# Patient Record
Sex: Female | Born: 1940 | Race: Black or African American | Hispanic: No | Marital: Single | Smoking: Former smoker
Health system: Southern US, Community
[De-identification: ages and names within clinical notes are randomized; demographics above are authoritative.]

## PROBLEM LIST (undated history)

## (undated) DIAGNOSIS — I639 Cerebral infarction, unspecified: Secondary | ICD-10-CM

## (undated) DIAGNOSIS — M1990 Unspecified osteoarthritis, unspecified site: Secondary | ICD-10-CM

## (undated) DIAGNOSIS — I10 Essential (primary) hypertension: Secondary | ICD-10-CM

## (undated) DIAGNOSIS — K219 Gastro-esophageal reflux disease without esophagitis: Secondary | ICD-10-CM

## (undated) DIAGNOSIS — G459 Transient cerebral ischemic attack, unspecified: Secondary | ICD-10-CM

## (undated) DIAGNOSIS — E785 Hyperlipidemia, unspecified: Secondary | ICD-10-CM

## (undated) DIAGNOSIS — E119 Type 2 diabetes mellitus without complications: Secondary | ICD-10-CM

## (undated) DIAGNOSIS — C801 Malignant (primary) neoplasm, unspecified: Secondary | ICD-10-CM

## (undated) DIAGNOSIS — I350 Nonrheumatic aortic (valve) stenosis: Secondary | ICD-10-CM

## (undated) DIAGNOSIS — R011 Cardiac murmur, unspecified: Secondary | ICD-10-CM

## (undated) HISTORY — DX: Nonrheumatic aortic (valve) stenosis: I35.0

## (undated) HISTORY — DX: Essential (primary) hypertension: I10

## (undated) HISTORY — DX: Transient cerebral ischemic attack, unspecified: G45.9

## (undated) HISTORY — PX: ABDOMINAL HYSTERECTOMY: SHX81

## (undated) HISTORY — DX: Type 2 diabetes mellitus without complications: E11.9

## (undated) HISTORY — PX: OTHER SURGICAL HISTORY: SHX169

## (undated) HISTORY — DX: Hyperlipidemia, unspecified: E78.5

## (undated) HISTORY — PX: COLON SURGERY: SHX602

## (undated) HISTORY — PX: TOTAL KNEE ARTHROPLASTY: SHX125

---

## 2008-10-17 ENCOUNTER — Emergency Department (HOSPITAL_COMMUNITY): Admission: EM | Admit: 2008-10-17 | Discharge: 2008-10-17 | Payer: Self-pay | Admitting: Emergency Medicine

## 2019-12-22 ENCOUNTER — Other Ambulatory Visit: Payer: Self-pay | Admitting: Internal Medicine

## 2019-12-22 DIAGNOSIS — N6459 Other signs and symptoms in breast: Secondary | ICD-10-CM

## 2019-12-29 ENCOUNTER — Other Ambulatory Visit: Payer: Self-pay | Admitting: Internal Medicine

## 2019-12-29 DIAGNOSIS — N6459 Other signs and symptoms in breast: Secondary | ICD-10-CM

## 2020-02-08 ENCOUNTER — Encounter: Payer: Self-pay | Admitting: Cardiology

## 2020-02-08 ENCOUNTER — Ambulatory Visit: Payer: Medicare Other | Admitting: Cardiology

## 2020-02-08 ENCOUNTER — Other Ambulatory Visit: Payer: Self-pay

## 2020-02-08 VITALS — BP 137/62 | HR 72 | Resp 16 | Ht 62.0 in | Wt 159.0 lb

## 2020-02-08 DIAGNOSIS — R0600 Dyspnea, unspecified: Secondary | ICD-10-CM

## 2020-02-08 DIAGNOSIS — I493 Ventricular premature depolarization: Secondary | ICD-10-CM

## 2020-02-08 DIAGNOSIS — Z87891 Personal history of nicotine dependence: Secondary | ICD-10-CM

## 2020-02-08 DIAGNOSIS — I350 Nonrheumatic aortic (valve) stenosis: Secondary | ICD-10-CM

## 2020-02-08 DIAGNOSIS — R0609 Other forms of dyspnea: Secondary | ICD-10-CM

## 2020-02-08 DIAGNOSIS — I10 Essential (primary) hypertension: Secondary | ICD-10-CM

## 2020-02-08 DIAGNOSIS — E782 Mixed hyperlipidemia: Secondary | ICD-10-CM

## 2020-02-08 DIAGNOSIS — Z8673 Personal history of transient ischemic attack (TIA), and cerebral infarction without residual deficits: Secondary | ICD-10-CM

## 2020-02-08 DIAGNOSIS — R072 Precordial pain: Secondary | ICD-10-CM

## 2020-02-08 DIAGNOSIS — E119 Type 2 diabetes mellitus without complications: Secondary | ICD-10-CM

## 2020-02-08 MED ORDER — LOSARTAN POTASSIUM 25 MG PO TABS: 25.0000 mg | ORAL_TABLET | Freq: Every morning | ORAL | 0 refills | Status: DC

## 2020-02-08 MED ORDER — METOPROLOL SUCCINATE ER 25 MG PO TB24: 25.0000 mg | ORAL_TABLET | Freq: Every day | ORAL | 0 refills | Status: DC

## 2020-02-08 NOTE — Progress Notes (Signed)
Date:  02/08/2020   ID:  Jacqueline Rich, DOB [birth year], MRN [id number]  PCP:  [name], MD  Cardiologist:  [name], DO, [facility] (established care 02/08/2020) Former Cardiology Providers: Dr. Sol Blazing [facility], [place])  REASON FOR CONSULT: Chest discomfort, aortic stenosis, dyspnea on exertion   REQUESTING PHYSICIAN:  [name], MD [address] [place],  [place] [address]  Chief Complaint  Patient presents with  . Chest Discomfort  . New Patient (Initial Visit)    Referred by r. Varadarajan  . Shortness of Breath    HPI  Jacqueline Rich is a 79 y.o. female who presents to the office with a chief complaint of " chest discomfort and shortness of breath." Patient's past medical history and cardiovascular risk factors include: Hypertension, type 2 diabetes without insulin use, hyperlipidemia, former smoker, TIA, family history of CHF, sedentary lifestyle.  She is referred to the office at the request of [name],* for evaluation of aortic stenosis, chest discomfort, dyspnea on exertion.   Patient presents today to establish cardiology care in [place] as she moved from [place] in March 2021. She moved to [facility] in order to be closer to her sisters.  She was reportedly being followed by cardiologist Dr. Sol Blazing.   Patient complains today of occasional substernal chest discomfort 1-2 times per week over the last month.  She rates this 1/10 pain and reports it comes on with regular activity last for a few seconds and is usually self-limited.  She was also recently seen by her primary care provider with concern of dyspnea on exertion.  Patient has no formal exercise routine but does state she experiences shortness of breath when she exerts herself beyond usual activity.  Denies syncope or near syncope.  Patient brings with her today and external echo report, without images, stating she has severe aortic stenosis.  According  to patient her previous cardiologist in [place] recommended evaluation for aortic valve surgery, however patient never followed up with a surgeon.   History of TIA according to patient in 2020 when she temporarily lost vision.   FUNCTIONAL STATUS: No structured exercise or daily routine.   ALLERGIES: No Known Allergies  MEDICATION LIST PRIOR TO VISIT: Current Meds  Medication Sig  . atorvastatin (LIPITOR) 10 MG tablet Take 1 tablet by mouth at bedtime.  Marland Kitchen DILT-XR 240 MG 24 hr capsule Take 240 mg by mouth daily.  Marland Kitchen glimepiride (AMARYL) 1 MG tablet Take 1 tablet by mouth daily.  . Lancets (ONETOUCH DELICA PLUS LANCET33G) MISC Apply 1 each topically daily.  . lansoprazole (PREVACID) 30 MG capsule Take 30 mg by mouth daily.  Letta Pate ULTRA test strip 1 each daily.  . potassium chloride SA (KLOR-CON) 20 MEQ tablet Take 20 mEq by mouth daily.  . [DISCONTINUED] hydrochlorothiazide (HYDRODIURIL) 25 MG tablet Take 1 tablet by mouth daily.  . [DISCONTINUED] labetalol (NORMODYNE) 100 MG tablet Take 1 tablet by mouth daily.     PAST MEDICAL HISTORY: Past Medical History:  Diagnosis Date  . Aortic stenosis   . Diabetes mellitus without complication (HCC)   . Hyperlipidemia   . Hypertension   . TIA (transient ischemic attack)     PAST SURGICAL HISTORY: History reviewed. No pertinent surgical history.  FAMILY HISTORY: The patient family history includes Cancer in her brother; Cancer - Colon in her mother; Cancer - Lung in her brother; Heart attack in her brother; Hypertension in her brother, father, and sister.  SOCIAL HISTORY:  The patient  reports that she quit smoking about 31 years ago. Her smoking use included cigarettes. She has never used smokeless tobacco. She reports current alcohol use of about 2.0 standard drinks of alcohol per week. She reports that she does not use drugs.  REVIEW OF SYSTEMS: Review of Systems  Constitutional: Negative for malaise/fatigue.   Cardiovascular: Positive for chest pain, dyspnea on exertion and palpitations. Negative for leg swelling, near-syncope, orthopnea, paroxysmal nocturnal dyspnea and syncope.  Respiratory: Negative for snoring.   Hematologic/Lymphatic: Negative for bleeding problem. Does not bruise/bleed easily.  Gastrointestinal: Negative for melena.    PHYSICAL EXAM: Vitals with BMI 02/08/2020  Height 5\' 2"   Weight 159 lbs  BMI 29.07  Systolic 137  Diastolic 62  Pulse 72    CONSTITUTIONAL: Well-developed and well-nourished. No acute distress.  SKIN: Skin is warm and dry. No rash noted. No cyanosis. No pallor. No jaundice HEAD: Normocephalic and atraumatic.  EYES: No scleral icterus MOUTH/THROAT: Moist oral membranes.  NECK: No JVD present. No thyromegaly noted. No carotid bruits  LYMPHATIC: No visible cervical adenopathy.  CHEST Normal respiratory effort. No intercostal retractions  LUNGS: Clear to auscultation bilaterally. No stridor. No wheezes. No rales.  CARDIOVASCULAR: Normal rate and rhythm. Positive S1, inaudible S2 with harsh systolic murmur right second intercostal space, Systolic murmur at apex radiating to axilla 3/6.  Without gallops or rubs.  Distal pulses intact.  ABDOMINAL: Nonobese, soft, nontender, nondistended, positive bowel sounds in all 4 quadrants no apparent ascites.  EXTREMITIES: No peripheral edema, warm to touch bilaterally, 2+ dorsalis pedis and posterior tibial pulses HEMATOLOGIC: No significant bruising NEUROLOGIC: Oriented to person, place, and time. Nonfocal. Normal muscle tone.  PSYCHIATRIC: Normal mood and affect. Normal behavior. Cooperative  CARDIAC DATABASE: EKG: 02/08/2020: Normal sinus rhythm, 72 bpm, normal axis, poor R wave progression, without underlying ischemia or injury pattern, left atrial enlargement, occasional PVCs in a trigeminal pattern.  No prior ECGs for comparison.  Echocardiogram: 05/05/2019 (external):  Moderate calcification of the aortic  valve with severe stenosis, peak velocity 3.49 m/s, peak gradient 49 mmHg, valve area 0.76 sq cm.  Mild calcification of mitral annulus with mild insufficiency. Trace aortic, pulmonic, tricuspid insufficiency Normal LV size and function, EF 55%  Stress Testing: No results found for this or any previous visit from the past 1095 days.  Heart Catheterization: None  Carotid duplex: 05/05/2019 (external): No hemodynamically significant stenosis or aberrant flow on either side, normal antegrade flow bilaterally.  LABORATORY DATA: No flowsheet data found.  No flowsheet data found.  Lipid Panel  No results found for: CHOL, TRIG, HDL, CHOLHDL, VLDL, LDLCALC, LDLDIRECT, LABVLDL  No components found for: NTPROBNP No results for input(s): PROBNP in the last 8760 hours. No results for input(s): TSH in the last 8760 hours.  BMP No results for input(s): NA, K, CL, CO2, GLUCOSE, BUN, CREATININE, CALCIUM, GFRNONAA, GFRAA in the last 8760 hours.  HEMOGLOBIN A1C No results found for: HGBA1C, MPG  IMPRESSION:    ICD-10-CM   1. Precordial chest pain  R07.2 EKG 12-Lead  2. Dyspnea on exertion  R06.00 Basic metabolic panel    Pro b natriuretic peptide (BNP)    Hemoglobin and hematocrit, blood  3. Nonrheumatic aortic valve stenosis  I35.0 ECHOCARDIOGRAM COMPLETE    losartan (COZAAR) 25 MG tablet    metoprolol succinate (TOPROL XL) 25 MG 24 hr tablet  4. PVC (premature ventricular contraction)  I49.3   5. Benign hypertension  I10 losartan (COZAAR) 25 MG tablet  metoprolol succinate (TOPROL XL) 25 MG 24 hr tablet  6. Type 2 diabetes mellitus without complication, without long-term current use of insulin (HCC)  E11.9   7. Mixed hyperlipidemia  E78.2   8. Hx of TIA (transient ischemic attack) and stroke  Z86.73   9. Former smoker  Z87.891      RECOMMENDATIONS: Jacqueline QuinonesFannie L Rich is a 79 y.o. female whose past medical history and cardiac risk factors include: Hypertension, type 2 diabetes  without insulin use, hyperlipidemia, former smoker, TIA, family history of CHF, sedentary lifestyle.  Nonrheumatic aortic valve stenosis  Patient presents today to establish cardiac care here in [place], she brings with her an external echo report, no images for me to review independently, reports states she has severe aortic stenosis.    Recommend echocardiogram to evaluate LVEF, diastolic function, and severity of aortic stenosis.    Her symptoms of dyspnea on exertion are suggestive to be secondary to her underlying valvular heart disease.  Further recommendations to follow.    She denies anginal chest pain, overall euvolemic and not in congestive heart failure, and denies syncope.    Discontinue diuretic therapy, hydrochlorothiazide.    Given her underlying PVCs discontinue labetalol and transition to metoprolol succinate 25 mg p.o. daily   Patient is asked to seek medical attention sooner by going to the closest ER via EMS if she has symptoms of typical chest pain, heart failure symptoms as discussed at today's office visit, or syncope.    If she truly has severe aortic stenosis by echocardiography she may benefit from being evaluated by structural heart team for possible TAVR versus SAVR.  Dyspnea on exertion  Most likely secondary to her underlying aortic stenosis.  Holding off on ischemic evaluation as patient will be preserved better with left heart catheterization to evaluate for obstructive CAD if she truly has underlying severe AS.  We will check BMP, BNP, hemoglobin and hematocrit as potential etiologies of her underlying dyspnea.  Premature ventricular contraction  Patient's EKG today revealed frequent premature ventricular contractions and trigeminy pattern.  Will stop labetalol and switch her to metoprolol succinate 25 mg daily.  This is likely to better control her PVCs.  Benign hypertension  Office blood pressure is within acceptable range.  Currently managed  by primary care provider..  Given her underlying aortic stenosis would recommend discontinuation of hydrochlorothiazide and transitioning her to losartan which would help with both preload and afterload.    Precordial chest pain  EKG shows normal sinus rhythm without underlying ischemia or injury pattern.  Patient symptoms appear to be atypical.  Depending on the results of the echocardiogram we will proceed with either left heart catheterization or stress test at the next visit.  These recommendations were discussed with great detail with the patient at today's office visit.  Patient is in agreement with the plan of care as discussed above.  FINAL MEDICATION LIST END OF ENCOUNTER: Meds ordered this encounter  Medications  . losartan (COZAAR) 25 MG tablet    Sig: Take 1 tablet (25 mg total) by mouth in the morning.    Dispense:  90 tablet    Refill:  0  . metoprolol succinate (TOPROL XL) 25 MG 24 hr tablet    Sig: Take 1 tablet (25 mg total) by mouth daily.    Dispense:  90 tablet    Refill:  0    Medications Discontinued During This Encounter  Medication Reason  . hydrochlorothiazide (HYDRODIURIL) 25 MG tablet Discontinued by provider  .  labetalol (NORMODYNE) 100 MG tablet Discontinued by provider     Current Outpatient Medications:  .  atorvastatin (LIPITOR) 10 MG tablet, Take 1 tablet by mouth at bedtime., Disp: , Rfl:  .  DILT-XR 240 MG 24 hr capsule, Take 240 mg by mouth daily., Disp: , Rfl:  .  glimepiride (AMARYL) 1 MG tablet, Take 1 tablet by mouth daily., Disp: , Rfl:  .  Lancets (ONETOUCH DELICA PLUS LANCET33G) MISC, Apply 1 each topically daily., Disp: , Rfl:  .  lansoprazole (PREVACID) 30 MG capsule, Take 30 mg by mouth daily., Disp: , Rfl:  .  ONETOUCH ULTRA test strip, 1 each daily., Disp: , Rfl:  .  potassium chloride SA (KLOR-CON) 20 MEQ tablet, Take 20 mEq by mouth daily., Disp: , Rfl:  .  losartan (COZAAR) 25 MG tablet, Take 1 tablet (25 mg total) by mouth in  the morning., Disp: 90 tablet, Rfl: 0 .  metoprolol succinate (TOPROL XL) 25 MG 24 hr tablet, Take 1 tablet (25 mg total) by mouth daily., Disp: 90 tablet, Rfl: 0  Orders Placed This Encounter  Procedures  . Basic metabolic panel  . Pro b natriuretic peptide (BNP)  . Hemoglobin and hematocrit, blood  . EKG 12-Lead  . ECHOCARDIOGRAM COMPLETE    There are no Patient Instructions on file for this visit.   --Continue cardiac medications as reconciled in final medication list. --Return in about 2 weeks (around 02/22/2020) for Reevaluation of AS and review echo . Or sooner if needed. --Continue follow-up with your primary care physician regarding the management of your other chronic comorbid conditions.  Patient's questions and concerns were addressed to her satisfaction. She voices understanding of the instructions provided during this encounter.   This note was created using a voice recognition software as a result there may be grammatical errors inadvertently enclosed that do not reflect the nature of this encounter. Every attempt is made to correct such errors.  Total time spent: 45 minutes discussing symptoms, disease management, reviewing outside records, medication reconciliation, coordination of care.  [name], [place], [facility]  Pager: [phone] Office: [phone]

## 2020-02-09 ENCOUNTER — Other Ambulatory Visit: Payer: Self-pay | Admitting: Cardiology

## 2020-02-09 DIAGNOSIS — R0609 Other forms of dyspnea: Secondary | ICD-10-CM

## 2020-02-09 DIAGNOSIS — R0600 Dyspnea, unspecified: Secondary | ICD-10-CM

## 2020-02-09 LAB — BASIC METABOLIC PANEL
BUN/Creatinine Ratio: 10 — ABNORMAL LOW (ref 12–28)
BUN: 8 mg/dL (ref 8–27)
CO2: 24 mmol/L (ref 20–29)
Calcium: 9.7 mg/dL (ref 8.7–10.3)
Chloride: 104 mmol/L (ref 96–106)
Creatinine, Ser: 0.79 mg/dL (ref 0.57–1.00)
GFR calc Af Amer: 83 mL/min/{1.73_m2} (ref >59)
GFR calc non Af Amer: 72 mL/min/{1.73_m2} (ref >59)
Glucose: 101 mg/dL — ABNORMAL HIGH (ref 65–99)
Potassium: 4 mmol/L (ref 3.5–5.2)
Sodium: 136 mmol/L (ref 134–144)

## 2020-02-09 LAB — PRO B NATRIURETIC PEPTIDE: NT-Pro BNP: 144 pg/mL (ref 0–738)

## 2020-02-09 LAB — HEMOGLOBIN AND HEMATOCRIT, BLOOD
Hematocrit: 37.6 % (ref 34.0–46.6)
Hemoglobin: 12.6 g/dL (ref 11.1–15.9)

## 2020-02-09 NOTE — Progress Notes (Signed)
Called pt to inform her about her lab results, and to repeat labs in aw week. Pt understood

## 2020-02-11 ENCOUNTER — Ambulatory Visit (HOSPITAL_COMMUNITY)
Admission: RE | Admit: 2020-02-11 | Discharge: 2020-02-11 | Disposition: A | Discharge status: Home / Self Care | Payer: Medicare Other | Source: Ambulatory Visit | Attending: Cardiology | Admitting: Cardiology

## 2020-02-11 ENCOUNTER — Other Ambulatory Visit: Payer: Self-pay

## 2020-02-11 DIAGNOSIS — I350 Nonrheumatic aortic (valve) stenosis: Secondary | ICD-10-CM | POA: Insufficient documentation

## 2020-02-11 NOTE — Progress Notes (Signed)
  Echocardiogram 2D Echocardiogram has been performed.  Jacqueline Rich 02/11/2020, 12:13 PM

## 2020-02-16 ENCOUNTER — Other Ambulatory Visit: Payer: Self-pay | Admitting: Internal Medicine

## 2020-02-16 ENCOUNTER — Telehealth: Payer: Self-pay

## 2020-02-16 DIAGNOSIS — R928 Other abnormal and inconclusive findings on diagnostic imaging of breast: Secondary | ICD-10-CM

## 2020-02-16 LAB — BASIC METABOLIC PANEL
BUN/Creatinine Ratio: 13 (ref 12–28)
BUN: 11 mg/dL (ref 8–27)
CO2: 23 mmol/L (ref 20–29)
Calcium: 9.7 mg/dL (ref 8.7–10.3)
Chloride: 102 mmol/L (ref 96–106)
Creatinine, Ser: 0.85 mg/dL (ref 0.57–1.00)
GFR calc Af Amer: 76 mL/min/{1.73_m2} (ref >59)
GFR calc non Af Amer: 66 mL/min/{1.73_m2} (ref >59)
Glucose: 126 mg/dL — ABNORMAL HIGH (ref 65–99)
Potassium: 4 mmol/L (ref 3.5–5.2)
Sodium: 140 mmol/L (ref 134–144)

## 2020-02-16 LAB — MAGNESIUM: Magnesium: 1.8 mg/dL (ref 1.6–2.3)

## 2020-02-16 NOTE — Telephone Encounter (Signed)
Spoke with patient in regards to lab results. Patient voiced understanding and will be able to attend upcoming appt

## 2020-02-16 NOTE — Telephone Encounter (Signed)
-----   Message from [place], [place] sent at 02/16/2020  9:11 AM EDT ----- Continue losartan as prescribed.Please have her bring a log of her blood pressure for the next visit. Results reviewed.Serum potassium and magnesium levels are within normal limits.Kidney function is relatively at baseline.Continue current medical therapy.Call if questions arise.

## 2020-02-23 ENCOUNTER — Ambulatory Visit: Payer: Medicare Other | Admitting: Cardiology

## 2020-02-23 ENCOUNTER — Encounter: Payer: Self-pay | Admitting: Cardiology

## 2020-02-23 ENCOUNTER — Other Ambulatory Visit: Payer: Self-pay

## 2020-02-23 VITALS — BP 132/53 | HR 49 | Resp 16 | Ht 62.0 in | Wt 158.0 lb

## 2020-02-23 DIAGNOSIS — E119 Type 2 diabetes mellitus without complications: Secondary | ICD-10-CM

## 2020-02-23 DIAGNOSIS — I350 Nonrheumatic aortic (valve) stenosis: Secondary | ICD-10-CM

## 2020-02-23 DIAGNOSIS — I10 Essential (primary) hypertension: Secondary | ICD-10-CM

## 2020-02-23 DIAGNOSIS — E782 Mixed hyperlipidemia: Secondary | ICD-10-CM

## 2020-02-23 DIAGNOSIS — Z87891 Personal history of nicotine dependence: Secondary | ICD-10-CM

## 2020-02-23 DIAGNOSIS — I493 Ventricular premature depolarization: Secondary | ICD-10-CM

## 2020-02-23 DIAGNOSIS — Z8673 Personal history of transient ischemic attack (TIA), and cerebral infarction without residual deficits: Secondary | ICD-10-CM

## 2020-02-23 LAB — ECHOCARDIOGRAM COMPLETE
AR max vel: 0.81 cm2
AV Area VTI: 0.72 cm2
AV Area mean vel: 0.72 cm2
AV Mean grad: 36 mmHg
AV Peak grad: 60.8 mmHg
Ao pk vel: 3.9 m/s
Area-P 1/2: 3.77 cm2
Calc EF: 62 %
S' Lateral: 2.3 cm
Single Plane A2C EF: 70 %
Single Plane A4C EF: 52.6 %

## 2020-02-23 NOTE — Progress Notes (Signed)
Date:  02/23/2020   ID:  Jacqueline Rich, DOB 16-Oct-1940, MRN [id number]  PCP:  [name], MD  Cardiologist:  [name], DO, [facility] (established care 02/08/2020) Former Cardiology Providers: Dr. Sol Blazing [facility], [place])  Date: 02/23/20 Last Office Visit: 02/08/2020  Chief Complaint  Patient presents with  . Follow-up    2 weeks   . Results    Echo    HPI  Jacqueline Rich is a 79 y.o. female who presents to the office with a chief complaint of " discussed management for aortic stenosis overview echo results." Patient's past medical history and cardiovascular risk factors include: Aortic stenosis, hypertension, type 2 diabetes without insulin use, hyperlipidemia, former smoker, TIA.  She is referred to the office at the request of [name],* for evaluation of aortic stenosis, chest discomfort, dyspnea on exertion.  Patient is accompanied by her sister Jacqueline Rich (phone # [phone]).   Patient recently moved from [place] to [place] in March 2021.  She was referred to cardiology recently for management of aortic stenosis, chest discomfort and dyspnea on exertion.  At the last office visit she stated that she has underlying aortic stenosis and wanted to reestablish care with cardiology.  She also mentions occasional substernal chest discomfort once or twice a week for the last 1 month, intensity 1 out of 10, brought on by effort related activities, lasting for a few seconds, usually self-limited.  Since last office visit her symptoms of chest pain have resolved.  She denies any syncope or near syncopal events.  She remains euvolemic and not in congestive heart failure.    At the last office visit she was recommended to undergo echocardiogram to further evaluate the severity of aortic stenosis. In addition, at the last office visit we discontinued diuretic therapy given her underlying aortic stenosis.  And transitioned her from labetalol to  metoprolol given her underlying premature ventricular contractions. She presents for her 2-week follow-up to discuss disease management.  Since her last office visit patient denies any reoccurrence of chest pain, dyspnea mutation, or symptoms or syncope/near syncope.  She did have an echocardiogram done at [facility] which noted severe aortic stenosis with a peak velocity of 3.9 m/s, mean gradient of 36 mmHg, dimensional index of 0.22.  FUNCTIONAL STATUS: No structured exercise or daily routine.   ALLERGIES: No Known Allergies  MEDICATION LIST PRIOR TO VISIT: Current Meds  Medication Sig  . atorvastatin (LIPITOR) 10 MG tablet Take 1 tablet by mouth at bedtime.  Marland Kitchen DILT-XR 240 MG 24 hr capsule Take 240 mg by mouth daily.  Marland Kitchen glimepiride (AMARYL) 1 MG tablet Take 1 tablet by mouth daily.  . Lancets (ONETOUCH DELICA PLUS LANCET33G) MISC Apply 1 each topically daily.  . lansoprazole (PREVACID) 30 MG capsule Take 30 mg by mouth daily.  Marland Kitchen losartan (COZAAR) 25 MG tablet Take 1 tablet (25 mg total) by mouth in the morning.  . metoprolol succinate (TOPROL XL) 25 MG 24 hr tablet Take 1 tablet (25 mg total) by mouth daily.  Letta Pate ULTRA test strip 1 each daily.  . potassium chloride SA (KLOR-CON) 20 MEQ tablet Take 20 mEq by mouth daily.     PAST MEDICAL HISTORY: Past Medical History:  Diagnosis Date  . Aortic stenosis   . Diabetes mellitus without complication (HCC)   . Hyperlipidemia   . Hypertension   . TIA (transient ischemic attack)     PAST SURGICAL HISTORY: History reviewed. No pertinent surgical history.  FAMILY HISTORY: The patient family history includes Cancer in her brother; Cancer - Colon in her mother; Cancer - Lung in her brother; Heart attack in her brother; Hypertension in her brother, father, and sister.  SOCIAL HISTORY:  The patient  reports that she quit smoking about 31 years ago. Her smoking use included cigarettes. She has never used smokeless tobacco.  She reports current alcohol use of about 2.0 standard drinks of alcohol per week. She reports that she does not use drugs.  REVIEW OF SYSTEMS: Review of Systems  Constitutional: Negative for malaise/fatigue.  Cardiovascular: Negative for chest pain, dyspnea on exertion, leg swelling, near-syncope, orthopnea, palpitations, paroxysmal nocturnal dyspnea and syncope.  Respiratory: Negative for snoring.   Hematologic/Lymphatic: Negative for bleeding problem. Does not bruise/bleed easily.  Gastrointestinal: Negative for melena.    PHYSICAL EXAM: Vitals with BMI 02/23/2020 02/08/2020  Height 5\' 2"  5\' 2"   Weight 158 lbs 159 lbs  BMI 28.89 29.07  Systolic 132 137  Diastolic 53 62  Pulse 49 72    CONSTITUTIONAL: Well-developed and well-nourished. No acute distress.  SKIN: Skin is warm and dry. No rash noted. No cyanosis. No pallor. No jaundice HEAD: Normocephalic and atraumatic.  EYES: No scleral icterus MOUTH/THROAT: Moist oral membranes.  NECK: No JVD present. No thyromegaly noted. No carotid bruits  LYMPHATIC: No visible cervical adenopathy.  CHEST Normal respiratory effort. No intercostal retractions  LUNGS: Clear to auscultation bilaterally. No stridor. No wheezes. No rales.  CARDIOVASCULAR: Normal rate and rhythm. Positive S1, inaudible S2 with harsh systolic murmur right second intercostal space, Systolic murmur at apex radiating to axilla 3/6.  Without gallops or rubs.  Distal pulses intact.  ABDOMINAL: Nonobese, soft, nontender, nondistended, positive bowel sounds in all 4 quadrants no apparent ascites.  EXTREMITIES: No peripheral edema, warm to touch bilaterally, 2+ dorsalis pedis and posterior tibial pulses HEMATOLOGIC: No significant bruising NEUROLOGIC: Oriented to person, place, and time. Nonfocal. Normal muscle tone.  PSYCHIATRIC: Normal mood and affect. Normal behavior. Cooperative  CARDIAC DATABASE: EKG: 02/08/2020: Normal sinus rhythm, 72 bpm, normal axis, poor R wave  progression, without underlying ischemia or injury pattern, left atrial enlargement, occasional PVCs in a trigeminal pattern.  No prior ECGs for comparison.  Echocardiogram: 05/05/2019 (external):  Moderate calcification of the aortic valve with severe stenosis, peak velocity 3.49 m/s, peak gradient 49 mmHg, valve area 0.76 sq cm.  Mild calcification of mitral annulus with mild insufficiency. Trace aortic, pulmonic, tricuspid insufficiency Normal LV size and function, EF 55%  02/11/2020: Left ventricular ejection fraction, by estimation, is 55 to 60%. The  left ventricle has normal function. The left ventricle has no regional wall motion abnormalities. There is mild concentric left ventricular hypertrophy. Left ventricular diastolic  parameters are indeterminate. Elevated left atrial pressure. The average left ventricular global longitudinal strain is -17.5 %.  Right ventricular systolic function is low normal. The right ventricular size is normal. There is normal pulmonary artery systolic pressure.  The mitral valve is degenerative. Trivial mitral valve regurgitation. No evidence of mitral stenosis.  The aortic valve is tricuspid. There is severe calcifcation of the aortic valve. There is severe thickening of the aortic valve. Aortic valve regurgitation is trivial. Severe aortic valve stenosis. Aortic valve area, by VTI measures 0.72 cm. Aortic  valve mean gradient measures 36.0 mmHg. Aortic valve Vmax measures 3.90 m/s. Dimensionless index 0.22.  Stress Testing: No results found for this or any previous visit from the past 1095 days.  Heart Catheterization: None  Carotid duplex:  05/05/2019 (external): No hemodynamically significant stenosis or aberrant flow on either side, normal antegrade flow bilaterally.  LABORATORY DATA: CBC Latest Ref Rng & Units 02/08/2020  Hemoglobin 11.1 - 15.9 g/dL 45.412.6  Hematocrit 09.834.0 - 46.6 % 37.6    CMP Latest Ref Rng & Units 02/15/2020 02/08/2020    Glucose 65 - 99 mg/dL 119(J126(H) 478(G101(H)  BUN 8 - 27 mg/dL 11 8  Creatinine 9.560.57 - 1.00 mg/dL 2.130.85 0.860.79  Sodium 578134 - 144 mmol/L 140 136  Potassium 3.5 - 5.2 mmol/L 4.0 4.0  Chloride 96 - 106 mmol/L 102 104  CO2 20 - 29 mmol/L 23 24  Calcium 8.7 - 10.3 mg/dL 9.7 9.7    Lipid Panel  No results found for: CHOL, TRIG, HDL, CHOLHDL, VLDL, LDLCALC, LDLDIRECT, LABVLDL  No components found for: NTPROBNP Recent Labs    02/08/20 1309  PROBNP 144   No results for input(s): TSH in the last 8760 hours.  BMP Recent Labs    02/08/20 1309 02/15/20 0957  NA 136 140  K 4.0 4.0  CL 104 102  CO2 24 23  GLUCOSE 101* 126*  BUN 8 11  CREATININE 0.79 0.85  CALCIUM 9.7 9.7  GFRNONAA 72 66  GFRAA 83 76    HEMOGLOBIN A1C No results found for: HGBA1C, MPG  IMPRESSION:    ICD-10-CM   1. Nonrheumatic aortic valve stenosis  I35.0   2. PVC (premature ventricular contraction)  I49.3   3. Benign hypertension  I10   4. Type 2 diabetes mellitus without complication, without long-term current use of insulin (HCC)  E11.9   5. Mixed hyperlipidemia  E78.2   6. Hx of TIA (transient ischemic attack) and stroke  Z86.73   7. Former smoker  Z87.891      RECOMMENDATIONS: Jacqueline QuinonesFannie L Rich is a 79 y.o. female whose past medical history and cardiac risk factors include: Hypertension, type 2 diabetes without insulin use, hyperlipidemia, former smoker, TIA, family history of CHF, sedentary lifestyle.  Nonrheumatic aortic valve stenosis  States that she no longer experiences chest pain or dyspnea on exertion.  She stated that the chest pain that she was experiencing was more musculoskeletal in her breast tissue.  She denies angina pectoris, congestive heart, or syncope.  Reviewed the most recent echocardiogram results with both the patient her sister at today's office visit.  Shared decision was to reevaluate in 1 month and during that time she was encouraged to increase her physical activity as  tolerated.  Premature ventricular contraction: Asymptomatic.  Continue metoprolol succinate.  Continue to monitor.  Benign hypertension  Office blood pressure is within acceptable range.   Currently managed by primary care provider.  Patient has tolerated transition from hydrochlorothiazide to losartan.  Repeat blood work 1 week later notes stable kidney function and electrolytes.  Most recent labs from 02/15/2020 independently reviewed with her at today's visit.  Based on the initial consultation it seemed the patient was symptomatic from her underlying aortic stenosis as she was having effort related dyspnea & precordial chest pain.  Therefore recommended that she increase her physical activity as tolerated to see if she has effort related dyspnea or chest pain.  If she does she is asked to come back for a return visit sooner than her scheduled appointment.  She verbalized understanding and provides verbal feedback.  FINAL MEDICATION LIST END OF ENCOUNTER: No orders of the defined types were placed in this encounter.   There are no discontinued medications.   Current Outpatient  Medications:  .  atorvastatin (LIPITOR) 10 MG tablet, Take 1 tablet by mouth at bedtime., Disp: , Rfl:  .  DILT-XR 240 MG 24 hr capsule, Take 240 mg by mouth daily., Disp: , Rfl:  .  glimepiride (AMARYL) 1 MG tablet, Take 1 tablet by mouth daily., Disp: , Rfl:  .  Lancets (ONETOUCH DELICA PLUS LANCET33G) MISC, Apply 1 each topically daily., Disp: , Rfl:  .  lansoprazole (PREVACID) 30 MG capsule, Take 30 mg by mouth daily., Disp: , Rfl:  .  losartan (COZAAR) 25 MG tablet, Take 1 tablet (25 mg total) by mouth in the morning., Disp: 90 tablet, Rfl: 0 .  metoprolol succinate (TOPROL XL) 25 MG 24 hr tablet, Take 1 tablet (25 mg total) by mouth daily., Disp: 90 tablet, Rfl: 0 .  ONETOUCH ULTRA test strip, 1 each daily., Disp: , Rfl:  .  potassium chloride SA (KLOR-CON) 20 MEQ tablet, Take 20 mEq by mouth daily.,  Disp: , Rfl:   No orders of the defined types were placed in this encounter.   There are no Patient Instructions on file for this visit.   --Continue cardiac medications as reconciled in final medication list. --Return in about 4 weeks (around 03/22/2020) for Reevaluation of aortic stenosis. . Or sooner if needed. --Continue follow-up with your primary care physician regarding the management of your other chronic comorbid conditions.  Patient's questions and concerns were addressed to her satisfaction. She voices understanding of the instructions provided during this encounter.   This note was created using a voice recognition software as a result there may be grammatical errors inadvertently enclosed that do not reflect the nature of this encounter. Every attempt is made to correct such errors.  Total time spent: 32 minutes.  [name], [place], [facility]  Pager: [phone] Office: [phone]

## 2020-03-01 DIAGNOSIS — I350 Nonrheumatic aortic (valve) stenosis: Secondary | ICD-10-CM | POA: Insufficient documentation

## 2020-03-01 NOTE — Addendum Note (Signed)
Encounter addended by: [name], DO on: 03/01/2020 2:53 PM  Actions taken: Problem List modified, Charge Capture section accepted

## 2020-03-16 ENCOUNTER — Other Ambulatory Visit: Payer: Self-pay

## 2020-03-16 ENCOUNTER — Ambulatory Visit: Payer: Medicare Other

## 2020-03-16 ENCOUNTER — Ambulatory Visit
Admission: RE | Admit: 2020-03-16 | Discharge: 2020-03-16 | Disposition: A | Discharge status: Home / Self Care | Payer: Medicare Other | Source: Ambulatory Visit | Attending: Internal Medicine | Admitting: Internal Medicine

## 2020-03-16 ENCOUNTER — Other Ambulatory Visit: Payer: Self-pay | Admitting: Internal Medicine

## 2020-03-16 DIAGNOSIS — R928 Other abnormal and inconclusive findings on diagnostic imaging of breast: Secondary | ICD-10-CM

## 2020-03-16 DIAGNOSIS — N6489 Other specified disorders of breast: Secondary | ICD-10-CM

## 2020-03-21 ENCOUNTER — Ambulatory Visit: Payer: Medicare Other | Admitting: Cardiology

## 2020-03-21 ENCOUNTER — Other Ambulatory Visit: Payer: Self-pay

## 2020-03-21 ENCOUNTER — Encounter: Payer: Self-pay | Admitting: Cardiology

## 2020-03-21 VITALS — BP 137/70 | HR 68 | Ht 62.0 in | Wt 161.0 lb

## 2020-03-21 DIAGNOSIS — Z8673 Personal history of transient ischemic attack (TIA), and cerebral infarction without residual deficits: Secondary | ICD-10-CM

## 2020-03-21 DIAGNOSIS — Z87891 Personal history of nicotine dependence: Secondary | ICD-10-CM

## 2020-03-21 DIAGNOSIS — I350 Nonrheumatic aortic (valve) stenosis: Secondary | ICD-10-CM

## 2020-03-21 DIAGNOSIS — I493 Ventricular premature depolarization: Secondary | ICD-10-CM

## 2020-03-21 DIAGNOSIS — I10 Essential (primary) hypertension: Secondary | ICD-10-CM

## 2020-03-21 DIAGNOSIS — E782 Mixed hyperlipidemia: Secondary | ICD-10-CM

## 2020-03-21 DIAGNOSIS — E119 Type 2 diabetes mellitus without complications: Secondary | ICD-10-CM

## 2020-03-21 NOTE — Progress Notes (Addendum)
Date:  03/21/2020   ID:  Jacqueline Rich, DOB [birth year], MRN [id number]  PCP:  [name], MD  Cardiologist:  [name], DO, [facility] (established care 02/08/2020) Former Cardiology Providers: Dr. Sol Blazing [facility], [place])  Date: 03/21/20 Last Office Visit: 02/23/2020  Chief Complaint  Patient presents with  . Aortic Stenosis  . Follow-up    HPI  Jacqueline Rich is a 79 y.o. female who presents to the office with a chief complaint of " management of aortic stenosis." Patient's past medical history and cardiovascular risk factors include: Aortic stenosis, hypertension, type 2 diabetes without insulin use, hyperlipidemia, former smoker, TIA.  She is referred to the office at the request of [name],* for evaluation of aortic stenosis, chest discomfort, dyspnea on exertion.   Patient recently moved from [place] to [place] in March 2021.  She was referred to cardiology recently for management of aortic stenosis, chest discomfort and dyspnea on exertion.   Since establishing care patient's pharmacological therapy has been changed which has helped her symptoms of chest discomfort and dyspnea on exertion.  I had discontinued her diuretic therapy and transition her to losartan.  And her labetalol was transitioned to metoprolol.  Patient has tolerated medication changes well.  She had a repeat echocardiogram at [facility] which notes severe aortic stenosis per hemodynamics (peak velocity of 3.9 m/s, mean gradient of 36 mmHg, dimensional index of 0.22).  Her left ventricular ejection fraction remained stable and clinically patient overall is asymptomatic.  At last office visit the plan was to increase physical activity to see if she becomes symptomatic.  Patient states that since last office visit she walks twice a week at least 30 minutes each time and ambulates for 0.5 miles.  During which time she does not have effort related chest pain, dyspnea  on exertion, lightheadedness, dizziness, or feeling like passing out.  FUNCTIONAL STATUS: Walks twice a week about 30 minutes each time and walk  between 0.25 - 0.5 miles.   ALLERGIES: No Known Allergies  MEDICATION LIST PRIOR TO VISIT: Current Meds  Medication Sig  . atorvastatin (LIPITOR) 10 MG tablet Take 1 tablet by mouth at bedtime.  Marland Kitchen DILT-XR 240 MG 24 hr capsule Take 240 mg by mouth daily.  Marland Kitchen glimepiride (AMARYL) 1 MG tablet Take 1 tablet by mouth daily.  . Lancets (ONETOUCH DELICA PLUS LANCET33G) MISC Apply 1 each topically daily.  . lansoprazole (PREVACID) 30 MG capsule Take 30 mg by mouth daily.  Marland Kitchen losartan (COZAAR) 25 MG tablet Take 1 tablet (25 mg total) by mouth in the morning.  . metoprolol succinate (TOPROL XL) 25 MG 24 hr tablet Take 1 tablet (25 mg total) by mouth daily.  Letta Pate ULTRA test strip 1 each daily.  . potassium chloride SA (KLOR-CON) 20 MEQ tablet Take 20 mEq by mouth daily.     PAST MEDICAL HISTORY: Past Medical History:  Diagnosis Date  . Aortic stenosis   . Diabetes mellitus without complication (HCC)   . Hyperlipidemia   . Hypertension   . TIA (transient ischemic attack)     PAST SURGICAL HISTORY: History reviewed. No pertinent surgical history.  FAMILY HISTORY: The patient family history includes Cancer in her brother; Cancer - Colon in her mother; Cancer - Lung in her brother; Heart attack in her brother; Hypertension in her brother, father, and sister.  SOCIAL HISTORY:  The patient  reports that she quit smoking about 31 years ago. Her smoking use included cigarettes.  She has never used smokeless tobacco. She reports current alcohol use of about 2.0 standard drinks of alcohol per week. She reports that she does not use drugs.  REVIEW OF SYSTEMS: Review of Systems  Constitutional: Negative for malaise/fatigue.  Cardiovascular: Negative for chest pain, dyspnea on exertion, leg swelling, near-syncope, orthopnea, palpitations, paroxysmal  nocturnal dyspnea and syncope.  Respiratory: Negative for snoring.   Hematologic/Lymphatic: Negative for bleeding problem. Does not bruise/bleed easily.  Gastrointestinal: Negative for melena.    PHYSICAL EXAM: Vitals with BMI 03/21/2020 02/23/2020 02/08/2020  Height 5\' 2"  5\' 2"  5\' 2"   Weight 161 lbs 158 lbs 159 lbs  BMI 29.44 28.89 29.07  Systolic 137 132 161137  Diastolic 70 53 62  Pulse 68 49 72    CONSTITUTIONAL: Well-developed and well-nourished. No acute distress.  SKIN: Skin is warm and dry. No rash noted. No cyanosis. No pallor. No jaundice HEAD: Normocephalic and atraumatic.  EYES: No scleral icterus MOUTH/THROAT: Moist oral membranes.  NECK: No JVD present. No thyromegaly noted. No carotid bruits  LYMPHATIC: No visible cervical adenopathy.  CHEST Normal respiratory effort. No intercostal retractions  LUNGS: Clear to auscultation bilaterally. No stridor. No wheezes. No rales.  CARDIOVASCULAR: Normal rate and rhythm. Positive S1, inaudible S2 with harsh systolic murmur right second intercostal space, Systolic murmur at apex radiating to axilla 3/6.  Without gallops or rubs.  Distal pulses intact.  ABDOMINAL: Nonobese, soft, nontender, nondistended, positive bowel sounds in all 4 quadrants no apparent ascites.  EXTREMITIES: No peripheral edema, warm to touch bilaterally, 2+ dorsalis pedis and posterior tibial pulses HEMATOLOGIC: No significant bruising NEUROLOGIC: Oriented to person, place, and time. Nonfocal. Normal muscle tone.  PSYCHIATRIC: Normal mood and affect. Normal behavior. Cooperative  CARDIAC DATABASE: EKG: 02/08/2020: Normal sinus rhythm, 72 bpm, normal axis, poor R wave progression, without underlying ischemia or injury pattern, left atrial enlargement, occasional PVCs in a trigeminal pattern.  No prior ECGs for comparison.  Echocardiogram: 05/05/2019 (external):  Moderate calcification of the aortic valve with severe stenosis, peak velocity 3.49 m/s, peak  gradient 49 mmHg, valve area 0.76 sq cm.  Mild calcification of mitral annulus with mild insufficiency. Trace aortic, pulmonic, tricuspid insufficiency Normal LV size and function, EF 55%  02/11/2020: Left ventricular ejection fraction, by estimation, is 55 to 60%. The  left ventricle has normal function. The left ventricle has no regional wall motion abnormalities. There is mild concentric left ventricular hypertrophy. Left ventricular diastolic  parameters are indeterminate. Elevated left atrial pressure. The average left ventricular global longitudinal strain is -17.5 %.  Right ventricular systolic function is low normal. The right ventricular size is normal. There is normal pulmonary artery systolic pressure.  The mitral valve is degenerative. Trivial mitral valve regurgitation. No evidence of mitral stenosis.  The aortic valve is tricuspid. There is severe calcifcation of the aortic valve. There is severe thickening of the aortic valve. Aortic valve regurgitation is trivial. Severe aortic valve stenosis. Aortic valve area, by VTI measures 0.72 cm. Aortic  valve mean gradient measures 36.0 mmHg. Aortic valve Vmax measures 3.90 m/s. Dimensionless index 0.22.  Stress Testing: No results found for this or any previous visit from the past 1095 days.  Heart Catheterization: None  Carotid duplex: 05/05/2019 (external): No hemodynamically significant stenosis or aberrant flow on either side, normal antegrade flow bilaterally.  LABORATORY DATA: CBC Latest Ref Rng & Units 02/08/2020  Hemoglobin 11.1 - 15.9 g/dL 09.612.6  Hematocrit 04.534.0 - 46.6 % 37.6    CMP Latest Ref Rng &  Units 02/15/2020 02/08/2020  Glucose 65 - 99 mg/dL 539(J) 673(A)  BUN 8 - 27 mg/dL 11 8  Creatinine 1.93 - 1.00 mg/dL 7.90 2.40  Sodium 973 - 144 mmol/L 140 136  Potassium 3.5 - 5.2 mmol/L 4.0 4.0  Chloride 96 - 106 mmol/L 102 104  CO2 20 - 29 mmol/L 23 24  Calcium 8.7 - 10.3 mg/dL 9.7 9.7    Lipid Panel  No  results found for: CHOL, TRIG, HDL, CHOLHDL, VLDL, LDLCALC, LDLDIRECT, LABVLDL  No components found for: NTPROBNP Recent Labs    02/08/20 1309  PROBNP 144   No results for input(s): TSH in the last 8760 hours.  BMP Recent Labs    02/08/20 1309 02/15/20 0957  NA 136 140  K 4.0 4.0  CL 104 102  CO2 24 23  GLUCOSE 101* 126*  BUN 8 11  CREATININE 0.79 0.85  CALCIUM 9.7 9.7  GFRNONAA 72 66  GFRAA 83 76    HEMOGLOBIN A1C No results found for: HGBA1C, MPG  IMPRESSION:    ICD-10-CM   1. Nonrheumatic aortic valve stenosis  I35.0 ECHOCARDIOGRAM COMPLETE  2. PVC (premature ventricular contraction)  I49.3   3. Benign hypertension  I10   4. Type 2 diabetes mellitus without complication, without long-term current use of insulin (HCC)  E11.9   5. Mixed hyperlipidemia  E78.2   6. Hx of TIA (transient ischemic attack) and stroke  Z86.73   7. Former smoker  Z87.891      RECOMMENDATIONS: YAMILA CRAGIN is a 79 y.o. female whose past medical history and cardiac risk factors include: Hypertension, type 2 diabetes without insulin use, hyperlipidemia, former smoker, TIA, family history of CHF, sedentary lifestyle.  Nonrheumatic aortic valve stenosis  Based on the last echocardiogram she does have severe aortic stenosis with preserved left ventricular systolic function.  Clinically patient is asymptomatic despite increasing physical activity.  Shared decision was to continue to monitor closely and she will be more cognizant for symptoms of chest pain, shortness of breath, or symptoms of passing out.  If the symptoms surface she will seek medical attention by either coming to the office or going to the closest ER via EMS.  We will repeat a 72-month echocardiogram prior to the next office visit to reevaluate LVEF and AS.   Premature ventricular contraction: Asymptomatic.  Continue metoprolol succinate.  Continue to monitor.  Benign hypertension  Office blood pressure is within  acceptable range.   Currently managed by primary care provider.  Patient has tolerated transition from hydrochlorothiazide to losartan.  Repeat blood work 1 week later notes stable kidney function and electrolytes.  Most recent labs from 02/15/2020 independently reviewed with her at today's visit.  FINAL MEDICATION LIST END OF ENCOUNTER: No orders of the defined types were placed in this encounter.   There are no discontinued medications.   Current Outpatient Medications:  .  atorvastatin (LIPITOR) 10 MG tablet, Take 1 tablet by mouth at bedtime., Disp: , Rfl:  .  DILT-XR 240 MG 24 hr capsule, Take 240 mg by mouth daily., Disp: , Rfl:  .  glimepiride (AMARYL) 1 MG tablet, Take 1 tablet by mouth daily., Disp: , Rfl:  .  Lancets (ONETOUCH DELICA PLUS LANCET33G) MISC, Apply 1 each topically daily., Disp: , Rfl:  .  lansoprazole (PREVACID) 30 MG capsule, Take 30 mg by mouth daily., Disp: , Rfl:  .  losartan (COZAAR) 25 MG tablet, Take 1 tablet (25 mg total) by mouth in the morning.,  Disp: 90 tablet, Rfl: 0 .  metoprolol succinate (TOPROL XL) 25 MG 24 hr tablet, Take 1 tablet (25 mg total) by mouth daily., Disp: 90 tablet, Rfl: 0 .  ONETOUCH ULTRA test strip, 1 each daily., Disp: , Rfl:  .  potassium chloride SA (KLOR-CON) 20 MEQ tablet, Take 20 mEq by mouth daily., Disp: , Rfl:   Orders Placed This Encounter  Procedures  . ECHOCARDIOGRAM COMPLETE    There are no Patient Instructions on file for this visit.   --Continue cardiac medications as reconciled in final medication list. --Return in about 14 weeks (around 06/27/2020) for Follow up aortic stenosis. . Or sooner if needed. --Continue follow-up with your primary care physician regarding the management of your other chronic comorbid conditions.  Patient's questions and concerns were addressed to her satisfaction. She voices understanding of the instructions provided during this encounter.   This note was created using a voice  recognition software as a result there may be grammatical errors inadvertently enclosed that do not reflect the nature of this encounter. Every attempt is made to correct such errors.  Total time spent: 25 minutes.  [name], [place], [facility]  Pager: [phone] Office: [phone]

## 2020-04-06 ENCOUNTER — Other Ambulatory Visit: Payer: Self-pay

## 2020-04-06 ENCOUNTER — Ambulatory Visit
Admission: RE | Admit: 2020-04-06 | Discharge: 2020-04-06 | Disposition: A | Discharge status: Home / Self Care | Payer: Medicare Other | Source: Ambulatory Visit | Attending: Internal Medicine | Admitting: Internal Medicine

## 2020-04-06 DIAGNOSIS — N6489 Other specified disorders of breast: Secondary | ICD-10-CM

## 2020-04-24 ENCOUNTER — Other Ambulatory Visit: Payer: Self-pay | Admitting: Cardiology

## 2020-04-24 DIAGNOSIS — I10 Essential (primary) hypertension: Secondary | ICD-10-CM

## 2020-04-24 DIAGNOSIS — I350 Nonrheumatic aortic (valve) stenosis: Secondary | ICD-10-CM

## 2020-05-03 ENCOUNTER — Other Ambulatory Visit: Payer: Self-pay | Admitting: Cardiology

## 2020-05-03 DIAGNOSIS — I350 Nonrheumatic aortic (valve) stenosis: Secondary | ICD-10-CM

## 2020-05-03 DIAGNOSIS — I10 Essential (primary) hypertension: Secondary | ICD-10-CM

## 2020-05-15 ENCOUNTER — Ambulatory Visit (HOSPITAL_COMMUNITY): Payer: Medicare Other

## 2020-05-22 ENCOUNTER — Other Ambulatory Visit: Payer: Self-pay

## 2020-05-22 ENCOUNTER — Ambulatory Visit (HOSPITAL_COMMUNITY)
Admission: RE | Admit: 2020-05-22 | Discharge: 2020-05-22 | Disposition: A | Discharge status: Home / Self Care | Payer: Medicare Other | Source: Ambulatory Visit | Attending: Cardiology | Admitting: Cardiology

## 2020-05-22 DIAGNOSIS — I350 Nonrheumatic aortic (valve) stenosis: Secondary | ICD-10-CM | POA: Diagnosis not present

## 2020-05-22 DIAGNOSIS — E785 Hyperlipidemia, unspecified: Secondary | ICD-10-CM | POA: Diagnosis not present

## 2020-05-22 DIAGNOSIS — E119 Type 2 diabetes mellitus without complications: Secondary | ICD-10-CM | POA: Insufficient documentation

## 2020-05-22 DIAGNOSIS — Z87891 Personal history of nicotine dependence: Secondary | ICD-10-CM | POA: Insufficient documentation

## 2020-05-22 DIAGNOSIS — I10 Essential (primary) hypertension: Secondary | ICD-10-CM | POA: Diagnosis not present

## 2020-05-22 DIAGNOSIS — Z8673 Personal history of transient ischemic attack (TIA), and cerebral infarction without residual deficits: Secondary | ICD-10-CM | POA: Diagnosis not present

## 2020-05-22 LAB — ECHOCARDIOGRAM COMPLETE
AR max vel: 0.78 cm2
AV Area VTI: 0.65 cm2
AV Area mean vel: 0.65 cm2
AV Mean grad: 34.6 mmHg
AV Peak grad: 49.4 mmHg
Ao pk vel: 3.51 m/s
Area-P 1/2: 3.89 cm2
S' Lateral: 2.7 cm

## 2020-05-22 NOTE — Progress Notes (Signed)
  Echocardiogram 2D Echocardiogram with 3D has been performed.  Jacqueline Rich 05/22/2020, 12:05 PM

## 2020-06-28 NOTE — Progress Notes (Signed)
Pt called back, daniel informed the pt about echo results.

## 2020-06-28 NOTE — Progress Notes (Signed)
Attempted to call pt, no answer. Left vm requesting call back.

## 2020-07-10 ENCOUNTER — Encounter: Payer: Self-pay | Admitting: Cardiology

## 2020-07-10 ENCOUNTER — Other Ambulatory Visit: Payer: Self-pay

## 2020-07-10 ENCOUNTER — Ambulatory Visit: Payer: Medicare Other | Admitting: Cardiology

## 2020-07-10 VITALS — BP 140/72 | HR 65 | Temp 98.0°F | Ht 62.0 in | Wt 157.0 lb

## 2020-07-10 DIAGNOSIS — I10 Essential (primary) hypertension: Secondary | ICD-10-CM

## 2020-07-10 DIAGNOSIS — I493 Ventricular premature depolarization: Secondary | ICD-10-CM

## 2020-07-10 DIAGNOSIS — E782 Mixed hyperlipidemia: Secondary | ICD-10-CM

## 2020-07-10 DIAGNOSIS — Z87891 Personal history of nicotine dependence: Secondary | ICD-10-CM

## 2020-07-10 DIAGNOSIS — Z8673 Personal history of transient ischemic attack (TIA), and cerebral infarction without residual deficits: Secondary | ICD-10-CM

## 2020-07-10 DIAGNOSIS — I350 Nonrheumatic aortic (valve) stenosis: Secondary | ICD-10-CM

## 2020-07-10 DIAGNOSIS — E119 Type 2 diabetes mellitus without complications: Secondary | ICD-10-CM

## 2020-07-10 NOTE — Progress Notes (Signed)
Date:  07/10/2020   ID:  Jacqueline Rich, DOB [birth year], MRN [id number]  PCP:  [name], MD  Cardiologist:  [name], DO, [facility] (established care 02/08/2020) Former Cardiology Providers: Dr. Sol Blazing [facility], [place])  Date: 07/10/20 Last Office Visit: 03/21/2020  Chief Complaint  Patient presents with  . Aortic Stenosis  . Follow-up    HPI  Jacqueline Rich is a 80 y.o. female who presents to the office with a chief complaint of " 59-month follow-up for management of aortic stenosis. " Patient's past medical history and cardiovascular risk factors include: Aortic stenosis, hypertension, type 2 diabetes without insulin use, hyperlipidemia, former smoker, TIA.  She is referred to the office at the request of [name],* for evaluation of aortic stenosis, chest discomfort, dyspnea on exertion.   In March 2021 patient had moved from [place] to [place] to be closer to family and since then has establish care with myself for the management of aortic stenosis.  She was last seen in the office in November 2022 at that time she was asymptomatic with regards to her severe aortic stenosis.  Since last visit patient states that she is doing well from a cardiovascular standpoint.  She denies any chest pain or angina pectoris, no congestive heart failure symptoms, and no episodes of syncope.  She has not been hospitalized or seen in urgent care visit for cardiovascular symptoms.  She had a repeat echocardiogram in January 2022 which notes preserved LVEF and findings suggestive of severe aortic stenosis.  Her functional status remains relatively stable but her exercise frequency has been reduced due to the cold weather.  However, patient is very hopeful that with spring coming around she can go up for more walks and is also joined a Firef[employer].  FUNCTIONAL STATUS: Walks twice a week about 30 minutes each time and walk  between 0.25 - 0.5 miles.    ALLERGIES: No Known Allergies  MEDICATION LIST PRIOR TO VISIT: Current Meds  Medication Sig  . atorvastatin (LIPITOR) 10 MG tablet Take 1 tablet by mouth at bedtime.  Marland Kitchen DILT-XR 240 MG 24 hr capsule Take 240 mg by mouth daily.  Marland Kitchen glimepiride (AMARYL) 1 MG tablet Take 1 tablet by mouth daily.  . Lancets (ONETOUCH DELICA PLUS LANCET33G) MISC Apply 1 each topically daily.  . lansoprazole (PREVACID) 30 MG capsule Take 30 mg by mouth daily.  Marland Kitchen losartan (COZAAR) 25 MG tablet TAKE 1 TABLET(25 MG) BY MOUTH IN THE MORNING  . metoprolol succinate (TOPROL-XL) 25 MG 24 hr tablet TAKE 1 TABLET(25 MG) BY MOUTH DAILY  . ONETOUCH ULTRA test strip 1 each daily.  . potassium chloride SA (KLOR-CON) 20 MEQ tablet Take 20 mEq by mouth daily.     PAST MEDICAL HISTORY: Past Medical History:  Diagnosis Date  . Aortic stenosis   . Diabetes mellitus without complication (HCC)   . Hyperlipidemia   . Hypertension   . TIA (transient ischemic attack)     PAST SURGICAL HISTORY: History reviewed. No pertinent surgical history.  FAMILY HISTORY: The patient family history includes Cancer in her brother; Cancer - Colon in her mother; Cancer - Lung in her brother; Heart attack in her brother; Hypertension in her brother, father, and sister.  SOCIAL HISTORY:  The patient  reports that she quit smoking about 32 years ago. Her smoking use included cigarettes. She has never used smokeless tobacco. She reports current alcohol use of about 2.0 standard drinks of alcohol per week. She reports  that she does not use drugs.  REVIEW OF SYSTEMS: Review of Systems  Constitutional: Negative for malaise/fatigue.  Cardiovascular: Negative for chest pain, dyspnea on exertion, leg swelling, near-syncope, orthopnea, palpitations, paroxysmal nocturnal dyspnea and syncope.  Respiratory: Negative for snoring.   Hematologic/Lymphatic: Negative for bleeding problem. Does not bruise/bleed easily.  Gastrointestinal: Negative for  melena.   PHYSICAL EXAM: Vitals with BMI 07/10/2020 03/21/2020 02/23/2020  Height 5\' 2"  5\' 2"  5\' 2"   Weight 157 lbs 161 lbs 158 lbs  BMI 28.71 29.44 28.89  Systolic 140 137 161132  Diastolic 72 70 53  Pulse 65 68 49    CONSTITUTIONAL: Well-developed and well-nourished. No acute distress.  SKIN: Skin is warm and dry. No rash noted. No cyanosis. No pallor. No jaundice HEAD: Normocephalic and atraumatic.  EYES: No scleral icterus MOUTH/THROAT: Moist oral membranes.  NECK: No JVD present. No thyromegaly noted. No carotid bruits  LYMPHATIC: No visible cervical adenopathy.  CHEST Normal respiratory effort. No intercostal retractions  LUNGS: Clear to auscultation bilaterally. No stridor. No wheezes. No rales.  CARDIOVASCULAR: Normal rate and rhythm. Positive S1, inaudible S2 with harsh systolic murmur right second intercostal space, Systolic murmur at apex radiating to axilla 3/6.  Without gallops or rubs.  Distal pulses intact.  ABDOMINAL: Nonobese, soft, nontender, nondistended, positive bowel sounds in all 4 quadrants no apparent ascites.  EXTREMITIES: No peripheral edema, warm to touch bilaterally, 2+ dorsalis pedis and posterior tibial pulses HEMATOLOGIC: No significant bruising NEUROLOGIC: Oriented to person, place, and time. Nonfocal. Normal muscle tone.  PSYCHIATRIC: Normal mood and affect. Normal behavior. Cooperative  CARDIAC DATABASE: EKG: 07/10/2020: Normal sinus rhythm, 61 bpm, normal axis, poor R wave progression, without underlying injury pattern.  Echocardiogram: 05/05/2019 (external): Severe stenosis, peak velocity 3.49 m/s, peak gradient 49 mmHg, valve area 0.76 sq cm.  Mild calcification of mitral annulus with mild insufficiency. Trace aortic, pulmonic, tricuspid insufficiency Normal LV size and function, EF 55%  02/11/2020: LVEF 55-60%, mild LVH, indeterminate diastolic function, global longitudinal strain -17.5%, trivial MR.  Trivial AR. Severe aortic valve stenosis  (peak velocity 3.9 m/s, AVA by VTI 0.72 cm, mean gradient 36 mmHg, dimensionless index 0.22  05/22/2020: LVEF 55 to 60%, no regional wall motion abnormalities, mild LVH, grade 2 diastolic dysfunction, moderately dilated left atrium, mild MR, severe aortic stenosis (AVA by VTI 0.65 cm, AVA 5 telemetry 0.74 cm, peak velocity 3.5 m/s, mean gradient 34.6 mmHg, dimensionless index 0.19)  Stress Testing: No results found for this or any previous visit from the past 1095 days.  Heart Catheterization: None  Carotid duplex: 05/05/2019 (external): No hemodynamically significant stenosis or aberrant flow on either side, normal antegrade flow bilaterally.  LABORATORY DATA: CBC Latest Ref Rng & Units 02/08/2020  Hemoglobin 11.1 - 15.9 g/dL 09.612.6  Hematocrit 04.534.0 - 46.6 % 37.6    CMP Latest Ref Rng & Units 02/15/2020 02/08/2020  Glucose 65 - 99 mg/dL 409(W126(H) 119(J101(H)  BUN 8 - 27 mg/dL 11 8  Creatinine 4.780.57 - 1.00 mg/dL 2.950.85 6.210.79  Sodium 308134 - 144 mmol/L 140 136  Potassium 3.5 - 5.2 mmol/L 4.0 4.0  Chloride 96 - 106 mmol/L 102 104  CO2 20 - 29 mmol/L 23 24  Calcium 8.7 - 10.3 mg/dL 9.7 9.7    Lipid Panel  No results found for: CHOL, TRIG, HDL, CHOLHDL, VLDL, LDLCALC, LDLDIRECT, LABVLDL  No components found for: NTPROBNP Recent Labs    02/08/20 1309  PROBNP 144   No results for input(s): TSH in the  last 8760 hours.  BMP Recent Labs    02/08/20 1309 02/15/20 0957  NA 136 140  K 4.0 4.0  CL 104 102  CO2 24 23  GLUCOSE 101* 126*  BUN 8 11  CREATININE 0.79 0.85  CALCIUM 9.7 9.7  GFRNONAA 72 66  GFRAA 83 76    HEMOGLOBIN A1C No results found for: HGBA1C, MPG  IMPRESSION:    ICD-10-CM   1. Nonrheumatic aortic valve stenosis  I35.0 EKG 12-Lead    ECHOCARDIOGRAM COMPLETE  2. PVC (premature ventricular contraction)  I49.3   3. Benign hypertension  I10   4. Type 2 diabetes mellitus without complication, without long-term current use of insulin (HCC)  E11.9   5. Mixed  hyperlipidemia  E78.2   6. Hx of TIA (transient ischemic attack) and stroke  Z86.73   7. Former smoker  Z87.891      RECOMMENDATIONS: KIHANNA KAMIYA is a 80 y.o. female whose past medical history and cardiac risk factors include: Hypertension, type 2 diabetes without insulin use, hyperlipidemia, former smoker, TIA, family history of CHF, sedentary lifestyle.  Nonrheumatic aortic valve stenosis  Remains asymptomatic with regards to angina pectoris, congestive heart failure or syncope.    Echocardiogram results reviewed with the patient and her sister at today's office visit.  I have encouraged her to increase her physical activity to see if she has effort related symptoms of either chest pain or shortness of breath.  If so patient is asked to come in sooner than her scheduled appointment to discuss the management of aortic valve stenosis.  Patient verbalizes understanding.  Repeat echocardiogram in 6 months, tentatively scheduled for July 2022.  I will see her in the office thereafter.  Premature ventricular contraction: Asymptomatic.  Continue metoprolol succinate.  Continue to monitor.  Benign hypertension  Office blood pressure is within acceptable range.   Patient is requesting her help with regards to the management of her hypertension.  Patient will be enrolled into principal care management for ambulatory blood pressure monitoring  FINAL MEDICATION LIST END OF ENCOUNTER: No orders of the defined types were placed in this encounter.   There are no discontinued medications.   Current Outpatient Medications:  .  atorvastatin (LIPITOR) 10 MG tablet, Take 1 tablet by mouth at bedtime., Disp: , Rfl:  .  DILT-XR 240 MG 24 hr capsule, Take 240 mg by mouth daily., Disp: , Rfl:  .  glimepiride (AMARYL) 1 MG tablet, Take 1 tablet by mouth daily., Disp: , Rfl:  .  Lancets (ONETOUCH DELICA PLUS LANCET33G) MISC, Apply 1 each topically daily., Disp: , Rfl:  .  lansoprazole  (PREVACID) 30 MG capsule, Take 30 mg by mouth daily., Disp: , Rfl:  .  losartan (COZAAR) 25 MG tablet, TAKE 1 TABLET(25 MG) BY MOUTH IN THE MORNING, Disp: 90 tablet, Rfl: 0 .  metoprolol succinate (TOPROL-XL) 25 MG 24 hr tablet, TAKE 1 TABLET(25 MG) BY MOUTH DAILY, Disp: 90 tablet, Rfl: 0 .  ONETOUCH ULTRA test strip, 1 each daily., Disp: , Rfl:  .  potassium chloride SA (KLOR-CON) 20 MEQ tablet, Take 20 mEq by mouth daily., Disp: , Rfl:   Orders Placed This Encounter  Procedures  . EKG 12-Lead  . ECHOCARDIOGRAM COMPLETE    There are no Patient Instructions on file for this visit.   --Continue cardiac medications as reconciled in final medication list. --Return in about 5 months (around 11/24/2020) for Follow up aortic stenosis . Or sooner if needed. --Continue follow-up with  your primary care physician regarding the management of your other chronic comorbid conditions.  Patient's questions and concerns were addressed to her satisfaction. She voices understanding of the instructions provided during this encounter.   This note was created using a voice recognition software as a result there may be grammatical errors inadvertently enclosed that do not reflect the nature of this encounter. Every attempt is made to correct such errors.  Total time spent: 35 minutes.  [name], [place], [facility]  Pager: [phone] Office: [phone]

## 2020-07-22 ENCOUNTER — Other Ambulatory Visit: Payer: Self-pay | Admitting: Cardiology

## 2020-07-22 DIAGNOSIS — I350 Nonrheumatic aortic (valve) stenosis: Secondary | ICD-10-CM

## 2020-07-22 DIAGNOSIS — I10 Essential (primary) hypertension: Secondary | ICD-10-CM

## 2020-07-26 ENCOUNTER — Other Ambulatory Visit: Payer: Self-pay | Admitting: Cardiology

## 2020-07-26 DIAGNOSIS — I10 Essential (primary) hypertension: Secondary | ICD-10-CM

## 2020-07-26 DIAGNOSIS — I350 Nonrheumatic aortic (valve) stenosis: Secondary | ICD-10-CM

## 2020-07-28 ENCOUNTER — Ambulatory Visit: Payer: Medicare Other | Admitting: Podiatrist

## 2020-07-28 ENCOUNTER — Other Ambulatory Visit: Payer: Self-pay | Admitting: Podiatrist

## 2020-07-28 ENCOUNTER — Encounter: Payer: Self-pay | Admitting: Podiatrist

## 2020-07-28 ENCOUNTER — Ambulatory Visit: Payer: Self-pay

## 2020-07-28 ENCOUNTER — Ambulatory Visit (INDEPENDENT_AMBULATORY_CARE_PROVIDER_SITE_OTHER): Payer: Medicare Other

## 2020-07-28 ENCOUNTER — Other Ambulatory Visit: Payer: Self-pay

## 2020-07-28 DIAGNOSIS — R928 Other abnormal and inconclusive findings on diagnostic imaging of breast: Secondary | ICD-10-CM | POA: Insufficient documentation

## 2020-07-28 DIAGNOSIS — M21612 Bunion of left foot: Secondary | ICD-10-CM

## 2020-07-28 DIAGNOSIS — M21619 Bunion of unspecified foot: Secondary | ICD-10-CM | POA: Diagnosis not present

## 2020-07-28 DIAGNOSIS — E1169 Type 2 diabetes mellitus with other specified complication: Secondary | ICD-10-CM | POA: Insufficient documentation

## 2020-07-28 DIAGNOSIS — M7751 Other enthesopathy of right foot: Secondary | ICD-10-CM

## 2020-07-28 DIAGNOSIS — E785 Hyperlipidemia, unspecified: Secondary | ICD-10-CM | POA: Insufficient documentation

## 2020-07-28 DIAGNOSIS — Z85038 Personal history of other malignant neoplasm of large intestine: Secondary | ICD-10-CM | POA: Insufficient documentation

## 2020-07-28 DIAGNOSIS — K219 Gastro-esophageal reflux disease without esophagitis: Secondary | ICD-10-CM | POA: Insufficient documentation

## 2020-07-28 DIAGNOSIS — I10 Essential (primary) hypertension: Secondary | ICD-10-CM | POA: Insufficient documentation

## 2020-07-28 NOTE — Progress Notes (Signed)
Chief Complaint  Patient presents with  . Pain  . Bunions    Rt hallox joint pain x 1 mo; no injury; achy pain 6/10; min edema, +redness, -numbness/tingling. Pain gets worse w/ wearing shoes. Tx: alcohol/peroxide.   . Diabetes    FBS: 128: HGBA1C; n/a: PCP: Dr. Chales Salmon x Dec.      HPI: Patient is 80 y.o. female who presents today for the concerns as listed above. She has had pain at her right bunion area for about a month. She denies any trauma to the joint.  Denies any redness, swellling or calor.  Relates moderate pain when walking and when the bunion rubs on her shoes.   Patient Active Problem List   Diagnosis Date Noted  . Abnormal mammogram 07/28/2020  . Dyslipidemia 07/28/2020  . Essential hypertension 07/28/2020  . Gastroesophageal reflux disease 07/28/2020  . History of malignant neoplasm of colon 07/28/2020  . Type 2 diabetes mellitus with other specified complication (HCC) 07/28/2020  . Nonrheumatic aortic valve stenosis     Current Outpatient Medications on File Prior to Visit  Medication Sig Dispense Refill  . atorvastatin (LIPITOR) 10 MG tablet Take 1 tablet by mouth at bedtime.    Marland Kitchen DILT-XR 240 MG 24 hr capsule Take 240 mg by mouth daily.    Marland Kitchen glimepiride (AMARYL) 1 MG tablet Take 1 tablet by mouth daily.    . Lancets (ONETOUCH DELICA PLUS LANCET33G) MISC Apply 1 each topically daily.    . lansoprazole (PREVACID) 30 MG capsule Take 30 mg by mouth daily.    Marland Kitchen losartan (COZAAR) 25 MG tablet TAKE 1 TABLET(25 MG) BY MOUTH IN THE MORNING 90 tablet 0  . metoprolol succinate (TOPROL-XL) 25 MG 24 hr tablet TAKE 1 TABLET(25 MG) BY MOUTH DAILY 90 tablet 0  . ONETOUCH ULTRA test strip 1 each daily.    . potassium chloride SA (KLOR-CON) 20 MEQ tablet Take 20 mEq by mouth daily.     No current facility-administered medications on file prior to visit.    No Known Allergies  Review of Systems No fevers, chills, nausea, muscle aches, no difficulty breathing, no calf pain,  no chest pain or shortness of breath.   Physical Exam  GENERAL APPEARANCE: Alert, conversant. Appropriately groomed. No acute distress.   VASCULAR: Pedal pulses palpable DP and PT bilateral.  Capillary refill time is immediate to all digits,  Proximal to distal cooling it warm to warm.  Digital perfusion adequate.   NEUROLOGIC: sensation is intact to 5.07 monofilament at 5/5 sites bilateral.  Light touch is intact bilateral, vibratory sensation intact bilateral  MUSCULOSKELETAL: acceptable muscle strength, tone and stability bilateral.  Moderate bunion deformity is noted.  Inflamed bursa is also noted to be present .   DERMATOLOGIC: skin is warm, supple, and dry.  No open lesions noted.  No rash, no pre ulcerative lesions. Digital nails are asymptomatic.    Radiographic exam:  Normal osseous mineralization.  No fracture or dislocation or acute osseous abnormalities present.  Bunion deformity is present and notable on ap view.    Assessment     ICD-10-CM   1. Bursitis of right foot  M77.51   2. Bunion  M21.619      Plan  Exam and xray findings were discussed with the patient as well as Treatment options and alternatives.  I recommended a steriod injection into the bursae area of discomfort to help reduce swelling and pain in this area.   The patient agreed and a sterile  skin prep was applied.  An injection consisting of kenalog 10 and marcaine mixture was infiltrated into the bursae of the right foot/ bunion area.  The patient tolerated this well and was given instructions for aftercare.   She will see how she does with this injection and will call if the problem fails to resolve with the injections.

## 2020-07-28 NOTE — Patient Instructions (Signed)
Bursitis  Bursitis is when the fluid-filled sac (bursa) that covers and protects a joint is swollen (inflamed). Bursitis is most common near joints such as the knees, elbows, hips, and shoulders. It can cause pain and stiffness. Follow these instructions at home: Medicines  Take over-the-counter and prescription medicines only as told by your doctor.  If you were prescribed an antibiotic medicine, take it as told by your doctor. Do not stop taking it even if you start to feel better. General instructions  Rest the affected area as told by your doctor. ? If you can, raise (elevate) the affected area above the level of your heart while you are sitting or lying down. ? Avoid doing things that make the pain worse.  Use a splint, brace, pad, or walking aid as told by your doctor.  If directed, put ice on the affected area: ? If you have a removable splint or brace, take it off as told by your doctor. ? Put ice in a plastic bag. ? Place a towel between your skin and the bag, or between the splint or brace and the bag. ? Leave the ice on for 20 minutes, 2-3 times a day.  Keep all follow-up visits as told by your doctor. This is important.   Preventing symptoms Do these things to help you not have symptoms again:  Wear knee pads if you kneel often.  Wear sturdy running or walking shoes that fit you well.  Take a lot of breaks during activities that involve doing the same movements again and again.  Before you do any activity that takes a lot of effort, get your body ready by stretching.  Stay at a healthy weight or lose weight if your doctor says you should. If you need help doing this, ask your doctor.  Exercise often. If you start any new physical activity, do it slowly. Contact a doctor if you:  Have a fever.  Have chills.  Have symptoms that do not get better with treatment or home care. Summary  Bursitis is when the fluid-filled sac (bursa) that covers and protects a joint  is swollen.  Rest the affected area as told by your doctor.  Avoid doing things that make the pain worse.  Put ice on the affected area as told by your doctor. This information is not intended to replace advice given to you by your health care provider. Make sure you discuss any questions you have with your health care provider. Document Revised: 10/20/2019 Document Reviewed: 10/20/2019 Elsevier Patient Education  2021 [employer] Inc[employer]  

## 2020-08-28 DIAGNOSIS — I10 Essential (primary) hypertension: Secondary | ICD-10-CM | POA: Diagnosis not present

## 2020-09-20 DIAGNOSIS — I10 Essential (primary) hypertension: Secondary | ICD-10-CM | POA: Diagnosis not present

## 2020-09-20 DIAGNOSIS — R011 Cardiac murmur, unspecified: Secondary | ICD-10-CM | POA: Diagnosis not present

## 2020-09-20 DIAGNOSIS — Z85038 Personal history of other malignant neoplasm of large intestine: Secondary | ICD-10-CM | POA: Diagnosis not present

## 2020-09-20 DIAGNOSIS — Z7984 Long term (current) use of oral hypoglycemic drugs: Secondary | ICD-10-CM | POA: Diagnosis not present

## 2020-09-20 DIAGNOSIS — E1169 Type 2 diabetes mellitus with other specified complication: Secondary | ICD-10-CM | POA: Diagnosis not present

## 2020-09-27 DIAGNOSIS — I10 Essential (primary) hypertension: Secondary | ICD-10-CM | POA: Diagnosis not present

## 2020-10-20 ENCOUNTER — Other Ambulatory Visit: Payer: Self-pay | Admitting: Cardiology

## 2020-10-20 DIAGNOSIS — I350 Nonrheumatic aortic (valve) stenosis: Secondary | ICD-10-CM

## 2020-10-20 DIAGNOSIS — I10 Essential (primary) hypertension: Secondary | ICD-10-CM

## 2020-10-26 DIAGNOSIS — I10 Essential (primary) hypertension: Secondary | ICD-10-CM | POA: Diagnosis not present

## 2020-11-15 DIAGNOSIS — H25812 Combined forms of age-related cataract, left eye: Secondary | ICD-10-CM | POA: Diagnosis not present

## 2020-11-27 DIAGNOSIS — H2512 Age-related nuclear cataract, left eye: Secondary | ICD-10-CM | POA: Diagnosis not present

## 2020-11-27 DIAGNOSIS — H25812 Combined forms of age-related cataract, left eye: Secondary | ICD-10-CM | POA: Diagnosis not present

## 2020-11-27 HISTORY — PX: CATARACT EXTRACTION: SUR2

## 2020-12-01 DIAGNOSIS — I10 Essential (primary) hypertension: Secondary | ICD-10-CM | POA: Diagnosis not present

## 2020-12-06 ENCOUNTER — Other Ambulatory Visit: Payer: Self-pay

## 2020-12-06 ENCOUNTER — Ambulatory Visit (HOSPITAL_COMMUNITY)
Admission: RE | Admit: 2020-12-06 | Discharge: 2020-12-06 | Disposition: A | Discharge status: Home / Self Care | Payer: Medicare Other | Source: Ambulatory Visit | Attending: Cardiology | Admitting: Cardiology

## 2020-12-06 DIAGNOSIS — I10 Essential (primary) hypertension: Secondary | ICD-10-CM | POA: Insufficient documentation

## 2020-12-06 DIAGNOSIS — E119 Type 2 diabetes mellitus without complications: Secondary | ICD-10-CM | POA: Diagnosis not present

## 2020-12-06 DIAGNOSIS — I350 Nonrheumatic aortic (valve) stenosis: Secondary | ICD-10-CM | POA: Diagnosis not present

## 2020-12-06 DIAGNOSIS — E785 Hyperlipidemia, unspecified: Secondary | ICD-10-CM | POA: Insufficient documentation

## 2020-12-06 NOTE — Progress Notes (Signed)
  Echocardiogram 2D Echocardiogram has been performed.  Jacqueline Rich 12/06/2020, 8:51 AM

## 2020-12-11 ENCOUNTER — Encounter: Payer: Self-pay | Admitting: Cardiology

## 2020-12-11 ENCOUNTER — Other Ambulatory Visit: Payer: Self-pay

## 2020-12-11 ENCOUNTER — Ambulatory Visit: Payer: Medicare Other | Admitting: Cardiology

## 2020-12-11 VITALS — BP 148/82 | HR 67 | Temp 98.5°F | Ht 62.0 in | Wt 166.2 lb

## 2020-12-11 DIAGNOSIS — E782 Mixed hyperlipidemia: Secondary | ICD-10-CM

## 2020-12-11 DIAGNOSIS — I350 Nonrheumatic aortic (valve) stenosis: Secondary | ICD-10-CM

## 2020-12-11 DIAGNOSIS — E119 Type 2 diabetes mellitus without complications: Secondary | ICD-10-CM

## 2020-12-11 DIAGNOSIS — I493 Ventricular premature depolarization: Secondary | ICD-10-CM

## 2020-12-11 DIAGNOSIS — Z8673 Personal history of transient ischemic attack (TIA), and cerebral infarction without residual deficits: Secondary | ICD-10-CM

## 2020-12-11 DIAGNOSIS — I10 Essential (primary) hypertension: Secondary | ICD-10-CM

## 2020-12-11 DIAGNOSIS — Z87891 Personal history of nicotine dependence: Secondary | ICD-10-CM

## 2020-12-11 LAB — ECHOCARDIOGRAM COMPLETE
AR max vel: 0.75 cm2
AV Area VTI: 0.7 cm2
AV Area mean vel: 0.72 cm2
AV Mean grad: 34.6 mmHg
AV Peak grad: 57.9 mmHg
Ao pk vel: 3.8 m/s
Area-P 1/2: 2.6 cm2
Calc EF: 62.8 %
S' Lateral: 2.4 cm
Single Plane A2C EF: 62.3 %
Single Plane A4C EF: 63.3 %

## 2020-12-11 NOTE — Progress Notes (Signed)
Date:  12/11/2020   ID:  Jacqueline Rich, DOB 1940/10/01, MRN [id number]  PCP:  [name], MD  Cardiologist:  [name], DO, [facility] (established care 02/08/2020) Former Cardiology Providers: Dr. Sol Blazing [facility], [place])  Date: 12/11/20 Last Office Visit: 07/10/2020  Chief Complaint  Patient presents with   Nonrheumatic aortic valve stenosis   Hypertension   Follow-up    5 Month    HPI  Jacqueline Rich is a 80 y.o. female who presents to the office with a chief complaint of " follow-up for aortic stenosis. " Patient's past medical history and cardiovascular risk factors include: Aortic stenosis, hypertension, type 2 diabetes without insulin use, hyperlipidemia, former smoker, TIA.  She is referred to the office at the request of [name],* for evaluation of aortic stenosis, chest discomfort, dyspnea on exertion.   Presented to the office back in March 2021 to establish care given her underlying aortic stenosis.  She has been having echocardiogram periodically to monitor the severity and LVEF given her moderate/severe aortic stenosis.  Most recent echocardiogram reviewed and findings noted below for further reference.  Clinically, patient is doing well from a cardiovascular standpoint.  She denies chest pain, syncope, or heart failure symptoms.  She has not been hospitalized for cardiovascular symptoms at the last office encounter.  Initially she was going to the gym on a regular basis however over the last 2 months patient's been having more arthritis of the right knee which has limited her functional status.  ALLERGIES: No Known Allergies  MEDICATION LIST PRIOR TO VISIT: Current Meds  Medication Sig   atorvastatin (LIPITOR) 10 MG tablet Take 1 tablet by mouth at bedtime.   DILT-XR 240 MG 24 hr capsule Take 240 mg by mouth daily.   glimepiride (AMARYL) 1 MG tablet Take 1 tablet by mouth daily.   Lancets (ONETOUCH DELICA PLUS LANCET33G)  MISC Apply 1 each topically daily.   lansoprazole (PREVACID) 30 MG capsule Take 30 mg by mouth daily.   losartan (COZAAR) 25 MG tablet TAKE 1 TABLET(25 MG) BY MOUTH IN THE MORNING   metoprolol succinate (TOPROL-XL) 25 MG 24 hr tablet TAKE 1 TABLET(25 MG) BY MOUTH DAILY   ONETOUCH ULTRA test strip 1 each daily.   potassium chloride SA (KLOR-CON) 20 MEQ tablet Take 20 mEq by mouth daily.     PAST MEDICAL HISTORY: Past Medical History:  Diagnosis Date   Aortic stenosis    Diabetes mellitus without complication (HCC)    Hyperlipidemia    Hypertension    TIA (transient ischemic attack)     PAST SURGICAL HISTORY: Past Surgical History:  Procedure Laterality Date   CATARACT EXTRACTION Bilateral 11/27/2020    FAMILY HISTORY: The patient family history includes Cancer in her brother; Cancer - Colon in her mother; Cancer - Lung in her brother; Heart attack in her brother; Hypertension in her brother, father, and sister.  SOCIAL HISTORY:  The patient  reports that she quit smoking about 32 years ago. Her smoking use included cigarettes. She has never used smokeless tobacco. She reports current alcohol use of about 2.0 standard drinks per week. She reports that she does not use drugs.  REVIEW OF SYSTEMS: Review of Systems  Constitutional: Negative for malaise/fatigue.  Cardiovascular:  Negative for chest pain, dyspnea on exertion, leg swelling, near-syncope, orthopnea, palpitations, paroxysmal nocturnal dyspnea and syncope.  Respiratory:  Negative for snoring.   Hematologic/Lymphatic: Negative for bleeding problem. Does not bruise/bleed easily.  Gastrointestinal:  Negative for melena.  PHYSICAL EXAM: Vitals with BMI 12/11/2020 07/10/2020 03/21/2020  Height 5\' 2"  5\' 2"  5\' 2"   Weight 166 lbs 3 oz 157 lbs 161 lbs  BMI 30.39 28.71 29.44  Systolic 148 140  Diastolic 82 72 70  Pulse 67 65 68    CONSTITUTIONAL: Well-developed and well-nourished. No acute distress.  SKIN: Skin is warm  and dry. No rash noted. No cyanosis. No pallor. No jaundice HEAD: Normocephalic and atraumatic.  EYES: No scleral icterus MOUTH/THROAT: Moist oral membranes.  NECK: No JVD present. No thyromegaly noted. No carotid bruits  LYMPHATIC: No visible cervical adenopathy.  CHEST Normal respiratory effort. No intercostal retractions  LUNGS: Clear to auscultation bilaterally. No stridor. No wheezes. No rales.  CARDIOVASCULAR: Normal rate and rhythm. Positive S1, inaudible S2 with harsh systolic murmur right second intercostal space, Systolic murmur at apex radiating to axilla 3/6.  Without gallops or rubs.  Distal pulses intact.  ABDOMINAL: Nonobese, soft, nontender, nondistended, positive bowel sounds in all 4 quadrants no apparent ascites.  EXTREMITIES: No peripheral edema, warm to touch bilaterally, 2+ dorsalis pedis and posterior tibial pulses HEMATOLOGIC: No significant bruising NEUROLOGIC: Oriented to person, place, and time. Nonfocal. Normal muscle tone.  PSYCHIATRIC: Normal mood and affect. Normal behavior. Cooperative  CARDIAC DATABASE: EKG: 12/11/2020: Normal sinus rhythm, 75 bpm, LAE, without underlying injury pattern.  Echocardiogram: 05/22/2020: LVEF 55 to 60%, no regional wall motion abnormalities, mild LVH, grade 2 diastolic dysfunction, moderately dilated left atrium, mild MR, severe aortic stenosis (AVA by VTI 0.65 cm, AVA 5 telemetry 0.74 cm, peak velocity 3.5 m/s, mean gradient 34.6 mmHg, dimensionless index 0.19)  12/06/2020: LVEF 55 to 60%, no regional wall motion abnormality, mild LVH, indeterminate diastolic function, elevated LAP, GLS -22.4%, mild MR, trileaflet aortic valve, moderate/severe aortic stenosis (peak velocity 3.8 m/s, mean gradient 34.6 mmHg, AVA per VTI 0.7 cm, dimensional index 0.25)  Stress Testing: No results found for this or any previous visit from the past 1095 days.  Heart Catheterization: None  Carotid duplex: 05/05/2019 (external): No  hemodynamically significant stenosis or aberrant flow on either side, normal antegrade flow bilaterally.  LABORATORY DATA: CBC Latest Ref Rng & Units 02/08/2020  Hemoglobin 11.1 - 15.9 g/dL 02/05/2021  Hematocrit 07/03/2019 - 46.6 % 37.6    CMP Latest Ref Rng & Units 02/15/2020 02/08/2020  Glucose 65 - 99 mg/dL 25.9) 02/17/2020)  BUN 8 - 27 mg/dL 11 8  Creatinine 04/09/2020 - 1.00 mg/dL 563(O 756(E  Sodium 3.32 - 144 mmol/L 140 136  Potassium 3.5 - 5.2 mmol/L 4.0 4.0  Chloride 96 - 106 mmol/L 102 104  CO2 20 - 29 mmol/L 23 24  Calcium 8.7 - 10.3 mg/dL 9.7 9.7    Lipid Panel  No results found for: CHOL, TRIG, HDL, CHOLHDL, VLDL, LDLCALC, LDLDIRECT, LABVLDL  No components found for: NTPROBNP Recent Labs    02/08/20 1309  PROBNP 144   No results for input(s): TSH in the last 8760 hours.  BMP Recent Labs    02/08/20 1309 02/15/20 0957  NA 136 140  K 4.0 4.0  CL 104 102  CO2 24 23  GLUCOSE 101* 126*  BUN 8 11  CREATININE 0.79 0.85  CALCIUM 9.7 9.7  GFRNONAA 72 66  GFRAA 83 76    HEMOGLOBIN A1C No results found for: HGBA1C, MPG  IMPRESSION:    ICD-10-CM   1. Nonrheumatic aortic valve stenosis  I35.0 EKG 12-Lead    ECHOCARDIOGRAM COMPLETE    2. PVC (premature ventricular contraction)  I49.3     3. Benign hypertension  I10     4. Type 2 diabetes mellitus without complication, without long-term current use of insulin (HCC)  E11.9     5. Mixed hyperlipidemia  E78.2     6. Hx of TIA (transient ischemic attack) and stroke  Z86.73     7. Former smoker  Z87.891        RECOMMENDATIONS: Jacqueline Rich is a 80 y.o. female whose past medical history and cardiac risk factors include: Hypertension, type 2 diabetes without insulin use, hyperlipidemia, former smoker, TIA, family history of CHF, sedentary lifestyle.  Nonrheumatic aortic valve stenosis Most recent echocardiogram from August 2022 reviewed with the patient. Severity of aortic stenosis remains moderate/severe hemodynamics  noted above. She denies chest pain, heart failure symptoms, or syncope.  She verbalizes understanding that if the symptoms surface that she should go to the closest ER via EMS.  Premature ventricular contraction: Asymptomatic. Continue current pharmacology.  Continue to monitor.  Benign hypertension Currently enrolled in principal care management for ambulatory blood pressure monitoring.  Average blood pressure 128/77 mmHg with a pulse of 61 bpm. Continue losartan, metoprolol, and diltiazem.  FINAL MEDICATION LIST END OF ENCOUNTER: No orders of the defined types were placed in this encounter.   There are no discontinued medications.   Current Outpatient Medications:    atorvastatin (LIPITOR) 10 MG tablet, Take 1 tablet by mouth at bedtime., Disp: , Rfl:    DILT-XR 240 MG 24 hr capsule, Take 240 mg by mouth daily., Disp: , Rfl:    glimepiride (AMARYL) 1 MG tablet, Take 1 tablet by mouth daily., Disp: , Rfl:    Lancets (ONETOUCH DELICA PLUS LANCET33G) MISC, Apply 1 each topically daily., Disp: , Rfl:    lansoprazole (PREVACID) 30 MG capsule, Take 30 mg by mouth daily., Disp: , Rfl:    losartan (COZAAR) 25 MG tablet, TAKE 1 TABLET(25 MG) BY MOUTH IN THE MORNING, Disp: 90 tablet, Rfl: 0   metoprolol succinate (TOPROL-XL) 25 MG 24 hr tablet, TAKE 1 TABLET(25 MG) BY MOUTH DAILY, Disp: 90 tablet, Rfl: 0   ONETOUCH ULTRA test strip, 1 each daily., Disp: , Rfl:    potassium chloride SA (KLOR-CON) 20 MEQ tablet, Take 20 mEq by mouth daily., Disp: , Rfl:   Orders Placed This Encounter  Procedures   EKG 12-Lead   ECHOCARDIOGRAM COMPLETE    There are no Patient Instructions on file for this visit.   --Continue cardiac medications as reconciled in final medication list. --Return in about 6 months (around 06/13/2021) for Follow up AS after echo. . Or sooner if needed. --Continue follow-up with your primary care physician regarding the management of your other chronic comorbid  conditions.  Patient's questions and concerns were addressed to her satisfaction. She voices understanding of the instructions provided during this encounter.   This note was created using a voice recognition software as a result there may be grammatical errors inadvertently enclosed that do not reflect the nature of this encounter. Every attempt is made to correct such errors.  Total time spent: 30 minutes.  [name], [place], [facility]  Pager: [phone] Office: [phone]

## 2020-12-12 ENCOUNTER — Ambulatory Visit (HOSPITAL_COMMUNITY): Payer: Medicare Other

## 2020-12-18 DIAGNOSIS — H524 Presbyopia: Secondary | ICD-10-CM | POA: Diagnosis not present

## 2020-12-18 DIAGNOSIS — H5203 Hypermetropia, bilateral: Secondary | ICD-10-CM | POA: Diagnosis not present

## 2020-12-18 DIAGNOSIS — Z961 Presence of intraocular lens: Secondary | ICD-10-CM | POA: Diagnosis not present

## 2020-12-21 DIAGNOSIS — I350 Nonrheumatic aortic (valve) stenosis: Secondary | ICD-10-CM

## 2021-01-01 DIAGNOSIS — I10 Essential (primary) hypertension: Secondary | ICD-10-CM | POA: Diagnosis not present

## 2021-01-12 ENCOUNTER — Other Ambulatory Visit: Payer: Self-pay | Admitting: Cardiology

## 2021-01-12 DIAGNOSIS — I10 Essential (primary) hypertension: Secondary | ICD-10-CM

## 2021-01-12 DIAGNOSIS — I350 Nonrheumatic aortic (valve) stenosis: Secondary | ICD-10-CM

## 2021-02-16 DIAGNOSIS — Z09 Encounter for follow-up examination after completed treatment for conditions other than malignant neoplasm: Secondary | ICD-10-CM | POA: Diagnosis not present

## 2021-02-28 ENCOUNTER — Other Ambulatory Visit: Payer: Self-pay | Admitting: Internal Medicine

## 2021-02-28 DIAGNOSIS — Z1231 Encounter for screening mammogram for malignant neoplasm of breast: Secondary | ICD-10-CM

## 2021-03-16 DIAGNOSIS — H33101 Unspecified retinoschisis, right eye: Secondary | ICD-10-CM | POA: Diagnosis not present

## 2021-03-16 DIAGNOSIS — H43813 Vitreous degeneration, bilateral: Secondary | ICD-10-CM | POA: Diagnosis not present

## 2021-03-26 DIAGNOSIS — E785 Hyperlipidemia, unspecified: Secondary | ICD-10-CM | POA: Diagnosis not present

## 2021-03-26 DIAGNOSIS — Z1389 Encounter for screening for other disorder: Secondary | ICD-10-CM | POA: Diagnosis not present

## 2021-03-26 DIAGNOSIS — E1169 Type 2 diabetes mellitus with other specified complication: Secondary | ICD-10-CM | POA: Diagnosis not present

## 2021-03-26 DIAGNOSIS — Z0000 Encounter for general adult medical examination without abnormal findings: Secondary | ICD-10-CM | POA: Diagnosis not present

## 2021-04-09 ENCOUNTER — Ambulatory Visit
Admission: RE | Admit: 2021-04-09 | Discharge: 2021-04-09 | Disposition: A | Discharge status: Home / Self Care | Payer: Medicare Other | Source: Ambulatory Visit | Attending: Internal Medicine | Admitting: Internal Medicine

## 2021-04-09 DIAGNOSIS — Z1231 Encounter for screening mammogram for malignant neoplasm of breast: Secondary | ICD-10-CM

## 2021-04-10 ENCOUNTER — Other Ambulatory Visit: Payer: Self-pay | Admitting: Internal Medicine

## 2021-04-10 DIAGNOSIS — R9389 Abnormal findings on diagnostic imaging of other specified body structures: Secondary | ICD-10-CM

## 2021-04-11 ENCOUNTER — Other Ambulatory Visit: Payer: Self-pay | Admitting: Cardiology

## 2021-04-11 ENCOUNTER — Other Ambulatory Visit: Payer: Self-pay | Admitting: Internal Medicine

## 2021-04-11 DIAGNOSIS — R928 Other abnormal and inconclusive findings on diagnostic imaging of breast: Secondary | ICD-10-CM

## 2021-04-11 DIAGNOSIS — I10 Essential (primary) hypertension: Secondary | ICD-10-CM

## 2021-04-11 DIAGNOSIS — I350 Nonrheumatic aortic (valve) stenosis: Secondary | ICD-10-CM

## 2021-04-14 ENCOUNTER — Other Ambulatory Visit: Payer: Self-pay | Admitting: Cardiology

## 2021-04-14 DIAGNOSIS — I350 Nonrheumatic aortic (valve) stenosis: Secondary | ICD-10-CM

## 2021-04-14 DIAGNOSIS — I10 Essential (primary) hypertension: Secondary | ICD-10-CM

## 2021-04-27 ENCOUNTER — Ambulatory Visit: Admission: RE | Admit: 2021-04-27 | Payer: Medicare Other | Source: Ambulatory Visit

## 2021-04-27 ENCOUNTER — Ambulatory Visit
Admission: RE | Admit: 2021-04-27 | Discharge: 2021-04-27 | Disposition: A | Discharge status: Home / Self Care | Payer: Medicare Other | Source: Ambulatory Visit | Attending: Internal Medicine | Admitting: Internal Medicine

## 2021-04-27 DIAGNOSIS — R922 Inconclusive mammogram: Secondary | ICD-10-CM | POA: Diagnosis not present

## 2021-04-27 DIAGNOSIS — R928 Other abnormal and inconclusive findings on diagnostic imaging of breast: Secondary | ICD-10-CM

## 2021-05-03 DIAGNOSIS — K121 Other forms of stomatitis: Secondary | ICD-10-CM | POA: Diagnosis not present

## 2021-05-03 DIAGNOSIS — R221 Localized swelling, mass and lump, neck: Secondary | ICD-10-CM | POA: Diagnosis not present

## 2021-05-15 DIAGNOSIS — N6489 Other specified disorders of breast: Secondary | ICD-10-CM | POA: Diagnosis not present

## 2021-05-17 DIAGNOSIS — J392 Other diseases of pharynx: Secondary | ICD-10-CM | POA: Diagnosis not present

## 2021-05-17 DIAGNOSIS — E1169 Type 2 diabetes mellitus with other specified complication: Secondary | ICD-10-CM | POA: Diagnosis not present

## 2021-05-17 DIAGNOSIS — K219 Gastro-esophageal reflux disease without esophagitis: Secondary | ICD-10-CM | POA: Diagnosis not present

## 2021-05-17 DIAGNOSIS — Z7984 Long term (current) use of oral hypoglycemic drugs: Secondary | ICD-10-CM | POA: Diagnosis not present

## 2021-05-18 DIAGNOSIS — H43813 Vitreous degeneration, bilateral: Secondary | ICD-10-CM | POA: Diagnosis not present

## 2021-05-18 DIAGNOSIS — H33101 Unspecified retinoschisis, right eye: Secondary | ICD-10-CM | POA: Diagnosis not present

## 2021-05-21 ENCOUNTER — Other Ambulatory Visit: Payer: Medicare Other

## 2021-06-12 DIAGNOSIS — R070 Pain in throat: Secondary | ICD-10-CM | POA: Diagnosis not present

## 2021-06-12 DIAGNOSIS — D3705 Neoplasm of uncertain behavior of pharynx: Secondary | ICD-10-CM | POA: Diagnosis not present

## 2021-06-20 ENCOUNTER — Other Ambulatory Visit: Payer: Medicare Other

## 2021-06-21 ENCOUNTER — Other Ambulatory Visit: Payer: Self-pay

## 2021-06-21 ENCOUNTER — Ambulatory Visit (HOSPITAL_COMMUNITY)
Admission: RE | Admit: 2021-06-21 | Discharge: 2021-06-21 | Disposition: A | Discharge status: Home / Self Care | Payer: Medicare Other | Source: Ambulatory Visit | Attending: Cardiology | Admitting: Cardiology

## 2021-06-21 DIAGNOSIS — E119 Type 2 diabetes mellitus without complications: Secondary | ICD-10-CM | POA: Diagnosis not present

## 2021-06-21 DIAGNOSIS — I340 Nonrheumatic mitral (valve) insufficiency: Secondary | ICD-10-CM | POA: Diagnosis not present

## 2021-06-21 DIAGNOSIS — I119 Hypertensive heart disease without heart failure: Secondary | ICD-10-CM | POA: Insufficient documentation

## 2021-06-21 DIAGNOSIS — I350 Nonrheumatic aortic (valve) stenosis: Secondary | ICD-10-CM | POA: Insufficient documentation

## 2021-06-21 DIAGNOSIS — I700 Atherosclerosis of aorta: Secondary | ICD-10-CM | POA: Diagnosis not present

## 2021-06-21 DIAGNOSIS — E785 Hyperlipidemia, unspecified: Secondary | ICD-10-CM | POA: Diagnosis not present

## 2021-06-21 DIAGNOSIS — Z8673 Personal history of transient ischemic attack (TIA), and cerebral infarction without residual deficits: Secondary | ICD-10-CM | POA: Diagnosis not present

## 2021-06-21 NOTE — Progress Notes (Signed)
Echocardiogram 2D Echocardiogram has been performed.  Eduard Roux 06/21/2021, 11:32 AM

## 2021-07-12 ENCOUNTER — Ambulatory Visit: Payer: Medicare Other | Admitting: Cardiology

## 2021-07-14 ENCOUNTER — Other Ambulatory Visit: Payer: Self-pay | Admitting: Cardiology

## 2021-07-14 DIAGNOSIS — I350 Nonrheumatic aortic (valve) stenosis: Secondary | ICD-10-CM

## 2021-07-14 DIAGNOSIS — I10 Essential (primary) hypertension: Secondary | ICD-10-CM

## 2021-07-16 ENCOUNTER — Encounter: Payer: Self-pay | Admitting: Cardiology

## 2021-07-16 ENCOUNTER — Ambulatory Visit: Payer: Medicare Other | Admitting: Cardiology

## 2021-07-16 ENCOUNTER — Other Ambulatory Visit: Payer: Self-pay

## 2021-07-16 VITALS — BP 149/80 | HR 71 | Temp 97.8°F | Resp 16 | Ht 62.0 in | Wt 169.0 lb

## 2021-07-16 DIAGNOSIS — I493 Ventricular premature depolarization: Secondary | ICD-10-CM | POA: Diagnosis not present

## 2021-07-16 DIAGNOSIS — I10 Essential (primary) hypertension: Secondary | ICD-10-CM | POA: Diagnosis not present

## 2021-07-16 DIAGNOSIS — Z01810 Encounter for preprocedural cardiovascular examination: Secondary | ICD-10-CM | POA: Diagnosis not present

## 2021-07-16 DIAGNOSIS — I350 Nonrheumatic aortic (valve) stenosis: Secondary | ICD-10-CM | POA: Diagnosis not present

## 2021-07-16 DIAGNOSIS — Z8673 Personal history of transient ischemic attack (TIA), and cerebral infarction without residual deficits: Secondary | ICD-10-CM

## 2021-07-16 DIAGNOSIS — E782 Mixed hyperlipidemia: Secondary | ICD-10-CM

## 2021-07-16 DIAGNOSIS — Z87891 Personal history of nicotine dependence: Secondary | ICD-10-CM

## 2021-07-16 DIAGNOSIS — E119 Type 2 diabetes mellitus without complications: Secondary | ICD-10-CM

## 2021-07-16 NOTE — Progress Notes (Signed)
? ?ID:  Jacqueline Rich, DOB Dec 27, 1940, MRN [id number] ? ?PCP:  Jacqueline Ishihara, MD  ?Cardiologist:  [name], DO, [facility] (established care 02/08/2020) ?Former Cardiology Providers: Dr. Sol Blazing [facility], [place]) ? ?Date: 07/16/21 ?Last Office Visit: 12/11/2020  ? ?Chief Complaint  ?Patient presents with  ? Follow-up  ?  30-month follow-up for aortic stenosis management  ? ? ?HPI  ?Jacqueline Rich is a 81 y.o. female whose past medical history and cardiovascular risk factors include: Aortic stenosis, hypertension, type 2 diabetes without insulin use, hyperlipidemia, former smoker, TIA. ? ?She is referred to the office at the request of Jacqueline Rich,* for evaluation of aortic stenosis, chest discomfort, dyspnea on exertion.  ? ?Patient has had a long history of aortic stenosis which is being managed medically.  At last office visit the shared decision was to follow-up with a 81-month echocardiogram to reevaluate the severity of aortic stenosis and LVEF.  She was asked to come in sooner if she has symptoms of chest pain, heart failure symptoms, near-syncope or syncope. ? ?Since last office visit she remains asymptomatic.  When asked in additional details if she has been walking and exercising patient states that she is limited due to her osteoarthritis of the left knee.  She is planning on having knee replacement, breast procedure, and oral procedure but has been reluctant given her aortic stenosis. ? ?She was going to the gym regularly but now has not been going due to lack of motivation. ? ?ALLERGIES: ?No Known Allergies ? ?MEDICATION LIST PRIOR TO VISIT: ?Current Meds  ?Medication Sig  ? atorvastatin (LIPITOR) 10 MG tablet Take 1 tablet by mouth at bedtime.  ? DILT-XR 240 MG 24 hr capsule Take 240 mg by mouth daily.  ? glimepiride (AMARYL) 1 MG tablet Take 1 tablet by mouth daily.  ? Lancets (ONETOUCH DELICA PLUS LANCET33G) MISC Apply 1 each topically daily.  ? losartan (COZAAR) 25 MG  tablet TAKE 1 TABLET(25 MG) BY MOUTH IN THE MORNING  ? metoprolol succinate (TOPROL-XL) 25 MG 24 hr tablet TAKE 1 TABLET(25 MG) BY MOUTH DAILY  ? ONETOUCH ULTRA test strip 1 each daily.  ? potassium chloride SA (KLOR-CON) 20 MEQ tablet Take 20 mEq by mouth daily.  ?  ? ?PAST MEDICAL HISTORY: ?Past Medical History:  ?Diagnosis Date  ? Aortic stenosis   ? Diabetes mellitus without complication (HCC)   ? Hyperlipidemia   ? Hypertension   ? TIA (transient ischemic attack)   ? ? ?PAST SURGICAL HISTORY: ?Past Surgical History:  ?Procedure Laterality Date  ? CATARACT EXTRACTION Bilateral 11/27/2020  ? ? ?FAMILY HISTORY: ?The patient family history includes Cancer in her brother; Cancer - Colon in her mother; Cancer - Lung in her brother; Heart attack in her brother; Hypertension in her brother, father, and sister. ? ?SOCIAL HISTORY:  ?The patient  reports that she quit smoking about 33 years ago. Her smoking use included cigarettes. She has never used smokeless tobacco. She reports current alcohol use of about 2.0 standard drinks per week. She reports that she does not use drugs. ? ?REVIEW OF SYSTEMS: ?Review of Systems  ?Constitutional: Negative for malaise/fatigue.  ?Cardiovascular:  Negative for chest pain, dyspnea on exertion, leg swelling, near-syncope, orthopnea, palpitations, paroxysmal nocturnal dyspnea and syncope.  ?Respiratory:  Negative for snoring.   ?Hematologic/Lymphatic: Negative for bleeding problem. Does not bruise/bleed easily.  ?Gastrointestinal:  Negative for melena.  ? ?PHYSICAL EXAM: ?Vitals with BMI 07/16/2021 12/11/2020 07/10/2020  ?Height 5\' 2"  5\' 2"  5'  2"  ?Weight 169 lbs 166 lbs 3 oz 157 lbs  ?BMI 30.9 30.39 28.71  ?Systolic 159 148 [id number]  ?Diastolic 80 82 72  ?Pulse 71 67 65  ? ? ?CONSTITUTIONAL: Well-developed and well-nourished. No acute distress.  ?SKIN: Skin is warm and dry. No rash noted. No cyanosis. No pallor. No jaundice ?HEAD: Normocephalic and atraumatic.  ?EYES: No scleral  icterus ?MOUTH/THROAT: Moist oral membranes.  ?NECK: No JVD present. No thyromegaly noted. No carotid bruits  ?LYMPHATIC: No visible cervical adenopathy.  ?CHEST Normal respiratory effort. No intercostal retractions  ?LUNGS: Clear to auscultation bilaterally. No stridor. No wheezes. No rales.  ?CARDIOVASCULAR: Normal rate and rhythm. Positive S1, inaudible S2 with harsh systolic murmur right second intercostal space, Systolic murmur at apex radiating to axilla 3/6.  Without gallops or rubs.  Distal pulses intact.  ?ABDOMINAL: Nonobese, soft, nontender, nondistended, positive bowel sounds in all 4 quadrants no apparent ascites.  ?EXTREMITIES: No peripheral edema, warm to touch bilaterally, 2+ dorsalis pedis and posterior tibial pulses ?HEMATOLOGIC: No significant bruising ?NEUROLOGIC: Oriented to person, place, and time. Nonfocal. Normal muscle tone.  ?PSYCHIATRIC: Normal mood and affect. Normal behavior. Cooperative ? ?CARDIAC DATABASE: ?EKG: ?07/16/2021: NSR, 72bpm, normal axis, without underlying injury pattern. ? ?Echocardiogram: ?05/22/2020: ?LVEF 55 to 60%, no regional wall motion abnormalities, mild LVH, grade 2 diastolic dysfunction, moderately dilated left atrium, mild MR, severe aortic stenosis (AVA by VTI 0.65 cm?, AVA 5 telemetry 0.74 cm?, peak velocity 3.5 m/s, mean gradient 34.6 mmHg, dimensionless index 0.19) ? ?12/06/2020: ?LVEF 55 to 60%, no regional wall motion abnormality, mild LVH, indeterminate diastolic function, elevated LAP, GLS -22.4%, mild MR, trileaflet aortic valve, moderate/severe aortic stenosis (peak velocity 3.8 m/s, mean gradient 34.6 mmHg, AVA per VTI 0.7 cm?, dimensional index 0.25) ? ?06/21/2021: ? 1. Left ventricular ejection fraction, by estimation, is 60 to 65%. The left ventricle has normal function. The left ventricle has no regional wall motion abnormalities. There is mild left ventricular hypertrophy. Left ventricular diastolic parameters  ?are consistent with Grade II diastolic  dysfunction (pseudonormalization). Elevated left atrial pressure. The E/e' is 22.  ? 2. Right ventricular systolic function is normal. The right ventricular size is normal.  ? 3. Left atrial size was mildly dilated.  ? 4. The mitral valve is degenerative. Mild mitral valve regurgitation. No evidence of mitral stenosis.  ? 5. The aortic valve is tricuspid. There is moderate calcification of the aortic valve. There is moderate thickening of the aortic valve. Aortic valve regurgitation is trivial. Moderate aortic stenosis (peak velocity 4.6757m/s, MG 42mmHG, AVA VTI 0.84cm2,  ?DI 0.25).  ? ?Stress Testing: ?No results found for this or any previous visit from the past 1095 days. ? ?Heart Catheterization: ?None ? ?Carotid duplex: ?05/05/2019 (external): ?No hemodynamically significant stenosis or aberrant flow on either side, normal antegrade flow bilaterally. ? ?LABORATORY DATA: ?CBC Latest Ref Rng & Units 02/08/2020  ?Hemoglobin 11.1 - 15.9 g/dL 09.612.6  ?Hematocrit 34.0 - 46.6 % 37.6  ? ? ?CMP Latest Ref Rng & Units 02/15/2020 02/08/2020  ?Glucose 65 - 99 mg/dL 045(W126(H) 098(J101(H)  ?BUN 8 - 27 mg/dL 11 8  ?Creatinine 0.57 - 1.00 mg/dL 1.910.85 4.780.79  ?Sodium 134 - 144 mmol/L 140 136  ?Potassium 3.5 - 5.2 mmol/L 4.0 4.0  ?Chloride 96 - 106 mmol/L 102 104  ?CO2 20 - 29 mmol/L 23 24  ?Calcium 8.7 - 10.3 mg/dL 9.7 9.7  ? ? ?Lipid Panel  ?No results found for: CHOL, TRIG, HDL, CHOLHDL, VLDL,  LDLCALC, LDLDIRECT, LABVLDL ? ?No components found for: NTPROBNP ?No results for input(s): PROBNP in the last 8760 hours. ? ?No results for input(s): TSH in the last 8760 hours. ? ?BMP ?No results for input(s): NA, K, CL, CO2, GLUCOSE, BUN, CREATININE, CALCIUM, GFRNONAA, GFRAA in the last 8760 hours. ? ? ?HEMOGLOBIN A1C ?No results found for: HGBA1C, MPG ? ?IMPRESSION: ? ?  ICD-10-CM   ?1. Nonrheumatic aortic valve stenosis  I35.0 PCV MYOCARDIAL PERFUSION WO LEXISCAN  ?  ?2. Preop cardiovascular exam  Z01.810 PCV MYOCARDIAL PERFUSION WO LEXISCAN  ?   ?3. Benign hypertension  I10 EKG 12-Lead  ?  ?4. PVC (premature ventricular contraction)  I49.3   ?  ?5. Type 2 diabetes mellitus without complication, without long-term current use of insulin (HCC)  E11.9   ?  ?6. Mix

## 2021-07-17 DIAGNOSIS — I10 Essential (primary) hypertension: Secondary | ICD-10-CM | POA: Diagnosis not present

## 2021-07-17 DIAGNOSIS — I350 Nonrheumatic aortic (valve) stenosis: Secondary | ICD-10-CM | POA: Diagnosis not present

## 2021-07-17 LAB — ECHOCARDIOGRAM COMPLETE
AR max vel: 0.88 cm2
AV Area VTI: 0.84 cm2
AV Area mean vel: 0.83 cm2
AV Mean grad: 41.8 mmHg
AV Peak grad: 68.8 mmHg
Ao pk vel: 4.15 m/s
Area-P 1/2: 2.53 cm2
S' Lateral: 2.6 cm

## 2021-07-25 ENCOUNTER — Other Ambulatory Visit: Payer: Self-pay | Admitting: Cardiology

## 2021-07-25 DIAGNOSIS — I350 Nonrheumatic aortic (valve) stenosis: Secondary | ICD-10-CM

## 2021-07-25 DIAGNOSIS — I10 Essential (primary) hypertension: Secondary | ICD-10-CM

## 2021-07-27 ENCOUNTER — Telehealth: Payer: Self-pay | Admitting: Cardiology

## 2021-07-27 ENCOUNTER — Other Ambulatory Visit: Payer: Self-pay

## 2021-07-27 DIAGNOSIS — I10 Essential (primary) hypertension: Secondary | ICD-10-CM

## 2021-07-27 DIAGNOSIS — I350 Nonrheumatic aortic (valve) stenosis: Secondary | ICD-10-CM

## 2021-07-27 MED ORDER — LOSARTAN POTASSIUM 25 MG PO TABS: ORAL_TABLET | ORAL | 3 refills | Status: DC

## 2021-07-27 MED ORDER — METOPROLOL SUCCINATE ER 25 MG PO TB24: ORAL_TABLET | ORAL | 3 refills | Status: DC

## 2021-07-27 NOTE — Telephone Encounter (Signed)
Patient requesting refill for losartan and metoprolol. She says the losartan needs a new prescription sent in. Prefers [facility] on [address]. ?

## 2021-07-27 NOTE — Telephone Encounter (Signed)
Done

## 2021-08-06 ENCOUNTER — Ambulatory Visit: Payer: Medicare Other

## 2021-08-06 DIAGNOSIS — Z01810 Encounter for preprocedural cardiovascular examination: Secondary | ICD-10-CM | POA: Diagnosis not present

## 2021-08-06 DIAGNOSIS — I350 Nonrheumatic aortic (valve) stenosis: Secondary | ICD-10-CM | POA: Diagnosis not present

## 2021-08-07 DIAGNOSIS — E785 Hyperlipidemia, unspecified: Secondary | ICD-10-CM | POA: Diagnosis not present

## 2021-08-07 DIAGNOSIS — I10 Essential (primary) hypertension: Secondary | ICD-10-CM | POA: Diagnosis not present

## 2021-08-07 DIAGNOSIS — E1169 Type 2 diabetes mellitus with other specified complication: Secondary | ICD-10-CM | POA: Diagnosis not present

## 2021-08-07 DIAGNOSIS — Z85038 Personal history of other malignant neoplasm of large intestine: Secondary | ICD-10-CM | POA: Diagnosis not present

## 2021-08-08 ENCOUNTER — Other Ambulatory Visit: Payer: Self-pay | Admitting: Internal Medicine

## 2021-08-08 DIAGNOSIS — E2839 Other primary ovarian failure: Secondary | ICD-10-CM

## 2021-08-13 ENCOUNTER — Ambulatory Visit: Payer: Medicare Other | Admitting: Cardiology

## 2021-08-13 ENCOUNTER — Encounter: Payer: Self-pay | Admitting: Cardiology

## 2021-08-13 VITALS — BP 137/64 | HR 70 | Temp 98.4°F | Resp 16 | Ht 62.0 in | Wt 171.2 lb

## 2021-08-13 DIAGNOSIS — I10 Essential (primary) hypertension: Secondary | ICD-10-CM | POA: Diagnosis not present

## 2021-08-13 DIAGNOSIS — E782 Mixed hyperlipidemia: Secondary | ICD-10-CM

## 2021-08-13 DIAGNOSIS — I350 Nonrheumatic aortic (valve) stenosis: Secondary | ICD-10-CM

## 2021-08-13 DIAGNOSIS — I493 Ventricular premature depolarization: Secondary | ICD-10-CM

## 2021-08-13 DIAGNOSIS — Z01810 Encounter for preprocedural cardiovascular examination: Secondary | ICD-10-CM | POA: Diagnosis not present

## 2021-08-13 DIAGNOSIS — Z87891 Personal history of nicotine dependence: Secondary | ICD-10-CM

## 2021-08-13 DIAGNOSIS — E119 Type 2 diabetes mellitus without complications: Secondary | ICD-10-CM | POA: Diagnosis not present

## 2021-08-13 DIAGNOSIS — Z8673 Personal history of transient ischemic attack (TIA), and cerebral infarction without residual deficits: Secondary | ICD-10-CM

## 2021-08-13 NOTE — Progress Notes (Signed)
? ?ID:  Jacqueline Rich, DOB 1941-01-02, MRN [id number] ? ?PCP:  [name], MD  ?Cardiologist:  [name], DO, [facility] (established care 02/08/2020) ?Former Cardiology Providers: Dr. Sol Blazing [facility], [place]) ? ?Date: 08/13/21 ?Last Office Visit: 07/16/2021 ? ?Chief Complaint  ?Patient presents with  ? Follow-up  ?  Aortic stenosis. ?Review stress test results. ?Preop evaluation  ? ? ?HPI  ?Jacqueline Rich is a 81 y.o. female whose past medical history and cardiovascular risk factors include: Aortic stenosis, hypertension, type 2 diabetes without insulin use, hyperlipidemia, former smoker, TIA. ? ?Patient is being followed by the practice for management of aortic stenosis. ? ?Patient has had aortic stenosis for some time which is being managed medically.  Her recent echocardiogram is consistent with moderate to severe aortic stenosis but clinically has remained asymptomatic.  She has been delaying other surgical procedures such as breast work-up and knee replacement given her valve.  Therefore she was scheduled for exercise nuclear stress test for further evaluation.  Patient has acceptable functional capacity for age, did not experience chest pain and myocardial perfusion overall preserved overall low risk study. ? ?Clinically she denies angina pectoris, heart failure symptoms, or syncope. ? ?Patient is accompanied by her sister Edmonia James who also provides collateral history. ? ?ALLERGIES: ?No Known Allergies ? ?MEDICATION LIST PRIOR TO VISIT: ?Current Meds  ?Medication Sig  ? atorvastatin (LIPITOR) 10 MG tablet Take 1 tablet by mouth at bedtime.  ? DILT-XR 240 MG 24 hr capsule Take 240 mg by mouth daily.  ? glimepiride (AMARYL) 1 MG tablet Take 1 tablet by mouth daily.  ? Lancets (ONETOUCH DELICA PLUS LANCET33G) MISC Apply 1 each topically daily.  ? losartan (COZAAR) 25 MG tablet TAKE 1 TABLET(25 MG) BY MOUTH IN THE MORNING  ? metoprolol succinate (TOPROL-XL) 25 MG 24 hr tablet TAKE 1  TABLET(25 MG) BY MOUTH DAILY  ? ONETOUCH ULTRA test strip 1 each daily.  ? potassium chloride SA (KLOR-CON) 20 MEQ tablet Take 20 mEq by mouth daily.  ?  ? ?PAST MEDICAL HISTORY: ?Past Medical History:  ?Diagnosis Date  ? Aortic stenosis   ? Diabetes mellitus without complication (HCC)   ? Hyperlipidemia   ? Hypertension   ? TIA (transient ischemic attack)   ? ? ?PAST SURGICAL HISTORY: ?Past Surgical History:  ?Procedure Laterality Date  ? CATARACT EXTRACTION Bilateral 11/27/2020  ? ? ?FAMILY HISTORY: ?The patient family history includes Cancer in her brother; Cancer - Colon in her mother; Cancer - Lung in her brother; Heart attack in her brother; Hypertension in her brother, father, and sister. ? ?SOCIAL HISTORY:  ?The patient  reports that she quit smoking about 33 years ago. Her smoking use included cigarettes. She has a 7.00 pack-year smoking history. She has never used smokeless tobacco. She reports current alcohol use of about 2.0 standard drinks per week. She reports that she does not use drugs. ? ?REVIEW OF SYSTEMS: ?Review of Systems  ?Constitutional: Negative for malaise/fatigue.  ?Cardiovascular:  Negative for chest pain, dyspnea on exertion, leg swelling, near-syncope, orthopnea, palpitations, paroxysmal nocturnal dyspnea and syncope.  ?Respiratory:  Negative for snoring.   ?Hematologic/Lymphatic: Negative for bleeding problem. Does not bruise/bleed easily.  ?Gastrointestinal:  Negative for melena.  ? ?PHYSICAL EXAM: ? ?  08/13/2021  ? 11:32 AM 07/16/2021  ?  1:27 PM 12/11/2020  ?  2:24 PM  ?Vitals with BMI  ?Height 5\' 2"  5\' 2"  5\' 2"   ?Weight 171 lbs 3 oz 169 lbs 166 lbs  3 oz  ?BMI 31.31 30.9 30.39  ?Systolic 137 149 606  ?Diastolic 64 80 82  ?Pulse 70 71 67  ? ? ?CONSTITUTIONAL: Well-developed and well-nourished. No acute distress.  ?SKIN: Skin is warm and dry. No rash noted. No cyanosis. No pallor. No jaundice ?HEAD: Normocephalic and atraumatic.  ?EYES: No scleral icterus ?MOUTH/THROAT: Moist oral  membranes.  ?NECK: No JVD present. No thyromegaly noted. No carotid bruits  ?CHEST Normal respiratory effort. No intercostal retractions  ?LUNGS: Clear to auscultation bilaterally. No stridor. No wheezes. No rales.  ?CARDIOVASCULAR: Normal rate and rhythm. Positive S1, inaudible S2 with harsh systolic murmur right second intercostal space, Systolic murmur at apex radiating to axilla 3/6.  Without gallops or rubs.  Distal pulses intact.  ?ABDOMINAL: Nonobese, soft, nontender, nondistended, positive bowel sounds in all 4 quadrants no apparent ascites.  ?EXTREMITIES: No peripheral edema, warm to touch bilaterally, 2+ dorsalis pedis and posterior tibial pulses ?HEMATOLOGIC: No significant bruising ?NEUROLOGIC: Oriented to person, place, and time. Nonfocal. Normal muscle tone.  ?PSYCHIATRIC: Normal mood and affect. Normal behavior. Cooperative ? ?CARDIAC DATABASE: ?EKG: ?07/16/2021: NSR, 72bpm, normal axis, without underlying injury pattern. ? ?Echocardiogram: ?05/22/2020: ?LVEF 55 to 60%, no regional wall motion abnormalities, mild LVH, grade 2 diastolic dysfunction, moderately dilated left atrium, mild MR, severe aortic stenosis (AVA by VTI 0.65 cm?, AVA 5 telemetry 0.74 cm?, peak velocity 3.5 m/s, mean gradient 34.6 mmHg, dimensionless index 0.19) ? ?12/06/2020: ?LVEF 55 to 60%, no regional wall motion abnormality, mild LVH, indeterminate diastolic function, elevated LAP, GLS -22.4%, mild MR, trileaflet aortic valve, moderate/severe aortic stenosis (peak velocity 3.8 m/s, mean gradient 34.6 mmHg, AVA per VTI 0.7 cm?, dimensional index 0.25) ? ?06/21/2021: ? 1. Left ventricular ejection fraction, by estimation, is 60 to 65%. The left ventricle has normal function. The left ventricle has no regional wall motion abnormalities. There is mild left ventricular hypertrophy. Left ventricular diastolic parameters  ?are consistent with Grade II diastolic dysfunction (pseudonormalization). Elevated left atrial pressure. The E/e' is  22.  ? 2. Right ventricular systolic function is normal. The right ventricular size is normal.  ? 3. Left atrial size was mildly dilated.  ? 4. The mitral valve is degenerative. Mild mitral valve regurgitation. No evidence of mitral stenosis.  ? 5. The aortic valve is tricuspid. There is moderate calcification of the aortic valve. There is moderate thickening of the aortic valve. Aortic valve regurgitation is trivial. Moderate aortic stenosis (peak velocity 4.74m/s, MG , AVA VTI 0.84cm2,  ?DI 0.25).  ? ?Stress Testing: ?Exercise Myoview stress test 08/06/2021: ?Exercise nuclear stress test was performed using Bruce protocol.  ?1 Day Rest/Stress Protocol. ?Exercise time 4 minutes 30 seconds, achieved 6.43 METS, 86% of age-predicted maximum heart rate. ?Stress EKG nondiagnostic for ischemia due to baseline ST-T changes. ?Normal myocardial perfusion without convincing evidence of reversible myocardial ischemia or prior infarct. ?Left ventricular size normal, wall thickness preserved, calculated LVEF 54%. ?Low risk study. ? ?Heart Catheterization: ?None ? ?Carotid duplex: ?05/05/2019 (external): ?No hemodynamically significant stenosis or aberrant flow on either side, normal antegrade flow bilaterally. ? ?LABORATORY DATA: ? ?  Latest Ref Rng & Units 02/08/2020  ?  1:09 PM  ?CBC  ?Hemoglobin 11.1 - 15.9 g/dL 30.1    ?Hematocrit 34.0 - 46.6 % 37.6    ? ? ? ?  Latest Ref Rng & Units 02/15/2020  ?  9:57 AM 02/08/2020  ?  1:09 PM  ?CMP  ?Glucose 65 - 99 mg/dL 601   093    ?  BUN 8 - 27 mg/dL 11   8    ?Creatinine 0.57 - 1.00 mg/dL 1.610.85   0.960.79    ?Sodium 134 - 144 mmol/L 140   136    ?Potassium 3.5 - 5.2 mmol/L 4.0   4.0    ?Chloride 96 - 106 mmol/L 102   104    ?CO2 20 - 29 mmol/L 23   24    ?Calcium 8.7 - 10.3 mg/dL 9.7   9.7    ? ? ?Lipid Panel  ?No results found for: CHOL, TRIG, HDL, CHOLHDL, VLDL, LDLCALC, LDLDIRECT, LABVLDL ? ?No components found for: NTPROBNP ?No results for input(s): PROBNP in the last 8760  hours. ? ?No results for input(s): TSH in the last 8760 hours. ? ?BMP ?No results for input(s): NA, K, CL, CO2, GLUCOSE, BUN, CREATININE, CALCIUM, GFRNONAA, GFRAA in the last 8760 hours. ? ? ?HEMOGLOBIN A1C ?No re

## 2021-08-24 ENCOUNTER — Ambulatory Visit: Payer: Self-pay | Admitting: General Surgery

## 2021-08-24 DIAGNOSIS — N6489 Other specified disorders of breast: Secondary | ICD-10-CM

## 2021-09-11 DIAGNOSIS — D3705 Neoplasm of uncertain behavior of pharynx: Secondary | ICD-10-CM | POA: Diagnosis not present

## 2021-09-11 DIAGNOSIS — R070 Pain in throat: Secondary | ICD-10-CM | POA: Diagnosis not present

## 2021-09-26 DIAGNOSIS — H43393 Other vitreous opacities, bilateral: Secondary | ICD-10-CM | POA: Diagnosis not present

## 2021-09-26 DIAGNOSIS — H43819 Vitreous degeneration, unspecified eye: Secondary | ICD-10-CM | POA: Diagnosis not present

## 2021-10-08 ENCOUNTER — Ambulatory Visit
Admission: RE | Admit: 2021-10-08 | Discharge: 2021-10-08 | Disposition: A | Discharge status: Home / Self Care | Payer: Medicare Other | Source: Ambulatory Visit | Attending: Internal Medicine | Admitting: Internal Medicine

## 2021-10-08 ENCOUNTER — Other Ambulatory Visit: Payer: Self-pay | Admitting: Internal Medicine

## 2021-10-08 DIAGNOSIS — M549 Dorsalgia, unspecified: Secondary | ICD-10-CM | POA: Diagnosis not present

## 2021-10-08 DIAGNOSIS — M4716 Other spondylosis with myelopathy, lumbar region: Secondary | ICD-10-CM | POA: Diagnosis not present

## 2021-10-08 DIAGNOSIS — M545 Low back pain, unspecified: Secondary | ICD-10-CM | POA: Diagnosis not present

## 2021-10-08 DIAGNOSIS — E1169 Type 2 diabetes mellitus with other specified complication: Secondary | ICD-10-CM | POA: Diagnosis not present

## 2021-10-08 DIAGNOSIS — M4714 Other spondylosis with myelopathy, thoracic region: Secondary | ICD-10-CM | POA: Diagnosis not present

## 2021-10-14 ENCOUNTER — Other Ambulatory Visit: Payer: Self-pay | Admitting: Cardiology

## 2021-10-14 DIAGNOSIS — I10 Essential (primary) hypertension: Secondary | ICD-10-CM

## 2021-10-14 DIAGNOSIS — I350 Nonrheumatic aortic (valve) stenosis: Secondary | ICD-10-CM

## 2021-10-19 ENCOUNTER — Other Ambulatory Visit: Payer: Self-pay | Admitting: Cardiology

## 2021-10-19 DIAGNOSIS — I350 Nonrheumatic aortic (valve) stenosis: Secondary | ICD-10-CM

## 2021-10-19 DIAGNOSIS — I10 Essential (primary) hypertension: Secondary | ICD-10-CM

## 2021-10-23 DIAGNOSIS — N6489 Other specified disorders of breast: Secondary | ICD-10-CM | POA: Diagnosis not present

## 2021-11-05 DIAGNOSIS — M546 Pain in thoracic spine: Secondary | ICD-10-CM | POA: Diagnosis not present

## 2021-11-06 ENCOUNTER — Other Ambulatory Visit: Payer: Self-pay | Admitting: General Surgery

## 2021-11-06 DIAGNOSIS — Z09 Encounter for follow-up examination after completed treatment for conditions other than malignant neoplasm: Secondary | ICD-10-CM

## 2021-11-18 ENCOUNTER — Encounter: Payer: Self-pay | Admitting: Cardiology

## 2021-11-19 ENCOUNTER — Other Ambulatory Visit: Payer: Self-pay | Admitting: General Surgery

## 2021-11-19 DIAGNOSIS — N6489 Other specified disorders of breast: Secondary | ICD-10-CM

## 2021-11-23 ENCOUNTER — Ambulatory Visit: Payer: Medicare Other | Admitting: Podiatrist

## 2021-11-23 ENCOUNTER — Encounter: Payer: Self-pay | Admitting: Podiatrist

## 2021-11-23 ENCOUNTER — Ambulatory Visit (INDEPENDENT_AMBULATORY_CARE_PROVIDER_SITE_OTHER): Payer: Medicare Other

## 2021-11-23 DIAGNOSIS — M779 Enthesopathy, unspecified: Secondary | ICD-10-CM

## 2021-11-23 DIAGNOSIS — M79609 Pain in unspecified limb: Secondary | ICD-10-CM

## 2021-11-23 DIAGNOSIS — B351 Tinea unguium: Secondary | ICD-10-CM | POA: Diagnosis not present

## 2021-11-23 MED ORDER — TRIAMCINOLONE ACETONIDE 10 MG/ML IJ SUSP
10.0000 mg | Freq: Once | INTRAMUSCULAR | Status: AC
  Administered 2021-11-23: 10 mg

## 2021-11-23 NOTE — Progress Notes (Signed)
HPI: Patient is 81 y.o. female who presents today for pain dorsal lateral left foot. Its been burning and painful for about 3 months or so she relates discomfort when walking and states that it has come and gone at times.  But overall is been bothering her for about 3 months..  Denies any injury.  She is also here for a nail trim.    Patient Active Problem List   Diagnosis Date Noted   Abnormal mammogram 07/28/2020   Dyslipidemia 07/28/2020   Essential hypertension 07/28/2020   Gastroesophageal reflux disease 07/28/2020   History of malignant neoplasm of colon 07/28/2020   Type 2 diabetes mellitus with other specified complication (HCC) 07/28/2020   Nonrheumatic aortic valve stenosis     Current Outpatient Medications on File Prior to Visit  Medication Sig Dispense Refill   atorvastatin (LIPITOR) 10 MG tablet Take 1 tablet by mouth at bedtime.     DILT-XR 240 MG 24 hr capsule Take 240 mg by mouth daily.     glimepiride (AMARYL) 1 MG tablet Take 1 tablet by mouth daily.     Lancets (ONETOUCH DELICA PLUS LANCET33G) MISC Apply 1 each topically daily.     losartan (COZAAR) 25 MG tablet TAKE 1 TABLET(25 MG) BY MOUTH IN THE MORNING 90 tablet 3   metoprolol succinate (TOPROL-XL) 25 MG 24 hr tablet TAKE 1 TABLET(25 MG) BY MOUTH DAILY 90 tablet 3   ONETOUCH ULTRA test strip 1 each daily.     potassium chloride SA (KLOR-CON) 20 MEQ tablet Take 20 mEq by mouth daily.     No current facility-administered medications on file prior to visit.    No Known Allergies  Review of Systems No fevers, chills, nausea, muscle aches, no difficulty breathing, no calf pain, no chest pain or shortness of breath.   Physical Exam  GENERAL APPEARANCE: Alert, conversant. Appropriately groomed. No acute distress.   VASCULAR: Pedal pulses palpable 1/4 DP and 1/4 PT bilateral.  Capillary refill time is immediate to all digits,  Proximal to distal cooling it warm to warm.  Digital perfusion adequate.    NEUROLOGIC: sensation is intact to 5.07 monofilament at 5/5 sites bilateral.  Light touch is intact bilateral, vibratory sensation intact bilateral  MUSCULOSKELETAL: acceptable muscle strength, tone and stability bilateral.  No gross boney pedal deformities noted.  Pain along the dorsal lateral aspect of the left foot at the fourth metatarsal base region.  DERMATOLOGIC: skin is warm, supple, and dry.  Color, texture, and turgor of skin within normal limits.  Digital nails are elongated, thickened, friable and fragile with the exception of the left first which his come off and not growing back.  Remainder of the integument is normal.  X-ray evaluation 3 views of the left foot are obtained.  No sign of fracture or dislocation.  No acute osseous abnormalities are noted.  Generalized arthritic changes in the midfoot are noted consistent with age-related changes.   Assessment     ICD-10-CM   1. Capsulitis  M77.9 DG Foot Complete Left    2. Pain due to onychomycosis of nail  B35.1    M79.609        Plan Discussed exam and treatment options.  The patient did well in the past with an injection and would like it but had an injection in the left foot today.  I did agree up of this with alcohol.  10 mg of Kenalog with Marcaine plain at the left lateral midfoot/base fourth metatarsal.  I  debrided the patient's nails with sterile nail nipper and sterile bur without complication.  She tolerated this well.  She will be seen back as needed for follow-up and will call sooner if any problems arise.

## 2021-12-03 ENCOUNTER — Ambulatory Visit (HOSPITAL_COMMUNITY)
Admission: RE | Admit: 2021-12-03 | Discharge: 2021-12-03 | Disposition: A | Discharge status: Home / Self Care | Payer: Medicare Other | Source: Ambulatory Visit | Attending: Cardiology | Admitting: Cardiology

## 2021-12-03 DIAGNOSIS — E785 Hyperlipidemia, unspecified: Secondary | ICD-10-CM | POA: Diagnosis not present

## 2021-12-03 DIAGNOSIS — I350 Nonrheumatic aortic (valve) stenosis: Secondary | ICD-10-CM | POA: Diagnosis not present

## 2021-12-03 DIAGNOSIS — E119 Type 2 diabetes mellitus without complications: Secondary | ICD-10-CM | POA: Insufficient documentation

## 2021-12-03 DIAGNOSIS — I340 Nonrheumatic mitral (valve) insufficiency: Secondary | ICD-10-CM | POA: Insufficient documentation

## 2021-12-03 DIAGNOSIS — Z8673 Personal history of transient ischemic attack (TIA), and cerebral infarction without residual deficits: Secondary | ICD-10-CM | POA: Diagnosis not present

## 2021-12-03 DIAGNOSIS — I119 Hypertensive heart disease without heart failure: Secondary | ICD-10-CM | POA: Insufficient documentation

## 2021-12-03 LAB — ECHOCARDIOGRAM COMPLETE
AR max vel: 0.64 cm2
AV Area VTI: 0.71 cm2
AV Area mean vel: 0.64 cm2
AV Mean grad: 45 mmHg
AV Peak grad: 72.6 mmHg
Ao pk vel: 4.26 m/s
Area-P 1/2: 2.83 cm2
Calc EF: 66.1 %
S' Lateral: 2.7 cm
Single Plane A2C EF: 64.7 %
Single Plane A4C EF: 61.9 %

## 2021-12-03 NOTE — Progress Notes (Signed)
  Echocardiogram 2D Echocardiogram has been performed.  Jacqueline Rich F 12/03/2021, 10:22 AM

## 2021-12-04 DIAGNOSIS — M4714 Other spondylosis with myelopathy, thoracic region: Secondary | ICD-10-CM | POA: Diagnosis not present

## 2021-12-04 DIAGNOSIS — M4716 Other spondylosis with myelopathy, lumbar region: Secondary | ICD-10-CM | POA: Diagnosis not present

## 2021-12-04 DIAGNOSIS — E1169 Type 2 diabetes mellitus with other specified complication: Secondary | ICD-10-CM | POA: Diagnosis not present

## 2021-12-04 DIAGNOSIS — I10 Essential (primary) hypertension: Secondary | ICD-10-CM | POA: Diagnosis not present

## 2021-12-04 NOTE — Progress Notes (Signed)
Called and spoke to patient she is aware of appt

## 2021-12-07 DIAGNOSIS — M65311 Trigger thumb, right thumb: Secondary | ICD-10-CM | POA: Diagnosis not present

## 2021-12-18 DIAGNOSIS — K529 Noninfective gastroenteritis and colitis, unspecified: Secondary | ICD-10-CM | POA: Diagnosis not present

## 2021-12-24 ENCOUNTER — Other Ambulatory Visit: Payer: Self-pay | Admitting: General Surgery

## 2021-12-24 ENCOUNTER — Ambulatory Visit
Admission: RE | Admit: 2021-12-24 | Discharge: 2021-12-24 | Disposition: A | Discharge status: Home / Self Care | Payer: Medicare Other | Source: Ambulatory Visit | Attending: General Surgery | Admitting: General Surgery

## 2021-12-24 DIAGNOSIS — N6489 Other specified disorders of breast: Secondary | ICD-10-CM

## 2021-12-24 DIAGNOSIS — R922 Inconclusive mammogram: Secondary | ICD-10-CM | POA: Diagnosis not present

## 2022-01-14 ENCOUNTER — Encounter: Payer: Self-pay | Admitting: Cardiology

## 2022-01-14 ENCOUNTER — Ambulatory Visit: Payer: Medicare Other | Admitting: Cardiology

## 2022-01-14 VITALS — BP 133/78 | HR 67 | Temp 98.1°F | Resp 16 | Ht 62.0 in | Wt 167.0 lb

## 2022-01-14 DIAGNOSIS — I350 Nonrheumatic aortic (valve) stenosis: Secondary | ICD-10-CM | POA: Diagnosis not present

## 2022-01-14 DIAGNOSIS — I10 Essential (primary) hypertension: Secondary | ICD-10-CM

## 2022-01-14 DIAGNOSIS — E782 Mixed hyperlipidemia: Secondary | ICD-10-CM | POA: Diagnosis not present

## 2022-01-14 DIAGNOSIS — Z8673 Personal history of transient ischemic attack (TIA), and cerebral infarction without residual deficits: Secondary | ICD-10-CM

## 2022-01-14 DIAGNOSIS — Z87891 Personal history of nicotine dependence: Secondary | ICD-10-CM

## 2022-01-14 DIAGNOSIS — I493 Ventricular premature depolarization: Secondary | ICD-10-CM

## 2022-01-14 DIAGNOSIS — E119 Type 2 diabetes mellitus without complications: Secondary | ICD-10-CM

## 2022-01-14 NOTE — Progress Notes (Signed)
ID:  Jacqueline Rich, DOB May 22, 1940, MRN [id number]  PCP:  Leeroy Cha, MD  Cardiologist:  Rex Kras, DO, [facility] (established care 02/08/2020) Former Cardiology Providers: Dr. Saverio Danker [facility], [place])  Date: 01/14/22 Last Office Visit: 08/13/2021  Chief Complaint  Patient presents with   Aortic Stenosis   Follow-up    HPI  Jacqueline Rich is a 81 y.o. female whose past medical history and cardiovascular risk factors include: Aortic stenosis, hypertension, type 2 diabetes without insulin use, hyperlipidemia, former smoker, TIA.  Known history of aortic stenosis for which she has been asymptomatic and therefore she has been followed medically.  Her functional capacity is quite acceptable for her age.  In the past he underwent myocardial perfusion which was noted to be low risk study.  Since last office visit patient denies anginal discomfort, heart failure symptoms, near-syncope or syncopal events.  Her overall functional capacity remains relatively stable.  She was supposed to have lumpectomy but this is currently being monitored clinically according to the patient.  She still has not had her knee replacements as well.  No hospitalizations or urgent care visits since last office encounter.  Her most recent echocardiogram from August 2023 independently reviewed with her and her sister at today's office visit.  Patient is accompanied by her sister Jacqueline Rich who also provides collateral history.  ALLERGIES: No Known Allergies  MEDICATION LIST PRIOR TO VISIT: Current Meds  Medication Sig   atorvastatin (LIPITOR) 10 MG tablet Take 1 tablet by mouth at bedtime.   DILT-XR 240 MG 24 hr capsule Take 240 mg by mouth daily.   glimepiride (AMARYL) 1 MG tablet Take 1 tablet by mouth daily.   Lancets (ONETOUCH DELICA PLUS [id number]) MISC Apply 1 each topically daily.   losartan (COZAAR) 25 MG tablet TAKE 1 TABLET(25 MG) BY MOUTH IN THE MORNING   metoprolol  succinate (TOPROL-XL) 25 MG 24 hr tablet TAKE 1 TABLET(25 MG) BY MOUTH DAILY   ONETOUCH ULTRA test strip 1 each daily.   potassium chloride SA (KLOR-CON) 20 MEQ tablet Take 20 mEq by mouth daily.     PAST MEDICAL HISTORY: Past Medical History:  Diagnosis Date   Aortic stenosis    Diabetes mellitus without complication (HCC)    Hyperlipidemia    Hypertension    TIA (transient ischemic attack)     PAST SURGICAL HISTORY: Past Surgical History:  Procedure Laterality Date   CATARACT EXTRACTION Bilateral 11/27/2020    FAMILY HISTORY: The patient family history includes Cancer in her brother; Cancer - Colon in her mother; Cancer - Lung in her brother; Heart attack in her brother; Hypertension in her brother, father, and sister.  SOCIAL HISTORY:  The patient  reports that she quit smoking about 33 years ago. Her smoking use included cigarettes. She has a 7.00 pack-year smoking history. She has never used smokeless tobacco. She reports current alcohol use of about 2.0 standard drinks of alcohol per week. She reports that she does not use drugs.  REVIEW OF SYSTEMS: Review of Systems  Constitutional: Negative for malaise/fatigue.  Cardiovascular:  Negative for chest pain, dyspnea on exertion, leg swelling, near-syncope, orthopnea, palpitations, paroxysmal nocturnal dyspnea and syncope.  Respiratory:  Negative for snoring.   Hematologic/Lymphatic: Negative for bleeding problem. Does not bruise/bleed easily.  Gastrointestinal:  Negative for melena.    PHYSICAL EXAM:    01/14/2022   10:48 AM 08/13/2021   11:32 AM 07/16/2021    1:27 PM  Vitals with BMI  Height 5'  2" _0  _1   Weight 167 lbs 171 lbs 3 oz 169 lbs  BMI 30.54 73.42 87.6  Systolic 811 572 620  Diastolic 78 64 80  Pulse 67 70 71    CONSTITUTIONAL: Well-developed and well-nourished. No acute distress.  SKIN: Skin is warm and dry. No rash noted. No cyanosis. No pallor. No jaundice HEAD: Normocephalic and atraumatic.   EYES: No scleral icterus MOUTH/THROAT: Moist oral membranes.  NECK: No JVD present. No thyromegaly noted. Delayed carotid upstrokes.  CHEST Normal respiratory effort. No intercostal retractions  LUNGS: Clear to auscultation bilaterally. No stridor. No wheezes. No rales.  CARDIOVASCULAR: Normal rate and rhythm. Positive S1, inaudible S2 with harsh systolic murmur right second intercostal space, Systolic murmur at apex radiating to axilla 3/6.  Without gallops or rubs.  Distal pulses intact.  ABDOMINAL: Nonobese, soft, nontender, nondistended, positive bowel sounds in all 4 quadrants no apparent ascites.  EXTREMITIES: No peripheral edema, warm to touch bilaterally, 2+ dorsalis pedis and posterior tibial pulses HEMATOLOGIC: No significant bruising NEUROLOGIC: Oriented to person, place, and time. Nonfocal. Normal muscle tone.  PSYCHIATRIC: Normal mood and affect. Normal behavior. Cooperative No significant change in physical examination since last office encounter.  CARDIAC DATABASE: EKG: 01/14/2022: Normal sinus rhythm, 63 bpm, without underlying ischemia injury pattern  Echocardiogram: 05/22/2020: LVEF 55 to 35%, grade 2 diastolic dysfunction, moderately dilated left atrium, mild MR, severe aortic stenosis (AVA by VTI 0.65 cm, AVA 5 telemetry 0.74 cm, peak velocity 3.5 m/s, mean gradient 34.6 mmHg, dimensionless index 0.19)  12/06/2020: LVEF 55 to 60%, elevated LAP, GLS -22.4%, mild MR, trileaflet aortic valve, moderate/severe aortic stenosis (peak velocity 3.8 m/s, mean gradient 34.6 mmHg, AVA per VTI 0.7 cm, dimensional index 0.25)  12/03/2021:  1. Left ventricular ejection fraction, by estimation, is 60 to 65%. The left ventricle has normal function. The left ventricle has no regional wall motion abnormalities. There is moderate left ventricular hypertrophy. Left ventricular diastolic  parameters are consistent with Grade II diastolic dysfunction (accuracy may be limited due to MAC).  Elevated left atrial pressure. The E/e' is 17.8.   2. Right ventricular systolic function is normal. The right ventricular size is normal. Tricuspid regurgitation signal is inadequate for assessing PA pressure.   3. Left atrial size was mildly dilated.   4. The mitral valve is degenerative. Mild mitral valve regurgitation. No evidence of mitral stenosis. Moderate mitral annular calcification.   5. The aortic valve is tricuspid. There is moderate calcification of the aortic valve. Aortic valve regurgitation is not visualized. Severe aortic valve stenosis (peak velocity 4.25ms, MG 445mG, AVA by VTI 0.6494mDI 0.22).   6. The inferior vena cava is normal in size with greater than 50% respiratory variability, suggesting right atrial pressure of 3 mmHg.   Comparison(s): Prior 06/21/2021: LVEF 60-65%, no RWMA, Grade II diastolic dysfunction, elevated LAP (E/e' 22), Severe AS (peak velocity 4.56m56mMG 42mm11mAVA by VTI 0.84m2,33m0.25).   Stress Testing: Exercise Myoview stress test 08/06/2021: Exercise nuclear stress test was performed using Bruce protocol.  1 Day Rest/Stress Protocol. Exercise time 4 minutes 30 seconds, achieved 6.43 METS, 86% of age-predicted maximum heart rate. Stress EKG nondiagnostic for ischemia due to baseline ST-T changes. Normal myocardial perfusion without convincing evidence of reversible myocardial ischemia or prior infarct. Left ventricular size normal, wall thickness preserved, calculated LVEF 54%. Low risk study.  Heart Catheterization: None  Carotid duplex: 05/05/2019 (external): No hemodynamically significant stenosis or aberrant flow on either side, normal antegrade  flow bilaterally.  LABORATORY DATA:    Latest Ref Rng & Units 02/08/2020    1:09 PM  CBC  Hemoglobin 11.1 - 15.9 g/dL 12.6   Hematocrit 34.0 - 46.6 % 37.6        Latest Ref Rng & Units 02/15/2020    9:57 AM 02/08/2020    1:09 PM  CMP  Glucose 65 - 99 mg/dL 126  101   BUN 8 - 27  mg/dL 11  8   Creatinine 0.57 - 1.00 mg/dL 0.85  0.79   Sodium 134 - 144 mmol/L 140  136   Potassium 3.5 - 5.2 mmol/L 4.0  4.0   Chloride 96 - 106 mmol/L 102  104   CO2 20 - 29 mmol/L 23  24   Calcium 8.7 - 10.3 mg/dL 9.7  9.7     Lipid Panel  No results found for: "CHOL", "TRIG", "HDL", "CHOLHDL", "VLDL", "LDLCALC", "LDLDIRECT", "LABVLDL"  No components found for: "NTPROBNP" No results for input(s): "PROBNP" in the last 8760 hours.  No results for input(s): "TSH" in the last 8760 hours.  BMP No results for input(s): "NA", "K", "CL", "CO2", "GLUCOSE", "BUN", "CREATININE", "CALCIUM", "GFRNONAA", "GFRAA" in the last 8760 hours.   HEMOGLOBIN A1C No results found for: "HGBA1C", "MPG"  IMPRESSION:    ICD-10-CM   1. Nonrheumatic aortic valve stenosis  I35.0 EKG 12-Lead    ECHOCARDIOGRAM COMPLETE    2. Benign hypertension  I10     3. PVC (premature ventricular contraction)  I49.3     4. Type 2 diabetes mellitus without complication, without long-term current use of insulin (HCC)  E11.9     5. Mixed hyperlipidemia  E78.2     6. Hx of TIA (transient ischemic attack) and stroke  Z86.73     7. Former smoker  Z87.891        RECOMMENDATIONS: Jacqueline Rich is a 81 y.o. female whose past medical history and cardiac risk factors include: Hypertension, type 2 diabetes without insulin use, hyperlipidemia, former smoker, TIA, family history of CHF, sedentary lifestyle.  Nonrheumatic aortic valve stenosis Remains asymptomatic. Echo from August 2023 reviewed with the patient-she does have severe arctic stenosis.  She has been asked to be more cognizant for symptoms of new onset of chest pain, syncope, heart failure. I have asked her to increase physical activity as tolerated under supervision (i.e. with sister's presents).  Left symptomatic to come back sooner than her scheduled appointment. We will repeat echocardiogram in February 2024  Benign hypertension Office blood  pressures are well controlled. No change in medical therapy.  PVC (premature ventricular contraction) EKG shows sinus rhythm without ectopy. Asymptomatic. Continue AV nodal blocking agents. Monitor peripherally  Type 2 diabetes mellitus without complication, without long-term current use of insulin ([place]) We educated on importance of glycemic control. Medications reconciled. Currently managed by primary care provider.  Mixed hyperlipidemia Currently on atorvastatin.   She denies myalgia or other side effects. Currently managed by primary care provider.  FINAL MEDICATION LIST END OF ENCOUNTER: No orders of the defined types were placed in this encounter.   There are no discontinued medications.    Current Outpatient Medications:    atorvastatin (LIPITOR) 10 MG tablet, Take 1 tablet by mouth at bedtime., Disp: , Rfl:    DILT-XR 240 MG 24 hr capsule, Take 240 mg by mouth daily., Disp: , Rfl:    glimepiride (AMARYL) 1 MG tablet, Take 1 tablet by mouth daily., Disp: , Rfl:  Lancets (ONETOUCH DELICA PLUS [id number]) MISC, Apply 1 each topically daily., Disp: , Rfl:    losartan (COZAAR) 25 MG tablet, TAKE 1 TABLET(25 MG) BY MOUTH IN THE MORNING, Disp: 90 tablet, Rfl: 3   metoprolol succinate (TOPROL-XL) 25 MG 24 hr tablet, TAKE 1 TABLET(25 MG) BY MOUTH DAILY, Disp: 90 tablet, Rfl: 3   ONETOUCH ULTRA test strip, 1 each daily., Disp: , Rfl:    potassium chloride SA (KLOR-CON) 20 MEQ tablet, Take 20 mEq by mouth daily., Disp: , Rfl:   Orders Placed This Encounter  Procedures   EKG 12-Lead   ECHOCARDIOGRAM COMPLETE    There are no Patient Instructions on file for this visit.   --Continue cardiac medications as reconciled in final medication list. --No follow-ups on file. Or sooner if needed. --Continue follow-up with your primary care physician regarding the management of your other chronic comorbid conditions.  Patient's questions and concerns were addressed to her satisfaction.  She voices understanding of the instructions provided during this encounter.   This note was created using a voice recognition software as a result there may be grammatical errors inadvertently enclosed that do not reflect the nature of this encounter. Every attempt is made to correct such errors.  Rex Kras, [place], [facility]  Pager: [phone] Office: [phone]

## 2022-02-01 ENCOUNTER — Ambulatory Visit
Admission: RE | Admit: 2022-02-01 | Discharge: 2022-02-01 | Disposition: A | Discharge status: Home / Self Care | Payer: Medicare Other | Source: Ambulatory Visit | Attending: Internal Medicine | Admitting: Internal Medicine

## 2022-02-01 DIAGNOSIS — Z780 Asymptomatic menopausal state: Secondary | ICD-10-CM | POA: Diagnosis not present

## 2022-02-01 DIAGNOSIS — E2839 Other primary ovarian failure: Secondary | ICD-10-CM

## 2022-02-27 DIAGNOSIS — H5203 Hypermetropia, bilateral: Secondary | ICD-10-CM | POA: Diagnosis not present

## 2022-03-13 DIAGNOSIS — D3705 Neoplasm of uncertain behavior of pharynx: Secondary | ICD-10-CM | POA: Diagnosis not present

## 2022-03-13 DIAGNOSIS — R070 Pain in throat: Secondary | ICD-10-CM | POA: Diagnosis not present

## 2022-04-03 DIAGNOSIS — Z0000 Encounter for general adult medical examination without abnormal findings: Secondary | ICD-10-CM | POA: Diagnosis not present

## 2022-04-03 DIAGNOSIS — I10 Essential (primary) hypertension: Secondary | ICD-10-CM | POA: Diagnosis not present

## 2022-04-03 DIAGNOSIS — Z1389 Encounter for screening for other disorder: Secondary | ICD-10-CM | POA: Diagnosis not present

## 2022-04-03 DIAGNOSIS — E1169 Type 2 diabetes mellitus with other specified complication: Secondary | ICD-10-CM | POA: Diagnosis not present

## 2022-04-03 DIAGNOSIS — I350 Nonrheumatic aortic (valve) stenosis: Secondary | ICD-10-CM | POA: Diagnosis not present

## 2022-04-03 DIAGNOSIS — Z23 Encounter for immunization: Secondary | ICD-10-CM | POA: Diagnosis not present

## 2022-04-09 ENCOUNTER — Ambulatory Visit
Admission: RE | Admit: 2022-04-09 | Discharge: 2022-04-09 | Disposition: A | Discharge status: Home / Self Care | Payer: Medicare Other | Source: Ambulatory Visit | Attending: General Surgery | Admitting: General Surgery

## 2022-04-09 DIAGNOSIS — R922 Inconclusive mammogram: Secondary | ICD-10-CM | POA: Diagnosis not present

## 2022-04-09 DIAGNOSIS — N6489 Other specified disorders of breast: Secondary | ICD-10-CM

## 2022-05-03 DIAGNOSIS — M542 Cervicalgia: Secondary | ICD-10-CM | POA: Diagnosis not present

## 2022-05-15 ENCOUNTER — Other Ambulatory Visit: Payer: Self-pay

## 2022-05-15 ENCOUNTER — Inpatient Hospital Stay (HOSPITAL_COMMUNITY)
Admission: EM | Admit: 2022-05-15 | Discharge: 2022-05-18 | DRG: 378 | Disposition: A | Discharge status: Home / Self Care | Payer: Medicare Other | Attending: Internal Medicine | Admitting: Internal Medicine

## 2022-05-15 DIAGNOSIS — E669 Obesity, unspecified: Secondary | ICD-10-CM | POA: Diagnosis not present

## 2022-05-15 DIAGNOSIS — Z7984 Long term (current) use of oral hypoglycemic drugs: Secondary | ICD-10-CM | POA: Diagnosis not present

## 2022-05-15 DIAGNOSIS — Z8249 Family history of ischemic heart disease and other diseases of the circulatory system: Secondary | ICD-10-CM

## 2022-05-15 DIAGNOSIS — Z9841 Cataract extraction status, right eye: Secondary | ICD-10-CM

## 2022-05-15 DIAGNOSIS — K648 Other hemorrhoids: Secondary | ICD-10-CM | POA: Diagnosis present

## 2022-05-15 DIAGNOSIS — Z6830 Body mass index (BMI) 30.0-30.9, adult: Secondary | ICD-10-CM

## 2022-05-15 DIAGNOSIS — K922 Gastrointestinal hemorrhage, unspecified: Secondary | ICD-10-CM | POA: Diagnosis not present

## 2022-05-15 DIAGNOSIS — E1169 Type 2 diabetes mellitus with other specified complication: Secondary | ICD-10-CM | POA: Diagnosis not present

## 2022-05-15 DIAGNOSIS — Z79899 Other long term (current) drug therapy: Secondary | ICD-10-CM | POA: Diagnosis not present

## 2022-05-15 DIAGNOSIS — I10 Essential (primary) hypertension: Secondary | ICD-10-CM | POA: Diagnosis not present

## 2022-05-15 DIAGNOSIS — Z800 Family history of malignant neoplasm of digestive organs: Secondary | ICD-10-CM

## 2022-05-15 DIAGNOSIS — Z8673 Personal history of transient ischemic attack (TIA), and cerebral infarction without residual deficits: Secondary | ICD-10-CM

## 2022-05-15 DIAGNOSIS — Z980 Intestinal bypass and anastomosis status: Secondary | ICD-10-CM | POA: Diagnosis not present

## 2022-05-15 DIAGNOSIS — R58 Hemorrhage, not elsewhere classified: Secondary | ICD-10-CM | POA: Diagnosis not present

## 2022-05-15 DIAGNOSIS — K219 Gastro-esophageal reflux disease without esophagitis: Secondary | ICD-10-CM | POA: Diagnosis not present

## 2022-05-15 DIAGNOSIS — E785 Hyperlipidemia, unspecified: Secondary | ICD-10-CM | POA: Diagnosis present

## 2022-05-15 DIAGNOSIS — Z9842 Cataract extraction status, left eye: Secondary | ICD-10-CM

## 2022-05-15 DIAGNOSIS — Z87891 Personal history of nicotine dependence: Secondary | ICD-10-CM | POA: Diagnosis not present

## 2022-05-15 DIAGNOSIS — K625 Hemorrhage of anus and rectum: Secondary | ICD-10-CM | POA: Diagnosis not present

## 2022-05-15 DIAGNOSIS — K644 Residual hemorrhoidal skin tags: Secondary | ICD-10-CM | POA: Diagnosis not present

## 2022-05-15 DIAGNOSIS — I350 Nonrheumatic aortic (valve) stenosis: Secondary | ICD-10-CM | POA: Diagnosis not present

## 2022-05-15 DIAGNOSIS — D62 Acute posthemorrhagic anemia: Secondary | ICD-10-CM | POA: Diagnosis not present

## 2022-05-15 DIAGNOSIS — K5791 Diverticulosis of intestine, part unspecified, without perforation or abscess with bleeding: Secondary | ICD-10-CM | POA: Diagnosis not present

## 2022-05-15 DIAGNOSIS — K5730 Diverticulosis of large intestine without perforation or abscess without bleeding: Secondary | ICD-10-CM | POA: Diagnosis not present

## 2022-05-15 DIAGNOSIS — K5731 Diverticulosis of large intestine without perforation or abscess with bleeding: Principal | ICD-10-CM | POA: Diagnosis present

## 2022-05-15 LAB — CBC WITH DIFFERENTIAL/PLATELET
Abs Immature Granulocytes: 0.05 10*3/uL (ref 0.00–0.07)
Basophils Absolute: 0.1 10*3/uL (ref 0.0–0.1)
Basophils Relative: 1 %
Eosinophils Absolute: 0.2 10*3/uL (ref 0.0–0.5)
Eosinophils Relative: 2 %
HCT: 29.1 % — ABNORMAL LOW (ref 36.0–46.0)
Hemoglobin: 9.6 g/dL — ABNORMAL LOW (ref 12.0–15.0)
Immature Granulocytes: 1 %
Lymphocytes Relative: 15 %
Lymphs Abs: 1.4 10*3/uL (ref 0.7–4.0)
MCH: 29.5 pg (ref 26.0–34.0)
MCHC: 33 g/dL (ref 30.0–36.0)
MCV: 89.5 fL (ref 80.0–100.0)
Monocytes Absolute: 0.4 10*3/uL (ref 0.1–1.0)
Monocytes Relative: 4 %
Neutro Abs: 7.7 10*3/uL (ref 1.7–7.7)
Neutrophils Relative %: 77 %
Platelets: 306 10*3/uL (ref 150–400)
RBC: 3.25 MIL/uL — ABNORMAL LOW (ref 3.87–5.11)
RDW: 13.5 % (ref 11.5–15.5)
WBC: 9.9 10*3/uL (ref 4.0–10.5)
nRBC: 0 % (ref 0.0–0.2)

## 2022-05-15 LAB — COMPREHENSIVE METABOLIC PANEL
ALT: 13 U/L (ref 0–44)
AST: 19 U/L (ref 15–41)
Albumin: 3.5 g/dL (ref 3.5–5.0)
Alkaline Phosphatase: 72 U/L (ref 38–126)
Anion gap: 6 (ref 5–15)
BUN: 18 mg/dL (ref 8–23)
CO2: 24 mmol/L (ref 22–32)
Calcium: 8.7 mg/dL — ABNORMAL LOW (ref 8.9–10.3)
Chloride: 105 mmol/L (ref 98–111)
Creatinine, Ser: 0.89 mg/dL (ref 0.44–1.00)
GFR, Estimated: >60 mL/min (ref >60)
Glucose, Bld: 171 mg/dL — ABNORMAL HIGH (ref 70–99)
Potassium: 4.2 mmol/L (ref 3.5–5.1)
Sodium: 135 mmol/L (ref 135–145)
Total Bilirubin: 0.3 mg/dL (ref 0.3–1.2)
Total Protein: 7.7 g/dL (ref 6.5–8.1)

## 2022-05-15 LAB — IRON AND TIBC
Iron: 44 ug/dL (ref 28–170)
Saturation Ratios: 15 % (ref 10.4–31.8)
TIBC: 300 ug/dL (ref 250–450)
UIBC: 256 ug/dL

## 2022-05-15 LAB — HEMOGLOBIN AND HEMATOCRIT, BLOOD
HCT: 25.9 % — ABNORMAL LOW (ref 36.0–46.0)
Hemoglobin: 8.4 g/dL — ABNORMAL LOW (ref 12.0–15.0)

## 2022-05-15 LAB — I-STAT CHEM 8, ED
BUN: 17 mg/dL (ref 8–23)
Calcium, Ion: 1.26 mmol/L (ref 1.15–1.40)
Chloride: 104 mmol/L (ref 98–111)
Creatinine, Ser: 0.8 mg/dL (ref 0.44–1.00)
Glucose, Bld: 161 mg/dL — ABNORMAL HIGH (ref 70–99)
HCT: 30 % — ABNORMAL LOW (ref 36.0–46.0)
Hemoglobin: 10.2 g/dL — ABNORMAL LOW (ref 12.0–15.0)
Potassium: 4.4 mmol/L (ref 3.5–5.1)
Sodium: 138 mmol/L (ref 135–145)
TCO2: 22 mmol/L (ref 22–32)

## 2022-05-15 LAB — POC OCCULT BLOOD, ED: Fecal Occult Bld: POSITIVE — AB

## 2022-05-15 LAB — TYPE AND SCREEN
ABO/RH(D): O POS
Antibody Screen: NEGATIVE

## 2022-05-15 LAB — HEMOGLOBIN A1C
Hgb A1c MFr Bld: 5.9 % — ABNORMAL HIGH (ref 4.8–5.6)
Mean Plasma Glucose: 122.63 mg/dL

## 2022-05-15 LAB — CBG MONITORING, ED
Glucose-Capillary: 158 mg/dL — ABNORMAL HIGH (ref 70–99)
Glucose-Capillary: 80 mg/dL (ref 70–99)

## 2022-05-15 LAB — PROTIME-INR
INR: 1.1 (ref 0.8–1.2)
Prothrombin Time: 13.9 s (ref 11.4–15.2)

## 2022-05-15 MED ORDER — DILTIAZEM HCL ER COATED BEADS 240 MG PO CP24
240.0000 mg | ORAL_CAPSULE | Freq: Every day | ORAL | Status: DC
  Administered 2022-05-16 – 2022-05-18 (×3): 240 mg via ORAL
  Filled 2022-05-15 (×4): qty 1

## 2022-05-15 MED ORDER — METOPROLOL SUCCINATE ER 25 MG PO TB24
25.0000 mg | ORAL_TABLET | Freq: Every day | ORAL | Status: DC
  Administered 2022-05-16 – 2022-05-18 (×3): 25 mg via ORAL
  Filled 2022-05-15 (×3): qty 1

## 2022-05-15 MED ORDER — INSULIN ASPART 100 UNIT/ML IJ SOLN
0.0000 [IU] | Freq: Three times a day (TID) | INTRAMUSCULAR | Status: DC
  Administered 2022-05-16: 2 [IU] via SUBCUTANEOUS
  Filled 2022-05-15: qty 0.15

## 2022-05-15 MED ORDER — ONDANSETRON HCL 4 MG PO TABS: 4.0000 mg | ORAL_TABLET | Freq: Four times a day (QID) | ORAL | Status: DC | PRN

## 2022-05-15 MED ORDER — ACETAMINOPHEN 650 MG RE SUPP: 650.0000 mg | Freq: Four times a day (QID) | RECTAL | Status: DC | PRN

## 2022-05-15 MED ORDER — ONDANSETRON HCL 4 MG/2ML IJ SOLN: 4.0000 mg | Freq: Four times a day (QID) | INTRAMUSCULAR | Status: DC | PRN

## 2022-05-15 MED ORDER — SODIUM CHLORIDE 0.9 % IV BOLUS
1000.0000 mL | Freq: Once | INTRAVENOUS | Status: AC
  Administered 2022-05-15: 1000 mL via INTRAVENOUS

## 2022-05-15 MED ORDER — ACETAMINOPHEN 325 MG PO TABS: 650.0000 mg | ORAL_TABLET | Freq: Four times a day (QID) | ORAL | Status: DC | PRN

## 2022-05-15 MED ORDER — LOSARTAN POTASSIUM 50 MG PO TABS
50.0000 mg | ORAL_TABLET | Freq: Every day | ORAL | Status: DC
  Administered 2022-05-16 – 2022-05-18 (×3): 50 mg via ORAL
  Filled 2022-05-15 (×2): qty 1
  Filled 2022-05-15: qty 2

## 2022-05-15 MED ORDER — ATORVASTATIN CALCIUM 10 MG PO TABS
10.0000 mg | ORAL_TABLET | Freq: Every day | ORAL | Status: DC
  Administered 2022-05-16 – 2022-05-18 (×3): 10 mg via ORAL
  Filled 2022-05-15 (×3): qty 1

## 2022-05-15 MED ORDER — PANTOPRAZOLE SODIUM 40 MG IV SOLR
40.0000 mg | Freq: Once | INTRAVENOUS | Status: AC
  Administered 2022-05-15: 40 mg via INTRAVENOUS
  Filled 2022-05-15: qty 10

## 2022-05-15 MED ORDER — PANTOPRAZOLE SODIUM 40 MG IV SOLR
40.0000 mg | INTRAVENOUS | Status: DC
  Administered 2022-05-16 – 2022-05-18 (×3): 40 mg via INTRAVENOUS
  Filled 2022-05-15 (×3): qty 10

## 2022-05-15 NOTE — ED Provider Notes (Signed)
[facility]-EMERGENCY DEPT Provider Note   CSN: [id number] Arrival date & time: 05/15/22  1242     History  Chief Complaint  Patient presents with   Rectal Bleeding    Jacqueline Rich is a 82 y.o. female.  82 yo F with a cc of rectal bleeding.  Patient has had blood in her stool since yesterday reportedly.  Reports it being bright red.  Not on blood thinning medications.  I was called to the patient's room after she had had what sounds like a syncopal event.  Recently after having an IV placed.  Patient yelling and screaming on arrival.  Not able to effectively communicate.  Level 5 caveat altered mental status.   Rectal Bleeding      Home Medications Prior to Admission medications   Medication Sig Start Date End Date Taking? Authorizing Provider  atorvastatin (LIPITOR) 10 MG tablet Take 1 tablet by mouth at bedtime. 02/01/20   [provider]  DILT-XR 240 MG 24 hr capsule Take 240 mg by mouth daily. 02/01/20   [provider]  glimepiride (AMARYL) 1 MG tablet Take 1 tablet by mouth daily. 02/01/20   [provider]  Lancets (ONETOUCH DELICA PLUS [place]) MISC Apply 1 each topically daily. 01/25/20   [provider]  losartan (COZAAR) 25 MG tablet TAKE 1 TABLET(25 MG) BY MOUTH IN THE MORNING 10/19/21   Tolia, Sunit, DO  metoprolol succinate (TOPROL-XL) 25 MG 24 hr tablet TAKE 1 TABLET(25 MG) BY MOUTH DAILY 10/15/21   Tolia, Sunit, DO  ONETOUCH ULTRA test strip 1 each daily. 01/25/20   [provider]  potassium chloride SA (KLOR-CON) 20 MEQ tablet Take 20 mEq by mouth daily. 02/02/20   [provider]      Allergies    Patient has no known allergies.    Review of Systems   Review of Systems  Gastrointestinal:  Positive for hematochezia.    Physical Exam Updated Vital Signs BP (!) 140/87   Pulse 82   Temp 98.2 F (36.8 C) (Oral)   Resp 12   Ht 5\' 2"  (1.575 m)   Wt 75 kg   SpO2 100%   BMI  30.24 kg/m  Physical Exam Vitals and nursing note reviewed.  Constitutional:      General: She is not in acute distress.    Appearance: She is well-developed. She is not diaphoretic.  HENT:     Head: Normocephalic and atraumatic.  Eyes:     Pupils: Pupils are equal, round, and reactive to light.  Cardiovascular:     Rate and Rhythm: Normal rate and regular rhythm.     Heart sounds: No murmur heard.    No friction rub. No gallop.  Pulmonary:     Effort: Pulmonary effort is normal.     Breath sounds: No wheezing or rales.  Abdominal:     General: There is no distension.     Palpations: Abdomen is soft.     Tenderness: There is no abdominal tenderness.  Genitourinary:    Comments: Reddish tinged stool Musculoskeletal:        General: No tenderness.     Cervical back: Normal range of motion and neck supple.  Skin:    General: Skin is warm and dry.  Neurological:     Mental Status: She is alert and oriented to person, place, and time.  Psychiatric:        Behavior: Behavior normal.     ED Results /  Procedures / Treatments   Labs (all labs ordered are listed, but only abnormal results are displayed) Labs Reviewed  COMPREHENSIVE METABOLIC PANEL - Abnormal; Notable for the following components:      Result Value   Glucose, Bld 171 (*)    Calcium 8.7 (*)    All other components within normal limits  CBC WITH DIFFERENTIAL/PLATELET - Abnormal; Notable for the following components:   RBC 3.25 (*)    Hemoglobin 9.6 (*)    HCT 29.1 (*)    All other components within normal limits  CBG MONITORING, ED - Abnormal; Notable for the following components:   Glucose-Capillary 158 (*)    All other components within normal limits  I-STAT CHEM 8, ED - Abnormal; Notable for the following components:   Glucose, Bld 161 (*)    Hemoglobin 10.2 (*)    HCT 30.0 (*)    All other components within normal limits  POC OCCULT BLOOD, ED - Abnormal; Notable for the following components:   Fecal  Occult Bld POSITIVE (*)    All other components within normal limits  PROTIME-INR  TYPE AND SCREEN    EKG EKG Interpretation  Date/Time:  Wednesday May 15 2022 13:35:18 EST Ventricular Rate:  96 PR Interval:  183 QRS Duration: 78 QT Interval:  344 QTC Calculation: 435 R Axis:   64 Text Interpretation: Sinus rhythm Probable left atrial enlargement No significant change since last tracing Confirmed by Deno Etienne [phone]) on 05/15/2022 1:37:44 PM  Radiology No results found.  Procedures Procedures    Medications Ordered in ED Medications  sodium chloride 0.9 % bolus 1,000 mL (1,000 mLs Intravenous New Bag/Given 05/15/22 1420)  pantoprazole (PROTONIX) injection 40 mg (40 mg Intravenous Given 05/15/22 1420)    ED Course/ Medical Decision Making/ A&P                             Medical Decision Making Risk Prescription drug management.   82 yo F with a cc of bright red blood per rectum.  This started yesterday.  I was called to the patient's bedside due to what sounds like a vasovagal syncopal event after having IV placed.  She was difficult to communicate with at that time.  Will obtain a laboratory evaluation bolus of IV fluids observed in the ED.  Patient's hemoglobin is 9.6 down from last check 3 years ago of 12.6.  Patient reassessed and feeling bit better gives me a further history.  Tells me that she has had bright red blood per rectum since yesterday.  Approximately 4 events.  No dark or tarry stool that she noted.  No vomiting of blood.  No abdominal pain.  I discussed case with Dr. Alessandra Bevels, [facility].  Agrees with medical admission and will see the patient in the morning.  Metabolic panel is resulted and BUN is normal.  Discussed the case with medicine for admission.  The patients results and plan were reviewed and discussed.   Any x-rays performed were independently reviewed by myself.   Differential diagnosis were considered with the presenting  HPI.  Medications  sodium chloride 0.9 % bolus 1,000 mL (1,000 mLs Intravenous New Bag/Given 05/15/22 1420)  pantoprazole (PROTONIX) injection 40 mg (40 mg Intravenous Given 05/15/22 1420)    Vitals:   05/15/22 1250 05/15/22 1251 05/15/22 1400  BP: 104/66  (!) 140/87  Pulse: 83  82  Resp: 14  12  Temp: 98.2 F (36.8 C)  TempSrc: Oral    SpO2: 100%  100%  Weight:  75 kg   Height:  5\' 2"  (1.575 m)     Final diagnoses:  BRBPR (bright red blood per rectum)    Admission/ observation were discussed with the admitting physician, patient and/or family and they are comfortable with the plan.          Final Clinical Impression(s) / ED Diagnoses Final diagnoses:  BRBPR (bright red blood per rectum)    Rx / DC Orders ED Discharge Orders     None         Deno Etienne, DO 05/15/22 1442

## 2022-05-15 NOTE — H&P (Signed)
History and Physical    Patient: Jacqueline Rich ERX:[id number] DOB: March 27, 1941 DOA: 05/15/2022 DOS: the patient was seen and examined on 05/15/2022 PCP: Leeroy Cha, MD  Patient coming from: Home  Chief Complaint:  Chief Complaint  Patient presents with   Rectal Bleeding   HPI: Jacqueline Rich is a 82 y.o. female with medical history significant of HLD, HTN, DM2, aortic stenosis. Presenting with rectal bleeding. She was in her normal state of health until yesterday morning when so noticed bright red blood with her stool. She didn't have any rectal pain or abdominal pain. She didn't have any CP, dyspnea, palpitations, lightheadedness, dizziness. She had several episodes throughout the day and into this morning. She is not on a blood thinner or ASA. When her symptoms did not improve this morning, she decided to come the ED for evaluation. She denies any other aggravating or alleviating factors.     Review of Systems: As mentioned in the history of present illness. All other systems reviewed and are negative. Past Medical History:  Diagnosis Date   Aortic stenosis    Diabetes mellitus without complication ([place])    Hyperlipidemia    Hypertension    TIA (transient ischemic attack)    Past Surgical History:  Procedure Laterality Date   CATARACT EXTRACTION Bilateral 11/27/2020   Social History:  reports that she quit smoking about 34 years ago. Her smoking use included cigarettes. She has a 7.00 pack-year smoking history. She has never used smokeless tobacco. She reports current alcohol use of about 2.0 standard drinks of alcohol per week. She reports that she does not use drugs.  No Known Allergies  Family History  Problem Relation Age of Onset   Cancer - Colon Mother    Hypertension Father    Cancer - Lung Brother    Hypertension Sister    Cancer Brother    Heart attack Brother    Hypertension Brother     Prior to Admission medications   Medication Sig Start  Date End Date Taking? Authorizing Provider  atorvastatin (LIPITOR) 10 MG tablet Take 1 tablet by mouth at bedtime. 02/01/20   [provider]  DILT-XR 240 MG 24 hr capsule Take 240 mg by mouth daily. 02/01/20   [provider]  glimepiride (AMARYL) 1 MG tablet Take 1 tablet by mouth daily. 02/01/20   [provider]  Lancets (ONETOUCH DELICA PLUS [id number]) [place] 1 each topically daily. 01/25/20   [provider]  losartan (COZAAR) 25 MG tablet TAKE 1 TABLET(25 MG) BY MOUTH IN THE MORNING 10/19/21   Tolia, Sunit, DO  metoprolol succinate (TOPROL-XL) 25 MG 24 hr tablet TAKE 1 TABLET(25 MG) BY MOUTH DAILY 10/15/21   Tolia, Sunit, DO  ONETOUCH ULTRA test strip 1 each daily. 01/25/20   [provider]  potassium chloride SA (KLOR-CON) 20 MEQ tablet Take 20 mEq by mouth daily. 02/02/20   [provider]    Physical Exam: Vitals:   05/15/22 1250 05/15/22 1251 05/15/22 1400 05/15/22 1441  BP: 104/66  (!) 140/87 130/78  Pulse: 83  82 80  Resp: 14  12 17   Temp: 98.2 F (36.8 C)     TempSrc: Oral     SpO2: 100%  100% 99%  Weight:  75 kg    Height:  5\' 2"  (1.575 m)     General: 82 y.o. female resting in bed in NAD Eyes: PERRL, normal sclera ENMT: Nares patent w/o discharge, orophaynx clear, dentition normal, ears w/o  discharge/lesions/ulcers Neck: Supple, trachea midline Cardiovascular: RRR, +S1, S2, no m/g/r, equal pulses throughout Respiratory: CTABL, no w/r/r, normal WOB GI: BS+, NDNT, no masses noted, no organomegaly noted MSK: No e/c/c Neuro: A&O x 3, no focal deficits Psyc: Appropriate interaction and affect, calm/cooperative  Data Reviewed:  Results for orders placed or performed during the hospital encounter of 05/15/22 (from the past 24 hour(s))  Comprehensive metabolic panel     Status: Abnormal   Collection Time: 05/15/22  1:25 PM  Result Value Ref Range   Sodium 135 135 - 145 mmol/L   Potassium 4.2 3.5 - 5.1 mmol/L    Chloride 105 98 - 111 mmol/L   CO2 24 22 - 32 mmol/L   Glucose, Bld 171 (H) 70 - 99 mg/dL   BUN 18 8 - 23 mg/dL   Creatinine, Ser 1.61 0.44 - 1.00 mg/dL   Calcium 8.7 (L) 8.9 - 10.3 mg/dL   Total Protein 7.7 6.5 - 8.1 g/dL   Albumin 3.5 3.5 - 5.0 g/dL   AST 19 15 - 41 U/L   ALT 13 0 - 44 U/L   Alkaline Phosphatase 72 38 - 126 U/L   Total Bilirubin 0.3 0.3 - 1.2 mg/dL   GFR, Estimated >09 >60 mL/min   Anion gap 6 5 - 15  CBC with Differential     Status: Abnormal   Collection Time: 05/15/22  1:25 PM  Result Value Ref Range   WBC 9.9 4.0 - 10.5 K/uL   RBC 3.25 (L) 3.87 - 5.11 MIL/uL   Hemoglobin 9.6 (L) 12.0 - 15.0 g/dL   HCT 45.4 (L) 09.8 - 11.9 %   MCV 89.5 80.0 - 100.0 fL   MCH 29.5 26.0 - 34.0 pg   MCHC 33.0 30.0 - 36.0 g/dL   RDW 14.7 82.9 - 56.2 %   Platelets 306 150 - 400 K/uL   nRBC 0.0 0.0 - 0.2 %   Neutrophils Relative % 77 %   Neutro Abs 7.7 1.7 - 7.7 K/uL   Lymphocytes Relative 15 %   Lymphs Abs 1.4 0.7 - 4.0 K/uL   Monocytes Relative 4 %   Monocytes Absolute 0.4 0.1 - 1.0 K/uL   Eosinophils Relative 2 %   Eosinophils Absolute 0.2 0.0 - 0.5 K/uL   Basophils Relative 1 %   Basophils Absolute 0.1 0.0 - 0.1 K/uL   Immature Granulocytes 1 %   Abs Immature Granulocytes 0.05 0.00 - 0.07 K/uL  Protime-INR     Status: None   Collection Time: 05/15/22  1:25 PM  Result Value Ref Range   Prothrombin Time 13.9 11.4 - 15.2 seconds   INR 1.1 0.8 - 1.2  CBG monitoring, ED     Status: Abnormal   Collection Time: 05/15/22  1:32 PM  Result Value Ref Range   Glucose-Capillary 158 (H) 70 - 99 mg/dL  POC occult blood, ED Provider will collect     Status: Abnormal   Collection Time: 05/15/22  1:37 PM  Result Value Ref Range   Fecal Occult Bld POSITIVE (A) NEGATIVE  I-stat chem 8, ED (not at [facility], [facility] or [facility])     Status: Abnormal   Collection Time: 05/15/22  1:54 PM  Result Value Ref Range   Sodium 138 135 - 145 mmol/L   Potassium 4.4 3.5 - 5.1 mmol/L   Chloride 104 98 -  111 mmol/L   BUN 17 8 - 23 mg/dL   Creatinine, Ser 1.30 0.44 - 1.00 mg/dL  Glucose, Bld 161 (H) 70 - 99 mg/dL   Calcium, Ion 1.26 1.15 - 1.40 mmol/L   TCO2 22 22 - 32 mmol/L   Hemoglobin 10.2 (L) 12.0 - 15.0 g/dL   HCT 30.0 (L) 36.0 - 46.0 %   EKG: sinus, no st elevations  Assessment and Plan: GIB     - place in obs, tele     - continue PPI     - check iron studies     - follow q6H H&H     - [facility] consulted, appreciate assistance     - FLD tonight, make NPOpMN   DM2     - A1c, SSI, glucose checks, FLD  HTN     - continue home regimen when confirmed  HLD     - continue home regimen when confirmed  GERD     - PPI  Advance Care Planning:   Code Status: FULL  Consults: [facility]  Family Communication: w/ family at bedside  Severity of Illness: The appropriate patient status for this patient is OBSERVATION. Observation status is judged to be reasonable and necessary in order to provide the required intensity of service to ensure the patient's safety. The patient's presenting symptoms, physical exam findings, and initial radiographic and laboratory data in the context of their medical condition is felt to place them at decreased risk for further clinical deterioration. Furthermore, it is anticipated that the patient will be medically stable for discharge from the hospital within 2 midnights of admission.   Author: Jonnie Finner, DO 05/15/2022 2:45 PM  For on call review [email or web address].

## 2022-05-15 NOTE — ED Triage Notes (Signed)
Pt BIB EMS from home c/o bright red blood in stool since yesterday. Pt denies N/V/D.  138/70 BP 79 HR 96% CBG 235  97.2 TEMP

## 2022-05-15 NOTE — ED Notes (Signed)
This RN assisted patient in ambulating to restroom using Denna Haggard sit-to-stand device.

## 2022-05-15 NOTE — ED Provider Triage Note (Signed)
Emergency Medicine Provider Triage Evaluation Note  Jacqueline Rich , a 82 y.o. female  was evaluated in triage.  Pt complains of rectal bleeding since yesterday.  Happens whenever she has to use the bathroom, not at rest.  It is bright red blood, denies noticing dark and tarry stool.  This is never happened before, not on blood thinners, last colonoscopy 3 to 4 years ago reportedly unremarkable.  She denies any abdominal pain, does feel lightheaded..  Review of Systems  PER HPI  Physical Exam  BP 104/66 (BP Location: Left Arm)   Pulse 83   Temp 98.2 F (36.8 C) (Oral)   Resp 14   Ht 5\' 2"  (1.575 m)   Wt 75 kg   SpO2 100%   BMI 30.24 kg/m  Gen:   Awake, no distress   Resp:  Normal effort  MSK:   Moves extremities without difficulty  Other:  Abdomen non tender  Medical Decision Making  Medically screening exam initiated at 1:12 PM.  Appropriate orders placed.  Jacqueline Rich was informed that the remainder of the evaluation will be completed by another provider, this initial triage assessment does not replace that evaluation, and the importance of remaining in the ED until their evaluation is complete.     Sherrill Raring, PA-C 05/15/22 1313

## 2022-05-16 DIAGNOSIS — I350 Nonrheumatic aortic (valve) stenosis: Secondary | ICD-10-CM | POA: Diagnosis present

## 2022-05-16 DIAGNOSIS — K625 Hemorrhage of anus and rectum: Secondary | ICD-10-CM | POA: Diagnosis not present

## 2022-05-16 DIAGNOSIS — Z8249 Family history of ischemic heart disease and other diseases of the circulatory system: Secondary | ICD-10-CM | POA: Diagnosis not present

## 2022-05-16 DIAGNOSIS — I10 Essential (primary) hypertension: Secondary | ICD-10-CM

## 2022-05-16 DIAGNOSIS — Z800 Family history of malignant neoplasm of digestive organs: Secondary | ICD-10-CM | POA: Diagnosis not present

## 2022-05-16 DIAGNOSIS — E785 Hyperlipidemia, unspecified: Secondary | ICD-10-CM | POA: Diagnosis present

## 2022-05-16 DIAGNOSIS — Z9842 Cataract extraction status, left eye: Secondary | ICD-10-CM | POA: Diagnosis not present

## 2022-05-16 DIAGNOSIS — Z7984 Long term (current) use of oral hypoglycemic drugs: Secondary | ICD-10-CM | POA: Diagnosis not present

## 2022-05-16 DIAGNOSIS — D62 Acute posthemorrhagic anemia: Secondary | ICD-10-CM | POA: Diagnosis present

## 2022-05-16 DIAGNOSIS — K644 Residual hemorrhoidal skin tags: Secondary | ICD-10-CM | POA: Diagnosis present

## 2022-05-16 DIAGNOSIS — Z8673 Personal history of transient ischemic attack (TIA), and cerebral infarction without residual deficits: Secondary | ICD-10-CM | POA: Diagnosis not present

## 2022-05-16 DIAGNOSIS — K219 Gastro-esophageal reflux disease without esophagitis: Secondary | ICD-10-CM | POA: Diagnosis present

## 2022-05-16 DIAGNOSIS — K922 Gastrointestinal hemorrhage, unspecified: Secondary | ICD-10-CM | POA: Diagnosis present

## 2022-05-16 DIAGNOSIS — K648 Other hemorrhoids: Secondary | ICD-10-CM | POA: Diagnosis present

## 2022-05-16 DIAGNOSIS — Z9841 Cataract extraction status, right eye: Secondary | ICD-10-CM | POA: Diagnosis not present

## 2022-05-16 DIAGNOSIS — E669 Obesity, unspecified: Secondary | ICD-10-CM | POA: Diagnosis present

## 2022-05-16 DIAGNOSIS — Z79899 Other long term (current) drug therapy: Secondary | ICD-10-CM | POA: Diagnosis not present

## 2022-05-16 DIAGNOSIS — Z87891 Personal history of nicotine dependence: Secondary | ICD-10-CM | POA: Diagnosis not present

## 2022-05-16 DIAGNOSIS — Z6830 Body mass index (BMI) 30.0-30.9, adult: Secondary | ICD-10-CM | POA: Diagnosis not present

## 2022-05-16 DIAGNOSIS — E1169 Type 2 diabetes mellitus with other specified complication: Secondary | ICD-10-CM

## 2022-05-16 DIAGNOSIS — K5731 Diverticulosis of large intestine without perforation or abscess with bleeding: Secondary | ICD-10-CM | POA: Diagnosis present

## 2022-05-16 LAB — CBC
HCT: 25.2 % — ABNORMAL LOW (ref 36.0–46.0)
Hemoglobin: 8.1 g/dL — ABNORMAL LOW (ref 12.0–15.0)
MCH: 28.6 pg (ref 26.0–34.0)
MCHC: 32.1 g/dL (ref 30.0–36.0)
MCV: 89 fL (ref 80.0–100.0)
Platelets: 262 10*3/uL (ref 150–400)
RBC: 2.83 MIL/uL — ABNORMAL LOW (ref 3.87–5.11)
RDW: 14 % (ref 11.5–15.5)
WBC: 9.9 10*3/uL (ref 4.0–10.5)
nRBC: 0 % (ref 0.0–0.2)

## 2022-05-16 LAB — CBG MONITORING, ED
Glucose-Capillary: 123 mg/dL — ABNORMAL HIGH (ref 70–99)
Glucose-Capillary: 100 mg/dL — ABNORMAL HIGH (ref 70–99)
Glucose-Capillary: 142 mg/dL — ABNORMAL HIGH (ref 70–99)
Glucose-Capillary: 113 mg/dL — ABNORMAL HIGH (ref 70–99)

## 2022-05-16 LAB — COMPREHENSIVE METABOLIC PANEL
ALT: 10 U/L (ref 0–44)
AST: 16 U/L (ref 15–41)
Albumin: 2.9 g/dL — ABNORMAL LOW (ref 3.5–5.0)
Alkaline Phosphatase: 54 U/L (ref 38–126)
Anion gap: 7 (ref 5–15)
BUN: 16 mg/dL (ref 8–23)
CO2: 23 mmol/L (ref 22–32)
Calcium: 8.5 mg/dL — ABNORMAL LOW (ref 8.9–10.3)
Chloride: 106 mmol/L (ref 98–111)
Creatinine, Ser: 0.76 mg/dL (ref 0.44–1.00)
GFR, Estimated: >60 mL/min (ref >60)
Glucose, Bld: 115 mg/dL — ABNORMAL HIGH (ref 70–99)
Potassium: 4 mmol/L (ref 3.5–5.1)
Sodium: 136 mmol/L (ref 135–145)
Total Bilirubin: 0.5 mg/dL (ref 0.3–1.2)
Total Protein: 6.7 g/dL (ref 6.5–8.1)

## 2022-05-16 LAB — HEMOGLOBIN AND HEMATOCRIT, BLOOD
HCT: 24.9 % — ABNORMAL LOW (ref 36.0–46.0)
HCT: 24.6 % — ABNORMAL LOW (ref 36.0–46.0)
HCT: 24.2 % — ABNORMAL LOW (ref 36.0–46.0)
HCT: 22.9 % — ABNORMAL LOW (ref 36.0–46.0)
HCT: 25.9 % — ABNORMAL LOW (ref 36.0–46.0)
Hemoglobin: 8 g/dL — ABNORMAL LOW (ref 12.0–15.0)
Hemoglobin: 7.9 g/dL — ABNORMAL LOW (ref 12.0–15.0)
Hemoglobin: 7.8 g/dL — ABNORMAL LOW (ref 12.0–15.0)
Hemoglobin: 7.4 g/dL — ABNORMAL LOW (ref 12.0–15.0)
Hemoglobin: 8.5 g/dL — ABNORMAL LOW (ref 12.0–15.0)

## 2022-05-16 LAB — ABO/RH: ABO/RH(D): O POS

## 2022-05-16 MED ORDER — PEG 3350-KCL-NA BICARB-NACL 420 G PO SOLR
4000.0000 mL | Freq: Once | ORAL | Status: AC
  Administered 2022-05-16: 4000 mL via ORAL
  Filled 2022-05-16: qty 4000

## 2022-05-16 NOTE — Consult Note (Signed)
Referring Provider: TH Primary Care Physician:  [name], MD Primary Gastroenterologist: Gentry Fitz  Reason for Consultation: Rectal bleeding  HPI: Jacqueline Rich is a 82 y.o. female with past medical history of hypertension, diabetes, aortic stenosis presented to the emergency department with rectal bleeding of 1 days duration.  Bright red blood per rectum.  Multiple episodes since yesterday.  Denies any associated abdominal pain, nausea or vomiting.  Initial evaluation yesterday showed drop in hemoglobin to 9.6 with previous normal hemoglobin at 12.6, 2 years ago.  Normal CMP.  Normal INR.  Hemoglobin dropped to 7.9 this morning.  Questionable history of colon cancer in patient's mother.  Last colonoscopy around 5 years ago in [place] was normal.  Report not available to review.    Past Medical History:  Diagnosis Date   Aortic stenosis    Diabetes mellitus without complication (HCC)    Hyperlipidemia    Hypertension    TIA (transient ischemic attack)     Past Surgical History:  Procedure Laterality Date   CATARACT EXTRACTION Bilateral 11/27/2020    Prior to Admission medications   Medication Sig Start Date End Date Taking? Authorizing Provider  acetaminophen (TYLENOL 8 HOUR ARTHRITIS PAIN) 650 MG CR tablet Take 650-1,300 mg by mouth every 8 (eight) hours as needed for pain.   Yes [provider]  atorvastatin (LIPITOR) 10 MG tablet Take 10 mg by mouth daily. 02/01/20  Yes [provider]  DILT-XR 240 MG 24 hr capsule Take 240 mg by mouth daily. 02/01/20  Yes [provider]  glimepiride (AMARYL) 1 MG tablet Take 1 mg by mouth daily. 02/01/20  Yes [provider]  lansoprazole (PREVACID SOLUTAB) 15 MG disintegrating tablet Take 15-30 mg by mouth 2 (two) times daily as needed (for reflux symptoms or indigestion- dissolve orally).   Yes [provider]  losartan (COZAAR) 50 MG tablet Take 50 mg by mouth daily.   Yes  [provider]  metoprolol succinate (TOPROL-XL) 25 MG 24 hr tablet TAKE 1 TABLET(25 MG) BY MOUTH DAILY Patient taking differently: Take 25 mg by mouth daily. 10/15/21  Yes Tolia, Sunit, DO  potassium chloride SA (KLOR-CON) 20 MEQ tablet Take 20 mEq by mouth daily. 02/02/20  Yes [provider]  UNKNOWN TO PATIENT Place 1-2 drops into both eyes See admin instructions. Unnamed OTC eye drop for dryness- Instill 1-2 drops into both eyes up to 3 times a day as needed for dryness   Yes [provider]  Lancets (ONETOUCH DELICA PLUS [id number]) MISC Apply 1 each topically daily. 01/25/20   [provider]  losartan (COZAAR) 25 MG tablet TAKE 1 TABLET(25 MG) BY MOUTH IN THE MORNING Patient not taking: Reported on 05/15/2022 10/19/21   Tolia, Sunit, DO  ONETOUCH ULTRA test strip 1 each daily. 01/25/20   [provider]    Scheduled Meds:  atorvastatin  10 mg Oral Daily   diltiazem  240 mg Oral Daily   insulin aspart  0-15 Units Subcutaneous TID WC   losartan  50 mg Oral Daily   metoprolol succinate  25 mg Oral Daily   pantoprazole (PROTONIX) IV  40 mg Intravenous Q24H   Continuous Infusions: PRN Meds:.acetaminophen **OR** acetaminophen, ondansetron **OR** ondansetron (ZOFRAN) IV  Allergies as of 05/15/2022 - Review Complete 05/15/2022  Allergen Reaction Noted   Latex Other (See Comments) 05/15/2022   Nsaids Other (See Comments) 05/15/2022    Family History  Problem Relation Age of Onset   Cancer - Colon  Mother    Hypertension Father    Cancer - Lung Brother    Hypertension Sister    Cancer Brother    Heart attack Brother    Hypertension Brother     Social History   Socioeconomic History   Marital status: Single    Spouse name: Not on file   Number of children: 0   Years of education: Not on file   Highest education level: Not on file  Occupational History   Not on file  Tobacco Use   Smoking status: Former    Packs/day: 1.00    Years:  7.00    Total pack years: 7.00    Types: Cigarettes    Quit date: 1990    Years since quitting: 34.0   Smokeless tobacco: Never  Vaping Use   Vaping Use: Never used  Substance and Sexual Activity   Alcohol use: Yes    Alcohol/week: 2.0 standard drinks of alcohol    Types: 1 Glasses of wine, 1 Cans of beer per week    Comment: occasionally   Drug use: Never   Sexual activity: Not on file  Other Topics Concern   Not on file  Social History Narrative   Not on file   Social Determinants of Health   Financial Resource Strain: Not on file  Food Insecurity: Not on file  Transportation Needs: Not on file  Physical Activity: Not on file  Stress: Not on file  Social Connections: Not on file  Intimate Partner Violence: Not on file    Review of Systems: All negative except as stated above in HPI.  Physical Exam: Vital signs: Vitals:   05/16/22 0700 05/16/22 0800  BP: 108/70 109/69  Pulse: 77 79  Resp: 15 18  Temp:    SpO2: 97% 98%     General:   Alert,  Well-developed, well-nourished, pleasant and cooperative in NAD Lungs:  Clear throughout to auscultation.   No wheezes, crackles, or rhonchi. No acute distress. Heart:  Regular rate and rhythm; no murmurs, clicks, rubs,  or gallops. Abdomen: Soft, nontender, nondistended, bowel sounds present, no peritoneal signs Lower extremity -no edema Alert and oriented x 3 Mood and affect normal Rectal:  Deferred  GI:  Lab Results: Recent Labs    05/15/22 1325 05/15/22 1354 05/16/22 0301 05/16/22 0519 05/16/22 0757  WBC 9.9  --   --  9.9  --   HGB 9.6*   < > 8.0* 8.1* 7.9*  HCT 29.1*   < > 24.9* 25.2* 24.6*  PLT 306  --   --  262  --    < > = values in this interval not displayed.   BMET Recent Labs    05/15/22 1325 05/15/22 1354 05/16/22 0519  NA 135 138 136  K 4.2 4.4 4.0  CL 105 104 106  CO2 24  --  23  GLUCOSE 171* 161* 115*  BUN 18 17 16   CREATININE 0.89 0.80 0.76  CALCIUM 8.7*  --  8.5*   LFT Recent  Labs    05/16/22 0519  PROT 6.7  ALBUMIN 2.9*  AST 16  ALT 10  ALKPHOS 54  BILITOT 0.5   PT/INR Recent Labs    05/15/22 1325  LABPROT 13.9  INR 1.1     Studies/Results: No results found.  Impression/Plan: -Painless hematochezia.  Recent NSAID use for stiff neck.  Likely diverticular bleed.  -Acute blood loss anemia -Family history of colon cancer in mother.  Recommendations ------------------------- -Recommend colonoscopy  for further evaluation given family history of colon cancer in mother. -Okay to have clear liquid diet today.  Keep n.p.o. past midnight.  Avoid NSAIDs.  Risks (bleeding, infection, bowel perforation that could require surgery, sedation-related changes in cardiopulmonary systems), benefits (identification and possible treatment of source of symptoms, exclusion of certain causes of symptoms), and alternatives (watchful waiting, radiographic imaging studies, empiric medical treatment)  were explained to patient/family in detail and patient wishes to proceed.     LOS: 0 days   Otis Brace  MD, FACP 05/16/2022, 9:35 AM  Contact #  [phone]

## 2022-05-16 NOTE — Progress Notes (Signed)
Jacqueline Rich CZY:[id number] DOB: July 20, 1940 DOA: 05/15/2022 PCP: Leeroy Cha, MD   Subj: 82 y.o. BF PMHx HLD, HTN, DM2, aortic stenosis.   Presenting with rectal bleeding. She was in her normal state of health until yesterday morning when so noticed bright red blood with her stool. She didn't have any rectal pain or abdominal pain. She didn't have any CP, dyspnea, palpitations, lightheadedness, dizziness. She had several episodes throughout the day and into this morning. She is not on a blood thinner or ASA. When her symptoms did not improve this morning, she decided to come the ED for evaluation. She denies any other aggravating or alleviating factors.       Obj: 1/18 A/O x 4 no complaints.  States rectal bleeding again~2 days ago BRBPR, negative abdominal pain, negative nausea.  States last colonoscopy 5 years ago to the best of her knowledge negative findings.   Objective: VITAL SIGNS: Temp: 97.7 F (36.5 C) (01/18 1154) Temp Source: Oral (01/18 1154) BP: 115/65 (01/18 1300) Pulse Rate: 74 (01/18 1300)   VENTILATOR SETTINGS: **  No intake or output data in the 24 hours ending 05/16/22 1526   Exam: Physical Exam:  General: A/O x 4, No acute respiratory distress Eyes: negative scleral hemorrhage, negative anisocoria, negative icterus ENT: Negative Runny nose, negative gingival bleeding, Neck:  Negative scars, masses, torticollis, lymphadenopathy, JVD Lungs: Clear to auscultation bilaterally without wheezes or crackles Cardiovascular: Regular rate and rhythm without murmur gallop or rub normal S1 and S2 Abdomen: negative abdominal pain, nondistended, positive soft, bowel sounds, no rebound, no ascites, no appreciable mass Extremities: No significant cyanosis, clubbing, or edema bilateral lower extremities Skin: Negative rashes, lesions, ulcers Psychiatric:  Negative depression, negative anxiety, negative fatigue, negative mania  Central nervous system:  Cranial  nerves II through XII intact, tongue/uvula midline, all extremities muscle strength 5/5, sensation intact throughout, negative dysarthria, negative expressive aphasia, negative receptive aphasia.   .     Mobility Assessment (last 72 hours)     Mobility Assessment   No documentation.             DVT prophylaxis:  Code Status: Full Family Communication: 1/18 Niece at bedside for discussion of plan of care all questions answered Status is: Inpatient    Dispo: The patient is from: Home              Anticipated d/c is to: Home              Anticipated d/c date is: 2 days              Patient currently is not medically stable to d/c.    Procedures/Significant Events:    Consultants:  GI   Cultures   Antimicrobials: Anti-infectives (From admission, onward)    None         Assessment & Plan: Covid vaccination;   Principal Problem:   GIB (gastrointestinal bleeding) Active Problems:   Dyslipidemia   Essential hypertension   Gastroesophageal reflux disease   Type 2 diabetes mellitus with other specified complication (HCC)  GI bleed - Dr.Parag [facility] plans colonoscopy in AM.  Essential HTN - Controlled - Diltiazem 240 mg daily - Losartan 50 mg daily - Toprol 25 mg daily -  DM type II with other specified complication --0/93 Hemoglobin A1c= 5.9 - Moderate SSI  Dyslipidemia - Atorvastatin 10 mg daily - 11/18 lipid panel pending   Obesity ( 30.24 kg/m.) - Patient to address with PCP    Mobility  Assessment (last 72 hours)     Mobility Assessment   No documentation.            Time: 50 minutes         Care during the described time interval was provided by me .  I have reviewed this patient's available data, including medical history, events of note, physical examination, and all test results as part of my evaluation.

## 2022-05-16 NOTE — Plan of Care (Signed)

## 2022-05-16 NOTE — Progress Notes (Incomplete)
Jacqueline Rich [id number]:[id number] DOB: 1940/07/14 DOA: 05/15/2022 PCP: Leeroy Cha, MD   Subj: Jacqueline Rich is a 82 y.o. BF PMHx HLD, HTN, DM type II controlled, aortic stenosis.  EtOH use  Presenting with rectal bleeding. She was in her normal state of health until yesterday morning when so noticed bright red blood with her stool. She didn't have any rectal pain or abdominal pain. She didn't have any CP, dyspnea, palpitations, lightheadedness, dizziness. She had several episodes throughout the day and into this morning. She is not on a blood thinner or ASA. When her symptoms did not improve this morning, she decided to come the ED for evaluation. She denies any other aggravating or alleviating factors.       Obj:    Objective: VITAL SIGNS: Temp: 98.6 F (37 C) (01/18 0655) Temp Source: Oral (01/18 0655) BP: 125/68 (01/18 0645) Pulse Rate: 75 (01/18 0645)   VENTILATOR SETTINGS: **  No intake or output data in the 24 hours ending 05/16/22 0749   Exam: Physical Exam:  General: No acute respiratory distress Eyes: negative scleral hemorrhage, negative anisocoria, negative icterus*** ENT: Negative Runny nose, negative gingival bleeding,*** Neck:  Negative scars, masses, torticollis, lymphadenopathy, JVD*** Lungs: Clear to auscultation bilaterally without wheezes or crackles Cardiovascular: Regular rate and rhythm without murmur gallop or rub normal S1 and S2 Abdomen: negative abdominal pain, nondistended, positive soft, bowel sounds, no rebound, no ascites, no appreciable mass Extremities: No significant cyanosis, clubbing, or edema bilateral lower extremities Skin: Negative rashes, lesions, ulcers*** Psychiatric:  Negative depression, negative anxiety, negative fatigue, negative mania *** Central nervous system:  Cranial nerves II through XII intact, tongue/uvula midline, all extremities muscle strength 5/5, sensation intact throughout, finger nose finger  bilateral within normal limits, quick finger touch bilateral within normal limits, negative Romberg sign, heel to shin bilateral within normal limits, standing on 1 foot bilateral within normal limits, walking on tiptoes within normal limits, walking on heels within normal limits, negative dysarthria, negative expressive aphasia, negative receptive aphasia.***   .    *** Mobility Assessment (last 72 hours)     Mobility Assessment   No documentation.             DVT prophylaxis: *** Code Status: *** Family Communication: *** Status is: Inpatient  {Inpatient:23812}  Dispo: The patient is from: {From:23814}              Anticipated d/c is to: {To:23815}              Anticipated d/c date is: {Days:23816}              Patient currently {Medically stable:23817}    Procedures/Significant Events:    Consultants:  ***   Cultures ***  Antimicrobials: ***   Assessment & Plan: Covid vaccination;   Principal Problem:   GIB (gastrointestinal bleeding) Active Problems:   Dyslipidemia   Essential hypertension   Gastroesophageal reflux disease   Type 2 diabetes mellitus with other specified complication (HCC)  GIB     - place in obs, tele     - continue PPI     - check iron studies     - follow q6H H&H     - [facility] consulted, appreciate assistance     - FLD tonight, make NPOpMN -1/18 hemoglobin QID******   DM2     - A1c, SSI, glucose checks, FLD   HTN     - continue home regimen when confirmed   HLD     -  continue home regimen when confirmed   GERD     - PPI           Body mass index is 30.24 kg/m.  *** Mobility Assessment (last 72 hours)     Mobility Assessment   No documentation.            Time: 50 minutes         Care during the described time interval was provided by me .  I have reviewed this patient's available data, including medical history, events of note, physical examination, and all test results as part of my  evaluation.

## 2022-05-16 NOTE — H&P (View-Only) (Signed)
Referring Provider: TH Primary Care Physician:  Varadarajan, Rupashree, MD Primary Gastroenterologist: Unassigned  Reason for Consultation: Rectal bleeding  HPI: Jacqueline Rich is a 82 y.o. female with past medical history of hypertension, diabetes, aortic stenosis presented to the emergency department with rectal bleeding of 1 days duration.  Bright red blood per rectum.  Multiple episodes since yesterday.  Denies any associated abdominal pain, nausea or vomiting.  Initial evaluation yesterday showed drop in hemoglobin to 9.6 with previous normal hemoglobin at 12.6, 2 years ago.  Normal CMP.  Normal INR.  Hemoglobin dropped to 7.9 this morning.  Questionable history of colon cancer in patient's mother.  Last colonoscopy around 5 years ago in [place] was normal.  Report not available to review.    Past Medical History:  Diagnosis Date   Aortic stenosis    Diabetes mellitus without complication (HCC)    Hyperlipidemia    Hypertension    TIA (transient ischemic attack)     Past Surgical History:  Procedure Laterality Date   CATARACT EXTRACTION Bilateral 11/27/2020    Prior to Admission medications   Medication Sig Start Date End Date Taking? Authorizing Provider  acetaminophen (TYLENOL 8 HOUR ARTHRITIS PAIN) 650 MG CR tablet Take 650-1,300 mg by mouth every 8 (eight) hours as needed for pain.   Yes [provider]  atorvastatin (LIPITOR) 10 MG tablet Take 10 mg by mouth daily. 02/01/20  Yes [provider]  DILT-XR 240 MG 24 hr capsule Take 240 mg by mouth daily. 02/01/20  Yes [provider]  glimepiride (AMARYL) 1 MG tablet Take 1 mg by mouth daily. 02/01/20  Yes [provider]  lansoprazole (PREVACID SOLUTAB) 15 MG disintegrating tablet Take 15-30 mg by mouth 2 (two) times daily as needed (for reflux symptoms or indigestion- dissolve orally).   Yes [provider]  losartan (COZAAR) 50 MG tablet Take 50 mg by mouth daily.   Yes  [provider]  metoprolol succinate (TOPROL-XL) 25 MG 24 hr tablet TAKE 1 TABLET(25 MG) BY MOUTH DAILY Patient taking differently: Take 25 mg by mouth daily. 10/15/21  Yes Tolia, Sunit, DO  potassium chloride SA (KLOR-CON) 20 MEQ tablet Take 20 mEq by mouth daily. 02/02/20  Yes [provider]  UNKNOWN TO PATIENT Place 1-2 drops into both eyes See admin instructions. Unnamed OTC eye drop for dryness- Instill 1-2 drops into both eyes up to 3 times a day as needed for dryness   Yes [provider]  Lancets (ONETOUCH DELICA PLUS LANCET33G) MISC Apply 1 each topically daily. 01/25/20   [provider]  losartan (COZAAR) 25 MG tablet TAKE 1 TABLET(25 MG) BY MOUTH IN THE MORNING Patient not taking: Reported on 05/15/2022 10/19/21   Tolia, Sunit, DO  ONETOUCH ULTRA test strip 1 each daily. 01/25/20   [provider]    Scheduled Meds:  atorvastatin  10 mg Oral Daily   diltiazem  240 mg Oral Daily   insulin aspart  0-15 Units Subcutaneous TID WC   losartan  50 mg Oral Daily   metoprolol succinate  25 mg Oral Daily   pantoprazole (PROTONIX) IV  40 mg Intravenous Q24H   Continuous Infusions: PRN Meds:.acetaminophen **OR** acetaminophen, ondansetron **OR** ondansetron (ZOFRAN) IV  Allergies as of 05/15/2022 - Review Complete 05/15/2022  Allergen Reaction Noted   Latex Other (See Comments) 05/15/2022   Nsaids Other (See Comments) 05/15/2022    Family History  Problem Relation Age of Onset   Cancer - Colon   Mother    Hypertension Father    Cancer - Lung Brother    Hypertension Sister    Cancer Brother    Heart attack Brother    Hypertension Brother     Social History   Socioeconomic History   Marital status: Single    Spouse name: Not on file   Number of children: 0   Years of education: Not on file   Highest education level: Not on file  Occupational History   Not on file  Tobacco Use   Smoking status: Former    Packs/day: 1.00    Years:  7.00    Total pack years: 7.00    Types: Cigarettes    Quit date: 1990    Years since quitting: 34.0   Smokeless tobacco: Never  Vaping Use   Vaping Use: Never used  Substance and Sexual Activity   Alcohol use: Yes    Alcohol/week: 2.0 standard drinks of alcohol    Types: 1 Glasses of wine, 1 Cans of beer per week    Comment: occasionally   Drug use: Never   Sexual activity: Not on file  Other Topics Concern   Not on file  Social History Narrative   Not on file   Social Determinants of Health   Financial Resource Strain: Not on file  Food Insecurity: Not on file  Transportation Needs: Not on file  Physical Activity: Not on file  Stress: Not on file  Social Connections: Not on file  Intimate Partner Violence: Not on file    Review of Systems: All negative except as stated above in HPI.  Physical Exam: Vital signs: Vitals:   05/16/22 0700 05/16/22 0800  BP: 108/70 109/69  Pulse: 77 79  Resp: 15 18  Temp:    SpO2: 97% 98%     General:   Alert,  Well-developed, well-nourished, pleasant and cooperative in NAD Lungs:  Clear throughout to auscultation.   No wheezes, crackles, or rhonchi. No acute distress. Heart:  Regular rate and rhythm; no murmurs, clicks, rubs,  or gallops. Abdomen: Soft, nontender, nondistended, bowel sounds present, no peritoneal signs Lower extremity -no edema Alert and oriented x 3 Mood and affect normal Rectal:  Deferred  GI:  Lab Results: Recent Labs    05/15/22 1325 05/15/22 1354 05/16/22 0301 05/16/22 0519 05/16/22 0757  WBC 9.9  --   --  9.9  --   HGB 9.6*   < > 8.0* 8.1* 7.9*  HCT 29.1*   < > 24.9* 25.2* 24.6*  PLT 306  --   --  262  --    < > = values in this interval not displayed.   BMET Recent Labs    05/15/22 1325 05/15/22 1354 05/16/22 0519  NA 135 138 136  K 4.2 4.4 4.0  CL 105 104 106  CO2 24  --  23  GLUCOSE 171* 161* 115*  BUN 18 17 16   CREATININE 0.89 0.80 0.76  CALCIUM 8.7*  --  8.5*   LFT Recent  Labs    05/16/22 0519  PROT 6.7  ALBUMIN 2.9*  AST 16  ALT 10  ALKPHOS 54  BILITOT 0.5   PT/INR Recent Labs    05/15/22 1325  LABPROT 13.9  INR 1.1     Studies/Results: No results found.  Impression/Plan: -Painless hematochezia.  Recent NSAID use for stiff neck.  Likely diverticular bleed.  -Acute blood loss anemia -Family history of colon cancer in mother.  Recommendations ------------------------- -Recommend colonoscopy  for further evaluation given family history of colon cancer in mother. -Okay to have clear liquid diet today.  Keep n.p.o. past midnight.  Avoid NSAIDs.  Risks (bleeding, infection, bowel perforation that could require surgery, sedation-related changes in cardiopulmonary systems), benefits (identification and possible treatment of source of symptoms, exclusion of certain causes of symptoms), and alternatives (watchful waiting, radiographic imaging studies, empiric medical treatment)  were explained to patient/family in detail and patient wishes to proceed.     LOS: 0 days   Otis Brace  MD, FACP 05/16/2022, 9:35 AM  Contact #  [phone]

## 2022-05-17 ENCOUNTER — Inpatient Hospital Stay (HOSPITAL_COMMUNITY): Payer: Medicare Other | Admitting: Anesthesiology

## 2022-05-17 ENCOUNTER — Encounter (HOSPITAL_COMMUNITY): Payer: Self-pay | Admitting: Internal Medicine

## 2022-05-17 ENCOUNTER — Encounter (HOSPITAL_COMMUNITY)
Admission: EM | Disposition: A | Discharge status: Home / Self Care | Payer: Self-pay | Source: Home / Self Care | Attending: Internal Medicine

## 2022-05-17 DIAGNOSIS — Z87891 Personal history of nicotine dependence: Secondary | ICD-10-CM

## 2022-05-17 DIAGNOSIS — K922 Gastrointestinal hemorrhage, unspecified: Secondary | ICD-10-CM | POA: Diagnosis not present

## 2022-05-17 DIAGNOSIS — I10 Essential (primary) hypertension: Secondary | ICD-10-CM | POA: Diagnosis not present

## 2022-05-17 DIAGNOSIS — K5731 Diverticulosis of large intestine without perforation or abscess with bleeding: Secondary | ICD-10-CM

## 2022-05-17 DIAGNOSIS — K644 Residual hemorrhoidal skin tags: Secondary | ICD-10-CM

## 2022-05-17 DIAGNOSIS — Z980 Intestinal bypass and anastomosis status: Secondary | ICD-10-CM

## 2022-05-17 DIAGNOSIS — K625 Hemorrhage of anus and rectum: Secondary | ICD-10-CM | POA: Diagnosis not present

## 2022-05-17 DIAGNOSIS — E785 Hyperlipidemia, unspecified: Secondary | ICD-10-CM | POA: Diagnosis not present

## 2022-05-17 DIAGNOSIS — K648 Other hemorrhoids: Secondary | ICD-10-CM

## 2022-05-17 HISTORY — PX: COLONOSCOPY WITH PROPOFOL: SHX5780

## 2022-05-17 LAB — CBC WITH DIFFERENTIAL/PLATELET
Abs Immature Granulocytes: 0.04 10*3/uL (ref 0.00–0.07)
Basophils Absolute: 0.1 10*3/uL (ref 0.0–0.1)
Basophils Relative: 1 %
Eosinophils Absolute: 0.4 10*3/uL (ref 0.0–0.5)
Eosinophils Relative: 4 %
HCT: 24.6 % — ABNORMAL LOW (ref 36.0–46.0)
Hemoglobin: 8 g/dL — ABNORMAL LOW (ref 12.0–15.0)
Immature Granulocytes: 0 %
Lymphocytes Relative: 25 %
Lymphs Abs: 2.7 10*3/uL (ref 0.7–4.0)
MCH: 29.2 pg (ref 26.0–34.0)
MCHC: 32.5 g/dL (ref 30.0–36.0)
MCV: 89.8 fL (ref 80.0–100.0)
Monocytes Absolute: 0.7 10*3/uL (ref 0.1–1.0)
Monocytes Relative: 6 %
Neutro Abs: 6.7 10*3/uL (ref 1.7–7.7)
Neutrophils Relative %: 64 %
Platelets: 279 10*3/uL (ref 150–400)
RBC: 2.74 MIL/uL — ABNORMAL LOW (ref 3.87–5.11)
RDW: 14.1 % (ref 11.5–15.5)
WBC: 10.6 10*3/uL — ABNORMAL HIGH (ref 4.0–10.5)
nRBC: 0 % (ref 0.0–0.2)

## 2022-05-17 LAB — GLUCOSE, CAPILLARY
Glucose-Capillary: 101 mg/dL — ABNORMAL HIGH (ref 70–99)
Glucose-Capillary: 109 mg/dL — ABNORMAL HIGH (ref 70–99)
Glucose-Capillary: 110 mg/dL — ABNORMAL HIGH (ref 70–99)
Glucose-Capillary: 113 mg/dL — ABNORMAL HIGH (ref 70–99)
Glucose-Capillary: 115 mg/dL — ABNORMAL HIGH (ref 70–99)
Glucose-Capillary: 135 mg/dL — ABNORMAL HIGH (ref 70–99)

## 2022-05-17 LAB — MAGNESIUM: Magnesium: 1.9 mg/dL (ref 1.7–2.4)

## 2022-05-17 LAB — LIPID PANEL
Cholesterol: 132 mg/dL (ref 0–200)
HDL: 35 mg/dL — ABNORMAL LOW (ref >40)
LDL Cholesterol: 71 mg/dL (ref 0–99)
Total CHOL/HDL Ratio: 3.8 RATIO
Triglycerides: 129 mg/dL (ref <150)
VLDL: 26 mg/dL (ref 0–40)

## 2022-05-17 LAB — COMPREHENSIVE METABOLIC PANEL
ALT: 12 U/L (ref 0–44)
AST: 17 U/L (ref 15–41)
Albumin: 3.5 g/dL (ref 3.5–5.0)
Alkaline Phosphatase: 60 U/L (ref 38–126)
Anion gap: 11 (ref 5–15)
BUN: 14 mg/dL (ref 8–23)
CO2: 22 mmol/L (ref 22–32)
Calcium: 8.8 mg/dL — ABNORMAL LOW (ref 8.9–10.3)
Chloride: 108 mmol/L (ref 98–111)
Creatinine, Ser: 0.67 mg/dL (ref 0.44–1.00)
GFR, Estimated: >60 mL/min (ref >60)
Glucose, Bld: 114 mg/dL — ABNORMAL HIGH (ref 70–99)
Potassium: 3.8 mmol/L (ref 3.5–5.1)
Sodium: 141 mmol/L (ref 135–145)
Total Bilirubin: 0.4 mg/dL (ref 0.3–1.2)
Total Protein: 7.3 g/dL (ref 6.5–8.1)

## 2022-05-17 LAB — HEMOGLOBIN AND HEMATOCRIT, BLOOD
HCT: 24.9 % — ABNORMAL LOW (ref 36.0–46.0)
HCT: 24.8 % — ABNORMAL LOW (ref 36.0–46.0)
HCT: 26.1 % — ABNORMAL LOW (ref 36.0–46.0)
HCT: 26.3 % — ABNORMAL LOW (ref 36.0–46.0)
Hemoglobin: 8.1 g/dL — ABNORMAL LOW (ref 12.0–15.0)
Hemoglobin: 7.9 g/dL — ABNORMAL LOW (ref 12.0–15.0)
Hemoglobin: 8.3 g/dL — ABNORMAL LOW (ref 12.0–15.0)
Hemoglobin: 8.4 g/dL — ABNORMAL LOW (ref 12.0–15.0)

## 2022-05-17 LAB — PHOSPHORUS: Phosphorus: 3.5 mg/dL (ref 2.5–4.6)

## 2022-05-17 SURGERY — COLONOSCOPY WITH PROPOFOL
Anesthesia: Monitor Anesthesia Care

## 2022-05-17 MED ORDER — LIDOCAINE 2% (20 MG/ML) 5 ML SYRINGE
INTRAMUSCULAR | Status: DC | PRN
  Administered 2022-05-17: 60 mg via INTRAVENOUS

## 2022-05-17 MED ORDER — LACTATED RINGERS IV SOLN
INTRAVENOUS | Status: AC | PRN
  Administered 2022-05-17: 1000 mL via INTRAVENOUS

## 2022-05-17 MED ORDER — PROPOFOL 500 MG/50ML IV EMUL
INTRAVENOUS | Status: DC | PRN
  Administered 2022-05-17: 100 ug/kg/min via INTRAVENOUS

## 2022-05-17 MED ORDER — PROPOFOL 10 MG/ML IV BOLUS
INTRAVENOUS | Status: DC | PRN
  Administered 2022-05-17: 30 mg via INTRAVENOUS

## 2022-05-17 MED ORDER — PHENYLEPHRINE 80 MCG/ML (10ML) SYRINGE FOR IV PUSH (FOR BLOOD PRESSURE SUPPORT)
PREFILLED_SYRINGE | INTRAVENOUS | Status: DC | PRN
  Administered 2022-05-17: 240 ug via INTRAVENOUS

## 2022-05-17 MED ORDER — SODIUM CHLORIDE 0.9 % IV SOLN: INTRAVENOUS | Status: DC

## 2022-05-17 SURGICAL SUPPLY — 22 items

## 2022-05-17 NOTE — Transfer of Care (Signed)
Immediate Anesthesia Transfer of Care Note  Patient: Jacqueline Rich  Procedure(s) Performed: COLONOSCOPY WITH PROPOFOL  Patient Location: Endoscopy Unit  Anesthesia Type:MAC  Level of Consciousness: awake, oriented, and patient cooperative  Airway & Oxygen Therapy: Patient Spontanous Breathing and Patient connected to nasal cannula oxygen  Post-op Assessment: Report given to RN and Post -op Vital signs reviewed and stable  Post vital signs: Reviewed  Last Vitals:  Vitals Value Taken Time  BP 118/58 05/17/22 1245  Temp    Pulse 87 05/17/22 1246  Resp 20 05/17/22 1246  SpO2 99 % 05/17/22 1246  Vitals shown include unvalidated device data.  Last Pain:  Vitals:   05/17/22 1145  TempSrc: Tympanic  PainSc: 0-No pain         Complications: No notable events documented.

## 2022-05-17 NOTE — Anesthesia Procedure Notes (Signed)
Procedure Name: MAC Date/Time: 05/17/2022 12:20 PM  Performed by: Jenne Campus, CRNAPre-anesthesia Checklist: Patient identified, Emergency Drugs available, Suction available and Patient being monitored Oxygen Delivery Method: Simple face mask Placement Confirmation: positive ETCO2

## 2022-05-17 NOTE — Progress Notes (Signed)
Mobility Specialist - Progress Note   05/17/22 1108  Mobility  Activity Ambulated independently in hallway  Level of Assistance Independent  Assistive Device None  Distance Ambulated (ft) 500 ft  Activity Response Tolerated well  Mobility Referral Yes  $Mobility charge 1 Mobility   Pt received in bed and agreeable to mobility. No complaints during session. Pt to bed after session with all needs met.    [facility]

## 2022-05-17 NOTE — Interval H&P Note (Signed)
History and Physical Interval Note:  05/17/2022 12:07 PM  Jacqueline Rich  has presented today for surgery, with the diagnosis of Lower GI bleed.  The various methods of treatment have been discussed with the patient and family. After consideration of risks, benefits and other options for treatment, the patient has consented to  Procedure(s): COLONOSCOPY WITH PROPOFOL (N/A) as a surgical intervention.  The patient's history has been reviewed, patient examined, no change in status, stable for surgery.  I have reviewed the patient's chart and labs.  Questions were answered to the patient's satisfaction.     Breeze Angell

## 2022-05-17 NOTE — Plan of Care (Signed)

## 2022-05-17 NOTE — Anesthesia Postprocedure Evaluation (Signed)
Anesthesia Post Note  Patient: Jacqueline Rich  Procedure(s) Performed: COLONOSCOPY WITH PROPOFOL     Patient location during evaluation: Endoscopy Anesthesia Type: MAC Level of consciousness: awake and alert Pain management: pain level controlled Vital Signs Assessment: post-procedure vital signs reviewed and stable Respiratory status: spontaneous breathing, nonlabored ventilation and respiratory function stable Cardiovascular status: stable and blood pressure returned to baseline Postop Assessment: no apparent nausea or vomiting Anesthetic complications: no  No notable events documented.  Last Vitals:  Vitals:   05/17/22 1300 05/17/22 1315  BP: 127/62 113/80  Pulse: 83 85  Resp: (!) 21 18  Temp:  36.7 C  SpO2: 92% 96%    Last Pain:  Vitals:   05/17/22 1315  TempSrc: Oral  PainSc:                  Jacqualynn Parco,W. EDMOND

## 2022-05-17 NOTE — Op Note (Addendum)
[facility] Patient Name: Jacqueline Rich Procedure Date: 05/17/2022 MRN: [id number] Attending MD: Otis Brace , MD, [id number] Date of Birth: May 12, 1940 CSN: [id number] Age: 82 Admit Type: Inpatient Procedure:                Colonoscopy Indications:              Rectal bleeding Providers:                Otis Brace, MD, Fanny Skates RN, RN, Cletis Athens, Technician Referring MD:              Medicines:                Sedation Administered by an Anesthesia Professional Complications:            No immediate complications. Estimated Blood Loss:     Estimated blood loss was minimal. Procedure:                Pre-Anesthesia Assessment:                           - Prior to the procedure, a History and Physical                            was performed, and patient medications and                            allergies were reviewed. The patient's tolerance of                            previous anesthesia was also reviewed. The risks                            and benefits of the procedure and the sedation                            options and risks were discussed with the patient.                            All questions were answered, and informed consent                            was obtained. Prior Anticoagulants: The patient has                            taken no anticoagulant or antiplatelet agents. ASA                            Grade Assessment: IV - A patient with severe                            systemic disease that is a constant threat to life.  After reviewing the risks and benefits, the patient                            was deemed in satisfactory condition to undergo the                            procedure.                           After obtaining informed consent, the colonoscope                            was passed under direct vision. Throughout the                            procedure,  the patient's blood pressure, pulse, and                            oxygen saturations were monitored continuously. The                            PCF-HQ190L ([id number]) Olympus colonoscope was                            introduced through the anus and advanced to the the                            ileocolonic anastomosis. The colonoscopy was                            performed without difficulty. The patient tolerated                            the procedure well. The quality of the bowel                            preparation was fair. The rectum and anstomosis                            were photographed. Scope In: 12:24:25 PM Scope Out: 12:35:46 PM Scope Withdrawal Time: 0 hours 8 minutes 1 second  Total Procedure Duration: 0 hours 11 minutes 21 seconds  Findings:      Skin tags were found on perianal exam.      There was evidence of a prior end-to-side ileo-colonic anastomosis in       the transverse colon. This was characterized by healthy appearing mucosa.      Scattered diverticula were found in the entire colon.      Internal hemorrhoids were found during retroflexion. The hemorrhoids       were large. Impression:               - Preparation of the colon was fair.                           - Perianal skin tags found on perianal exam.                           -  End-to-side ileo-colonic anastomosis,                            characterized by healthy appearing mucosa.                           - Diverticulosis in the entire examined colon.                           - Internal hemorrhoids.                           - No specimens collected. Moderate Sedation:      Moderate (conscious) sedation was personally administered by an       anesthesia professional. The following parameters were monitored: oxygen       saturation, heart rate, blood pressure, and response to care. Recommendation:           - Return patient to hospital ward for ongoing care.                           -  Soft diet.                           - Continue present medications. Procedure Code(s):        --- Professional ---                           [id number], Colonoscopy, flexible; diagnostic, including                            collection of specimen(s) by brushing or washing,                            when performed (separate procedure) Diagnosis Code(s):        --- Professional ---                           K64.8, Other hemorrhoids                           Z98.0, Intestinal bypass and anastomosis status                           K64.4, Residual hemorrhoidal skin tags                           K62.5, Hemorrhage of anus and rectum                           K57.30, Diverticulosis of large intestine without                            perforation or abscess without bleeding CPT copyright 2022 American Medical Association. All rights reserved. The codes documented in this report are preliminary and upon coder review may  be revised to meet current compliance requirements. Kathi Der, MD Kathi Der, MD 05/17/2022 12:41:57 PM Number of  Addenda: 0

## 2022-05-17 NOTE — Progress Notes (Signed)
  Transition of Care (TOC) Screening Note   Patient Details  Name: Jacqueline Rich Date of Birth: 1941/04/16   Transition of Care [facility]) CM/SW Contact:    Vassie Moselle, LCSW Phone Number: 05/17/2022, 2:02 PM    Transition of Care Department [facility]) has reviewed patient and no TOC needs have been identified at this time. We will continue to monitor patient advancement through interdisciplinary progression rounds. If new patient transition needs arise, please place a TOC consult.

## 2022-05-17 NOTE — Brief Op Note (Signed)
05/15/2022 - 05/17/2022  12:42 PM  PATIENT:  Grace Bushy Mallis  82 y.o. female  PRE-OPERATIVE DIAGNOSIS:  Lower GI bleed  POST-OPERATIVE DIAGNOSIS:  diverticulosis,  PROCEDURE:  Procedure(s): COLONOSCOPY WITH PROPOFOL (N/A)  SURGEON:  Surgeon(s) and Role:    * Taylee Gunnells, MD - Primary  Findings ----------- -Colonoscopy showed evidence of ileocolonic anastomosis in the transverse colon, diverticulosis and large internal hemorrhoids.  No evidence of active bleeding.  Recommendations -------------------------- -Recommend Anusol suppository twice a day for 2 weeks -start soft diet and advance as tolerated -No further inpatient GI workup planned.  GI will sign off.  Call us back if needed.  Otis Brace MD, [place] 05/17/2022, 12:45 PM  Contact #  [phone]

## 2022-05-17 NOTE — Progress Notes (Signed)
Jacqueline Rich:[id number] DOB: [birth year] DOA: 05/15/2022 PCP: Leeroy Cha, MD   Subj: 82 y.o. BF PMHx HLD, HTN, DM2, aortic stenosis.   Presenting with rectal bleeding. She was in her normal state of health until yesterday morning when so noticed bright red blood with her stool. She didn't have any rectal pain or abdominal pain. She didn't have any CP, dyspnea, palpitations, lightheadedness, dizziness. She had several episodes throughout the day and into this morning. She is not on a blood thinner or ASA. When her symptoms did not improve this morning, she decided to come the ED for evaluation. She denies any other aggravating or alleviating factors.       Obj: 1/19 A/O x 4, negative abdominal pain, negative nausea, negative vomiting.  S/p colonoscopy   Objective: VITAL SIGNS: Temp: 97.5 F (36.4 C) (01/19 1145) Temp Source: Tympanic (01/19 1145) BP: (P) 118/58 (01/19 1245) Pulse Rate: 87 (01/19 1246)   VENTILATOR SETTINGS: **   Intake/Output Summary (Last 24 hours) at 05/17/2022 1249 Last data filed at 05/16/2022 2030 Gross per 24 hour  Intake 240 ml  Output --  Net 240 ml    Physical Exam:  General: A/O x 4 No acute respiratory distress Eyes: negative scleral hemorrhage, negative anisocoria, negative icterus ENT: Negative Runny nose, negative gingival bleeding, Neck:  Negative scars, masses, torticollis, lymphadenopathy, JVD Lungs: Clear to auscultation bilaterally without wheezes or crackles Cardiovascular: Regular rate and rhythm without murmur gallop or rub normal S1 and S2 Abdomen: negative abdominal pain, nondistended, positive soft, bowel sounds, no rebound, no ascites, no appreciable mass Extremities: No significant cyanosis, clubbing, or edema bilateral lower extremities Skin: Negative rashes, lesions, ulcers Psychiatric:  Negative depression, negative anxiety, negative fatigue, negative mania  Central nervous system:  Cranial nerves II through  XII intact, tongue/uvula midline, all extremities muscle strength 5/5, sensation intact throughout, negative dysarthria, negative expressive aphasia, negative receptive aphasia.   .     Mobility Assessment (last 72 hours)     Mobility Assessment     Row Name 05/16/22 2030           Does patient have an order for bedrest or is patient medically unstable No - Continue assessment       What is the highest level of mobility based on the progressive mobility assessment? Level 5 (Walks with assist in room/hall) - Balance while stepping forward/back and can walk in room with assist - Complete                  DVT prophylaxis:  Code Status: Full Family Communication: 1/19 Niece at bedside for discussion of plan of care all questions answered Status is: Inpatient    Dispo: The patient is from: Home              Anticipated d/c is to: Home              Anticipated d/c date is: 2 days              Patient currently is not medically stable to d/c.    Procedures/Significant Events: 1/19 s/p Colonoscopy -Colonoscopy showed evidence of ileocolonic anastomosis in the transverse colon, diverticulosis and large internal hemorrhoids. No evidence of active bleeding.  Recommendations -------------------------- -Recommend Anusol suppository twice a day for 2 weeks -start soft diet and advance as tolerated -No further inpatient GI workup planned. GI will sign off. Call us back if needed.    Consultants:  GI   Cultures  Antimicrobials: Anti-infectives (From admission, onward)    None         Assessment & Plan: Covid vaccination;   Principal Problem:   GIB (gastrointestinal bleeding) Active Problems:   Dyslipidemia   Essential hypertension   Gastroesophageal reflux disease   Type 2 diabetes mellitus with other specified complication ([place])  GI bleed - 1/19 Dr.Parag Brahmbhatt GI performed colonoscopy.  See results above -1/19 consult patient if she can consume  food and fluids overnight without bleeding will discharge in a.m.  Essential HTN - Controlled - Diltiazem 240 mg daily - Losartan 50 mg daily - Toprol 25 mg daily -  DM type II with other specified complication --2/75 Hemoglobin A1c= 5.9 - Moderate SSI  Dyslipidemia - Atorvastatin 10 mg daily - 11/18 lipid panel pending   Obesity ( 30.24 kg/m.) - Patient to address with PCP    Mobility Assessment (last 72 hours)     Mobility Assessment     Row Name 05/16/22 2030           Does patient have an order for bedrest or is patient medically unstable No - Continue assessment       What is the highest level of mobility based on the progressive mobility assessment? Level 5 (Walks with assist in room/hall) - Balance while stepping forward/back and can walk in room with assist - Complete                 Time: 50 minutes         Care during the described time interval was provided by me .  I have reviewed this patient's available data, including medical history, events of note, physical examination, and all test results as part of my evaluation.

## 2022-05-17 NOTE — Anesthesia Preprocedure Evaluation (Addendum)
Anesthesia Evaluation  Patient identified by MRN, date of birth, ID band Patient awake    Reviewed: Allergy & Precautions, H&P , NPO status , Patient's Chart, lab work & pertinent test results  Airway Mallampati: II  TM Distance: >3 FB Neck ROM: Full    Dental no notable dental hx. (+) Dental Advisory Given, Upper Dentures   Pulmonary former smoker   Pulmonary exam normal breath sounds clear to auscultation       Cardiovascular hypertension, Pt. on medications and Pt. on home beta blockers + Valvular Problems/Murmurs AS  Rhythm:Regular Rate:Normal + Systolic murmurs    Neuro/Psych TIA negative psych ROS   GI/Hepatic Neg liver ROS,GERD  Medicated,,  Endo/Other  diabetes    Renal/GU negative Renal ROS  negative genitourinary   Musculoskeletal   Abdominal   Peds  Hematology negative hematology ROS (+)   Anesthesia Other Findings   Reproductive/Obstetrics negative OB ROS                              Anesthesia Physical Anesthesia Plan  ASA: 4  Anesthesia Plan: MAC   Post-op Pain Management: Minimal or no pain anticipated   Induction: Intravenous  PONV Risk Score and Plan: 2 and Propofol infusion and Treatment may vary due to age or medical condition  Airway Management Planned: Simple Face Mask and Natural Airway  Additional Equipment:   Intra-op Plan:   Post-operative Plan:   Informed Consent: I have reviewed the patients History and Physical, chart, labs and discussed the procedure including the risks, benefits and alternatives for the proposed anesthesia with the patient or authorized representative who has indicated his/her understanding and acceptance.     Dental advisory given  Plan Discussed with: CRNA and Surgeon  Anesthesia Plan Comments:         Anesthesia Quick Evaluation

## 2022-05-18 DIAGNOSIS — K219 Gastro-esophageal reflux disease without esophagitis: Secondary | ICD-10-CM

## 2022-05-18 DIAGNOSIS — I10 Essential (primary) hypertension: Secondary | ICD-10-CM | POA: Diagnosis not present

## 2022-05-18 DIAGNOSIS — K5791 Diverticulosis of intestine, part unspecified, without perforation or abscess with bleeding: Secondary | ICD-10-CM

## 2022-05-18 DIAGNOSIS — E785 Hyperlipidemia, unspecified: Secondary | ICD-10-CM | POA: Diagnosis not present

## 2022-05-18 DIAGNOSIS — K625 Hemorrhage of anus and rectum: Secondary | ICD-10-CM | POA: Diagnosis not present

## 2022-05-18 LAB — MAGNESIUM: Magnesium: 1.7 mg/dL (ref 1.7–2.4)

## 2022-05-18 LAB — COMPREHENSIVE METABOLIC PANEL
ALT: 13 U/L (ref 0–44)
AST: 19 U/L (ref 15–41)
Albumin: 3.2 g/dL — ABNORMAL LOW (ref 3.5–5.0)
Alkaline Phosphatase: 60 U/L (ref 38–126)
Anion gap: 5 (ref 5–15)
BUN: 19 mg/dL (ref 8–23)
CO2: 26 mmol/L (ref 22–32)
Calcium: 8.5 mg/dL — ABNORMAL LOW (ref 8.9–10.3)
Chloride: 106 mmol/L (ref 98–111)
Creatinine, Ser: 0.89 mg/dL (ref 0.44–1.00)
GFR, Estimated: >60 mL/min (ref >60)
Glucose, Bld: 118 mg/dL — ABNORMAL HIGH (ref 70–99)
Potassium: 3.6 mmol/L (ref 3.5–5.1)
Sodium: 137 mmol/L (ref 135–145)
Total Bilirubin: 0.3 mg/dL (ref 0.3–1.2)
Total Protein: 6.9 g/dL (ref 6.5–8.1)

## 2022-05-18 LAB — CBC WITH DIFFERENTIAL/PLATELET
Abs Immature Granulocytes: 0.04 10*3/uL (ref 0.00–0.07)
Basophils Absolute: 0 10*3/uL (ref 0.0–0.1)
Basophils Relative: 0 %
Eosinophils Absolute: 0.3 10*3/uL (ref 0.0–0.5)
Eosinophils Relative: 3 %
HCT: 23 % — ABNORMAL LOW (ref 36.0–46.0)
Hemoglobin: 7.5 g/dL — ABNORMAL LOW (ref 12.0–15.0)
Immature Granulocytes: 0 %
Lymphocytes Relative: 31 %
Lymphs Abs: 2.9 10*3/uL (ref 0.7–4.0)
MCH: 29 pg (ref 26.0–34.0)
MCHC: 32.6 g/dL (ref 30.0–36.0)
MCV: 88.8 fL (ref 80.0–100.0)
Monocytes Absolute: 0.7 10*3/uL (ref 0.1–1.0)
Monocytes Relative: 8 %
Neutro Abs: 5.5 10*3/uL (ref 1.7–7.7)
Neutrophils Relative %: 58 %
Platelets: 254 10*3/uL (ref 150–400)
RBC: 2.59 MIL/uL — ABNORMAL LOW (ref 3.87–5.11)
RDW: 14 % (ref 11.5–15.5)
WBC: 9.5 10*3/uL (ref 4.0–10.5)
nRBC: 0 % (ref 0.0–0.2)

## 2022-05-18 LAB — HEMOGLOBIN AND HEMATOCRIT, BLOOD
HCT: 23.7 % — ABNORMAL LOW (ref 36.0–46.0)
Hemoglobin: 7.6 g/dL — ABNORMAL LOW (ref 12.0–15.0)

## 2022-05-18 LAB — PHOSPHORUS: Phosphorus: 4.1 mg/dL (ref 2.5–4.6)

## 2022-05-18 LAB — GLUCOSE, CAPILLARY
Glucose-Capillary: 119 mg/dL — ABNORMAL HIGH (ref 70–99)
Glucose-Capillary: 103 mg/dL — ABNORMAL HIGH (ref 70–99)
Glucose-Capillary: 117 mg/dL — ABNORMAL HIGH (ref 70–99)

## 2022-05-18 NOTE — Plan of Care (Signed)
  Problem: Activity: Goal: Risk for activity intolerance will decrease Outcome: Progressing   Problem: Elimination: Goal: Will not experience complications related to bowel motility 05/18/2022 1849 by Dorene Sorrow, RN Outcome: Progressing 05/18/2022 0843 by Dorene Sorrow, RN Outcome: Progressing   Problem: Pain Managment: Goal: General experience of comfort will improve Outcome: Progressing   Problem: Safety: Goal: Ability to remain free from injury will improve Outcome: Progressing

## 2022-05-18 NOTE — Progress Notes (Signed)
Mobility Specialist - Progress Note   05/18/22 1428  Mobility  Activity Ambulated independently in hallway  Level of Assistance Independent  Assistive Device None  Distance Ambulated (ft) 500 ft  Activity Response Tolerated well  Mobility Referral Yes  $Mobility charge 1 Mobility   Pt received in room standing and agreeable to mobility. No complaints during session. Pt to room after session with all needs met w/ sister in room.  [facility]

## 2022-05-18 NOTE — Plan of Care (Signed)
  Problem: Nutrition: Goal: Adequate nutrition will be maintained Outcome: Progressing   Problem: Pain Managment: Goal: General experience of comfort will improve Outcome: Progressing   Problem: Safety: Goal: Ability to remain free from injury will improve Outcome: Progressing

## 2022-05-18 NOTE — Discharge Summary (Signed)
Physician Discharge Summary  Jacqueline Rich ZOX:[id number] DOB: 28-Jan-1941 DOA: 05/15/2022  PCP: [name], MD  Admit date: 05/15/2022 Discharge date: 05/18/2022  Time spent: 35 minutes  Recommendations for Outpatient Follow-up:    GI bleed - 1/19 Dr.Parag Brahmbhatt GI performed colonoscopy.  See results above -1/19 consult patient if she can consume food and fluids overnight without bleeding will discharge in a.m. --1/20  stable for discharge   Essential HTN - Controlled - Diltiazem 240 mg daily - Losartan 50 mg daily - Toprol 25 mg daily    DM type II with other specified complication --1/17 Hemoglobin A1c= 5.9 - Moderate SSI   Dyslipidemia - Atorvastatin 10 mg daily - 11/18 lipid panel pending  Obesity ( 30.24 kg/m.) - Patient to address with PCP   Discharge Diagnoses:  Principal Problem:   GIB (gastrointestinal bleeding) Active Problems:   Dyslipidemia   Essential hypertension   Gastroesophageal reflux disease   Type 2 diabetes mellitus with other specified complication [facility])   Discharge Condition: stable   Diet recommendation: carb modified  Filed Weights   05/15/22 1251 05/17/22 1145  Weight: 75 kg 75 kg    History of present illness:  82 y.o. BF PMHx HLD, HTN, DM2, aortic stenosis.    Presenting with rectal bleeding. She was in her normal state of health until yesterday morning when so noticed bright red blood with her stool. She didn't have any rectal pain or abdominal pain. She didn't have any CP, dyspnea, palpitations, lightheadedness, dizziness. She had several episodes throughout the day and into this morning. She is not on a blood thinner or ASA. When her symptoms did not improve this morning, she decided to come the ED for evaluation. She denies any other aggravating or alleviating factors.      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 No evidence of active bleeding.  Recommendations -------------------------- -Recommend Anusol suppository twice a day for 2 weeks -start soft diet and advance as tolerated -No further inpatient GI workup planned. GI will sign off. Call us back if needed.   Consultations: GI    Antibiotics Anti-infectives (From admission, onward)    None         Discharge Exam: Vitals:   05/17/22 2006 05/18/22 0440 05/18/22 0800 05/18/22 1249  BP: 123/70 134/72 117/65 128/75  Pulse: 90 95 83 85  Resp: 18 18 16 18   Temp: 98.1 F (36.7 C) 98.2 F (36.8 C) 98.1 F (36.7 C) 98.2 F (36.8 C)  TempSrc: Oral Oral Oral Oral  SpO2: 98% 98% 100% 97%  Weight:      Height:        General: A/O x 4 No acute respiratory distress Eyes: negative scleral hemorrhage, negative anisocoria, negative icterus ENT: Negative Runny nose, negative gingival bleeding, Neck:  Negative scars, masses, torticollis, lymphadenopathy, JVD Lungs: Clear to auscultation bilaterally without wheezes or crackles Cardiovascular: Regular rate and rhythm without murmur gallop or rub normal S1 and S2 Abdomen: negative abdominal pain, nondistended, positive soft, bowel sounds, no rebound, no ascites, no appreciable mass  Discharge Instructions   Allergies as of 05/18/2022       Reactions   Latex Other (See Comments)   Made the hands sore when worn   Nsaids Other (See Comments)   Internal bleeding (Meloxicam is suspected as the reason for today's ER visit)  Medication List     TAKE these medications    atorvastatin 10 MG tablet Commonly known as: LIPITOR Take 10 mg by mouth daily.   Dilt-XR 240 MG 24 hr capsule Generic drug: diltiazem Take 240 mg by mouth daily.   glimepiride 1 MG tablet Commonly known as: AMARYL Take 1 mg by mouth daily.   lansoprazole 15 MG disintegrating tablet Commonly known as: PREVACID SOLUTAB Take 15-30 mg by  mouth 2 (two) times daily as needed (for reflux symptoms or indigestion- dissolve orally).   losartan 50 MG tablet Commonly known as: COZAAR Take 50 mg by mouth daily. What changed: Another medication with the same name was removed. Continue taking this medication, and follow the directions you see here.   metoprolol succinate 25 MG 24 hr tablet Commonly known as: TOPROL-XL TAKE 1 TABLET(25 MG) BY MOUTH DAILY What changed: See the new instructions.   OneTouch Delica Plus [id number] Misc Apply 1 each topically daily.   OneTouch Ultra test strip Generic drug: glucose blood 1 each daily.   potassium chloride SA 20 MEQ tablet Commonly known as: KLOR-CON M Take 20 mEq by mouth daily.   Tylenol 8 Hour Arthritis Pain 650 MG CR tablet Generic drug: acetaminophen Take 650-1,300 mg by mouth every 8 (eight) hours as needed for pain.   UNKNOWN TO PATIENT Place 1-2 drops into both eyes See admin instructions. Unnamed OTC eye drop for dryness- Instill 1-2 drops into both eyes up to 3 times a day as needed for dryness       Allergies  Allergen Reactions   Latex Other (See Comments)    Made the hands sore when worn   Nsaids Other (See Comments)    Internal bleeding (Meloxicam is suspected as the reason for today's ER visit)      The results of significant diagnostics from this hospitalization (including imaging, microbiology, ancillary and laboratory) are listed below for reference.    Significant Diagnostic Studies: No results found.  Microbiology: No results found for this or any previous visit (from the past 240 hour(s)).   Labs: Basic Metabolic Panel: Recent Labs  Lab 05/15/22 1325 05/15/22 1354 05/16/22 0519 05/17/22 0435 05/18/22 0730  NA 135 138 136 141 137  K 4.2 4.4 4.0 3.8 3.6  CL 105 104 106 108 106  CO2 24  --  23 22 26   GLUCOSE 171* 161* 115* 114* 118*  BUN 18 17 16 14 19   CREATININE 0.89 0.80 0.76 0.67 0.89  CALCIUM 8.7*  --  8.5* 8.8* 8.5*  MG  --    --   --  1.9 1.7  PHOS  --   --   --  3.5 4.1   Liver Function Tests: Recent Labs  Lab 05/15/22 1325 05/16/22 0519 05/17/22 0435 05/18/22 0730  AST 19 16 17 19   ALT 13 10 12 13   ALKPHOS 72 54 60 60  BILITOT 0.3 0.5 0.4 0.3  PROT 7.7 6.7 7.3 6.9  ALBUMIN 3.5 2.9* 3.5 3.2*   No results for input(s): "LIPASE", "AMYLASE" in the last 168 hours. No results for input(s): "AMMONIA" in the last 168 hours. CBC: Recent Labs  Lab 05/15/22 1325 05/15/22 1354 05/16/22 0519 05/16/22 0757 05/17/22 0435 05/17/22 0744 05/17/22 1321 05/17/22 1815 05/17/22 2202 05/18/22 0730 05/18/22 1246  WBC 9.9  --  9.9  --  10.6*  --   --   --   --  9.5  --   NEUTROABS 7.7  --   --   --  6.7  --   --   --   --  5.5  --   HGB 9.6*   < > 8.1*   < > 8.0*   < > 7.9* 8.3* 8.4* 7.5* 7.6*  HCT 29.1*   < > 25.2*   < > 24.6*   < > 24.8* 26.1* 26.3* 23.0* 23.7*  MCV 89.5  --  89.0  --  89.8  --   --   --   --  88.8  --   PLT 306  --  262  --  279  --   --   --   --  254  --    < > = values in this interval not displayed.   Cardiac Enzymes: No results for input(s): "CKTOTAL", "CKMB", "CKMBINDEX", "TROPONINI" in the last 168 hours. BNP: BNP (last 3 results) No results for input(s): "BNP" in the last 8760 hours.  ProBNP (last 3 results) No results for input(s): "PROBNP" in the last 8760 hours.  CBG: Recent Labs  Lab 05/17/22 1615 05/17/22 2052 05/18/22 0755 05/18/22 1203 05/18/22 1639  GLUCAP 115* 110* 119* 103* 117*       Signed:  Dia Crawford, MD [facility]

## 2022-05-18 NOTE — Progress Notes (Signed)
I attest to student documentation.  Ammie Ferrier, MSN-RN [place]

## 2022-05-19 ENCOUNTER — Encounter (HOSPITAL_COMMUNITY): Payer: Self-pay | Admitting: Gastroenterology

## 2022-05-28 DIAGNOSIS — I350 Nonrheumatic aortic (valve) stenosis: Secondary | ICD-10-CM | POA: Diagnosis not present

## 2022-05-28 DIAGNOSIS — Z8719 Personal history of other diseases of the digestive system: Secondary | ICD-10-CM | POA: Diagnosis not present

## 2022-05-28 DIAGNOSIS — K648 Other hemorrhoids: Secondary | ICD-10-CM | POA: Diagnosis not present

## 2022-05-28 DIAGNOSIS — I10 Essential (primary) hypertension: Secondary | ICD-10-CM | POA: Diagnosis not present

## 2022-05-28 DIAGNOSIS — K5790 Diverticulosis of intestine, part unspecified, without perforation or abscess without bleeding: Secondary | ICD-10-CM | POA: Diagnosis not present

## 2022-07-04 ENCOUNTER — Ambulatory Visit (HOSPITAL_COMMUNITY)
Admission: RE | Admit: 2022-07-04 | Discharge: 2022-07-04 | Disposition: A | Discharge status: Home / Self Care | Payer: Medicare Other | Source: Ambulatory Visit | Attending: Cardiology | Admitting: Cardiology

## 2022-07-04 DIAGNOSIS — I350 Nonrheumatic aortic (valve) stenosis: Secondary | ICD-10-CM | POA: Insufficient documentation

## 2022-07-04 NOTE — Progress Notes (Signed)
Echocardiogram 2D Echocardiogram has been performed.  Jacqueline Rich 07/04/2022, 9:55 AM

## 2022-07-14 DIAGNOSIS — I089 Rheumatic multiple valve disease, unspecified: Secondary | ICD-10-CM | POA: Diagnosis not present

## 2022-07-14 LAB — ECHOCARDIOGRAM COMPLETE
AR max vel: 0.69 cm2
AV Area VTI: 0.67 cm2
AV Area mean vel: 0.62 cm2
AV Mean grad: 50.8 mmHg
AV Peak grad: 79.5 mmHg
Ao pk vel: 4.46 m/s
Area-P 1/2: 2.8 cm2
Calc EF: 57.3 %
S' Lateral: 3.1 cm
Single Plane A2C EF: 57.6 %
Single Plane A4C EF: 58.6 %

## 2022-07-15 ENCOUNTER — Encounter: Payer: Self-pay | Admitting: Cardiology

## 2022-07-15 ENCOUNTER — Ambulatory Visit: Payer: Medicare Other | Admitting: Cardiology

## 2022-07-15 VITALS — BP 131/72 | HR 82 | Resp 17 | Ht 62.0 in | Wt 167.6 lb

## 2022-07-15 DIAGNOSIS — Z87891 Personal history of nicotine dependence: Secondary | ICD-10-CM

## 2022-07-15 DIAGNOSIS — Z8673 Personal history of transient ischemic attack (TIA), and cerebral infarction without residual deficits: Secondary | ICD-10-CM

## 2022-07-15 DIAGNOSIS — E119 Type 2 diabetes mellitus without complications: Secondary | ICD-10-CM

## 2022-07-15 DIAGNOSIS — I10 Essential (primary) hypertension: Secondary | ICD-10-CM | POA: Diagnosis not present

## 2022-07-15 DIAGNOSIS — I350 Nonrheumatic aortic (valve) stenosis: Secondary | ICD-10-CM

## 2022-07-15 DIAGNOSIS — E782 Mixed hyperlipidemia: Secondary | ICD-10-CM | POA: Diagnosis not present

## 2022-07-15 NOTE — Progress Notes (Signed)
ID:  Jacqueline Rich, DOB 02-26-1941, MRN [id number]  PCP:  Leeroy Cha, MD  Cardiologist:  Rex Kras, DO, [facility] (established care 02/08/2020) Former Cardiology Providers: Dr. Saverio Danker [facility], [place])  Date: 07/15/22 Last Office Visit: 01/14/2022  Chief Complaint  Patient presents with   Nonrheumatic aortic valve stenosis   Follow-up    HPI  Jacqueline Rich is a 82 y.o. female whose past medical history and cardiovascular risk factors include: Aortic stenosis, hypertension, type 2 diabetes without insulin use, hyperlipidemia, former smoker, TIA.  Patient being followed by the practice given her asymptomatic severe aortic stenosis.  She presents today for 108-month follow-up visit.  Over the last 6 months she denies anginal discomfort or heart failure symptoms.  She also denies any near-syncope or syncopal events.  Few months ago patient states that her blood pressures were not well-controlled and therefore PCP increased losartan to 50 mg p.o. daily.  And since then blood pressures have improved.  No change in overall physical endurance.  Recently had a ER visit/hospitalization due to diverticulosis/diverticulitis.  Echocardiogram from March 2024 independently reviewed with her during today's office visit and noted below for further reference.  ALLERGIES: Allergies  Allergen Reactions   Latex Other (See Comments)    Made the hands sore when worn   Nsaids Other (See Comments)    Internal bleeding (Meloxicam is suspected as the reason for today's ER visit)    MEDICATION LIST PRIOR TO VISIT: Current Meds  Medication Sig   acetaminophen (TYLENOL 8 HOUR ARTHRITIS PAIN) 650 MG CR tablet Take 650-1,300 mg by mouth every 8 (eight) hours as needed for pain.   atorvastatin (LIPITOR) 10 MG tablet Take 10 mg by mouth daily.   DILT-XR 240 MG 24 hr capsule Take 240 mg by mouth daily.   glimepiride (AMARYL) 1 MG tablet Take 1 mg by mouth daily.   Lancets  (ONETOUCH DELICA PLUS 123XX123) MISC Apply 1 each topically daily.   lansoprazole (PREVACID SOLUTAB) 15 MG disintegrating tablet Take 15-30 mg by mouth 2 (two) times daily as needed (for reflux symptoms or indigestion- dissolve orally).   losartan (COZAAR) 50 MG tablet Take 50 mg by mouth daily.   metoprolol succinate (TOPROL-XL) 25 MG 24 hr tablet TAKE 1 TABLET(25 MG) BY MOUTH DAILY (Patient taking differently: Take 25 mg by mouth daily.)   ONETOUCH ULTRA test strip 1 each daily.   potassium chloride SA (KLOR-CON) 20 MEQ tablet Take 20 mEq by mouth daily.   UNKNOWN TO PATIENT Place 1-2 drops into both eyes See admin instructions. Unnamed OTC eye drop for dryness- Instill 1-2 drops into both eyes up to 3 times a day as needed for dryness     PAST MEDICAL HISTORY: Past Medical History:  Diagnosis Date   Aortic stenosis    Diabetes mellitus without complication (HCC)    Hyperlipidemia    Hypertension    TIA (transient ischemic attack)     PAST SURGICAL HISTORY: Past Surgical History:  Procedure Laterality Date   CATARACT EXTRACTION Bilateral 11/27/2020   COLONOSCOPY WITH PROPOFOL N/A 05/17/2022   Procedure: COLONOSCOPY WITH PROPOFOL;  Surgeon: Otis Brace, MD;  Location: [facility];  Service: Gastroenterology;  Laterality: N/A;    FAMILY HISTORY: The patient family history includes Cancer in her brother; Cancer - Colon in her mother; Cancer - Lung in her brother; Heart attack in her brother; Hypertension in her brother, father, and sister.  SOCIAL HISTORY:  The patient  reports that she quit smoking  about 34 years ago. Her smoking use included cigarettes. She has a 7.00 pack-year smoking history. She has never used smokeless tobacco. She reports current alcohol use of about 2.0 standard drinks of alcohol per week. She reports that she does not use drugs.  REVIEW OF SYSTEMS: Review of Systems  Constitutional: Negative for malaise/fatigue.  Cardiovascular:  Negative for chest  pain, dyspnea on exertion, leg swelling, near-syncope, orthopnea, palpitations, paroxysmal nocturnal dyspnea and syncope.  Respiratory:  Negative for snoring.   Hematologic/Lymphatic: Negative for bleeding problem. Does not bruise/bleed easily.  Gastrointestinal:  Negative for melena.    PHYSICAL EXAM:    07/15/2022   10:20 AM 05/18/2022   12:49 PM 05/18/2022    8:00 AM  Vitals with BMI  Height 5\' 2"     Weight 167 lbs 10 oz    BMI 99991111    Systolic [id number]  Diastolic 72 75 65  Pulse 82 85 83   Physical Exam  Constitutional: No distress.  Age appropriate, hemodynamically stable.   Neck: No JVD present.  Cardiovascular: Normal rate, regular rhythm, S1 normal, S2 normal, intact distal pulses and normal pulses. Exam reveals no gallop, no S3 and no S4.  Murmur heard. Midsystolic murmur is present with a grade of 4/6 at the upper right sternal border radiating to the neck. Pulmonary/Chest: Effort normal and breath sounds normal. No stridor. She has no wheezes. She has no rales.  Abdominal: Soft. Bowel sounds are normal. She exhibits no distension. There is no abdominal tenderness.  Musculoskeletal:        General: No edema.     Cervical back: Neck supple.  Neurological: She is alert and oriented to person, place, and time. She has intact cranial nerves (2-12).  Skin: Skin is warm and moist.   CARDIAC DATABASE: EKG: 07/15/2022: Sinus rhythm, 80 bpm, normal axis, nonspecific T wave abnormality.  Echocardiogram: 05/22/2020: LVEF 55 to [id number], grade 2 diastolic dysfunction, moderately dilated left atrium, mild MR, severe aortic stenosis (AVA by VTI 0.65 cm, AVA 5 telemetry 0.74 cm, peak velocity 3.5 m/s, mean gradient 34.6 mmHg, dimensionless index 0.19)  12/06/2020: LVEF 55 to 60%, elevated LAP, GLS -22.4%, mild MR, trileaflet aortic valve, moderate/severe aortic stenosis (peak velocity 3.8 m/s, mean gradient 34.6 mmHg, AVA per VTI 0.7 cm, dimensional index 0.25)  07/04/2022 1. Left  ventricular ejection fraction, by estimation, is 60 to 65%. The left ventricle has normal function. The left ventricle has no regional wall motion abnormalities. Left ventricular diastolic parameters are indeterminate. Elevated left ventricular  end-diastolic pressure. 2. Right ventricular systolic function is low normal. The right ventricular size is normal. There is normal pulmonary artery systolic pressure. 3. The mitral valve is degenerative. Trivial mitral valve regurgitation. No evidence of mitral stenosis. Moderate mitral annular calcification. 4. Peak Velocity 4.42m/s, Peak Gradient 79.56mmHG, Mean Gradient 50.53mmHG, AVA 0.67cm2, Dimensional index 0.19. The aortic valve is tricuspid. Aortic valve regurgitation is not visualized. Severe aortic valve stenosis. 5. There is borderline dilatation of the ascending aorta, measuring 37 mm. 6. Rhythm strip during this exam demonstrates normal sinus rhythm.  Comparison(s): Prior study 12/03/2021: LVEF 60-65%, G2DD, elevated LAP, mild LAE, Mild MR, MAC, severe aortic stenosis (peak velocity 4.26m/s, MG 8mmHG, AVA by VTI 0.64cm2, DI 0.22).    Stress Testing: Exercise Myoview stress test 08/06/2021: Exercise nuclear stress test was performed using Bruce protocol.  1 Day Rest/Stress Protocol. Exercise time 4 minutes 30 seconds, achieved 6.43 METS, 86% of age-predicted maximum heart rate. Stress EKG  nondiagnostic for ischemia due to baseline ST-T changes. Normal myocardial perfusion without convincing evidence of reversible myocardial ischemia or prior infarct. Left ventricular size normal, wall thickness preserved, calculated LVEF 54%. Low risk study.  Heart Catheterization: None  Carotid duplex: 05/05/2019 (external): No hemodynamically significant stenosis or aberrant flow on either side, normal antegrade flow bilaterally.  LABORATORY DATA:    Latest Ref Rng & Units 05/18/2022   12:46 PM 05/18/2022    7:30 AM 05/17/2022   10:02 PM  CBC   WBC 4.0 - 10.5 K/uL  9.5    Hemoglobin 12.0 - 15.0 g/dL 7.6  7.5  8.4   Hematocrit 36.0 - 46.0 % 23.7  23.0  26.3   Platelets 150 - 400 K/uL  254         Latest Ref Rng & Units 05/18/2022    7:30 AM 05/17/2022    4:35 AM 05/16/2022    5:19 AM  CMP  Glucose 70 - 99 mg/dL 118  114  115   BUN 8 - 23 mg/dL 19  14  16    Creatinine 0.44 - 1.00 mg/dL 0.89  0.67  0.76   Sodium 135 - 145 mmol/L 137  141  136   Potassium 3.5 - 5.1 mmol/L 3.6  3.8  4.0   Chloride 98 - 111 mmol/L 106  108  106   CO2 22 - 32 mmol/L 26  22  23    Calcium 8.9 - 10.3 mg/dL 8.5  8.8  8.5   Total Protein 6.5 - 8.1 g/dL 6.9  7.3  6.7   Total Bilirubin 0.3 - 1.2 mg/dL 0.3  0.4  0.5   Alkaline Phos 38 - 126 U/L 60  60  54   AST 15 - 41 U/L 19  17  16    ALT 0 - 44 U/L 13  12  10      Lipid Panel     Component Value Date/Time   CHOL 132 05/17/2022 0435   TRIG 129 05/17/2022 0435   HDL 35 (L) 05/17/2022 0435   CHOLHDL 3.8 05/17/2022 0435   VLDL 26 05/17/2022 0435   LDLCALC 71 05/17/2022 0435    No components found for: "NTPROBNP" No results for input(s): "PROBNP" in the last 8760 hours.  No results for input(s): "TSH" in the last 8760 hours.  BMP Recent Labs    05/16/22 0519 05/17/22 0435 05/18/22 0730  NA 136 141 137  K 4.0 3.8 3.6  CL 106 108 106  CO2 23 22 26   GLUCOSE 115* 114* 118*  BUN 16 14 19   CREATININE 0.76 0.67 0.89  CALCIUM 8.5* 8.8* 8.5*  GFRNONAA >60 >60 >60     HEMOGLOBIN A1C Lab Results  Component Value Date   HGBA1C 5.9 (H) 05/15/2022   MPG 122.63 05/15/2022    IMPRESSION:    ICD-10-CM   1. Nonrheumatic aortic valve stenosis  I35.0 EKG 12-Lead    ECHOCARDIOGRAM COMPLETE    2. Benign hypertension  I10     3. Type 2 diabetes mellitus without complication, without long-term current use of insulin (HCC)  E11.9     4. Mixed hyperlipidemia  E78.2     5. Hx of TIA (transient ischemic attack) and stroke  Z86.73     6. Former smoker  Z87.891         RECOMMENDATIONS: Jacqueline Rich is a 82 y.o. female whose past medical history and cardiac risk factors include: Aortic stenosis, hypertension, type 2 diabetes without insulin use,  hyperlipidemia, former smoker, TIA, family history of CHF, sedentary lifestyle.  Nonrheumatic aortic valve stenosis Remains asymptomatic. Denies anginal discomfort, heart failure symptoms, near-syncope or syncopal events. Echo from March 2024 notes preserved LVEF with mild progression of the severity of aortic stenosis.   No findings to suggest critical AS as per echocardiography parameters. Repeat echocardiogram in 6 months to reevaluate disease progression. Medications reconciled.  Benign hypertension Office blood pressures are well-controlled. Medications reconciled.  Type 2 diabetes mellitus without complication, without long-term current use of insulin ([place]) Reeducated on the importance of glycemic control. Currently managed by primary care provider.  Mixed hyperlipidemia Currently on atorvastatin.   She denies myalgia or other side effects.  FINAL MEDICATION LIST END OF ENCOUNTER: No orders of the defined types were placed in this encounter.   Medications Discontinued During This Encounter  Medication Reason   losartan (COZAAR) 50 MG tablet Change in therapy      Current Outpatient Medications:    acetaminophen (TYLENOL 8 HOUR ARTHRITIS PAIN) 650 MG CR tablet, Take 650-1,300 mg by mouth every 8 (eight) hours as needed for pain., Disp: , Rfl:    atorvastatin (LIPITOR) 10 MG tablet, Take 10 mg by mouth daily., Disp: , Rfl:    DILT-XR 240 MG 24 hr capsule, Take 240 mg by mouth daily., Disp: , Rfl:    glimepiride (AMARYL) 1 MG tablet, Take 1 mg by mouth daily., Disp: , Rfl:    Lancets (ONETOUCH DELICA PLUS 123XX123) MISC, Apply 1 each topically daily., Disp: , Rfl:    lansoprazole (PREVACID SOLUTAB) 15 MG disintegrating tablet, Take 15-30 mg by mouth 2 (two) times daily as needed (for  reflux symptoms or indigestion- dissolve orally)., Disp: , Rfl:    losartan (COZAAR) 50 MG tablet, Take 50 mg by mouth daily., Disp: , Rfl:    metoprolol succinate (TOPROL-XL) 25 MG 24 hr tablet, TAKE 1 TABLET(25 MG) BY MOUTH DAILY (Patient taking differently: Take 25 mg by mouth daily.), Disp: 90 tablet, Rfl: 3   ONETOUCH ULTRA test strip, 1 each daily., Disp: , Rfl:    potassium chloride SA (KLOR-CON) 20 MEQ tablet, Take 20 mEq by mouth daily., Disp: , Rfl:    UNKNOWN TO PATIENT, Place 1-2 drops into both eyes See admin instructions. Unnamed OTC eye drop for dryness- Instill 1-2 drops into both eyes up to 3 times a day as needed for dryness, Disp: , Rfl:   Orders Placed This Encounter  Procedures   EKG 12-Lead   ECHOCARDIOGRAM COMPLETE    There are no Patient Instructions on file for this visit.   --Continue cardiac medications as reconciled in final medication list. --Return in about 6 months (around 01/15/2023) for Follow up aortic stenosis. Or sooner if needed. --Continue follow-up with your primary care physician regarding the management of your other chronic comorbid conditions.  Patient's questions and concerns were addressed to her satisfaction. She voices understanding of the instructions provided during this encounter.   This note was created using a voice recognition software as a result there may be grammatical errors inadvertently enclosed that do not reflect the nature of this encounter. Every attempt is made to correct such errors.  Rex Kras, [place], [facility]  Pager: [phone] Office: [phone]

## 2022-07-22 DIAGNOSIS — M79606 Pain in leg, unspecified: Secondary | ICD-10-CM | POA: Diagnosis not present

## 2022-07-22 DIAGNOSIS — M25569 Pain in unspecified knee: Secondary | ICD-10-CM | POA: Diagnosis not present

## 2022-07-22 DIAGNOSIS — K922 Gastrointestinal hemorrhage, unspecified: Secondary | ICD-10-CM | POA: Diagnosis not present

## 2022-07-22 DIAGNOSIS — E119 Type 2 diabetes mellitus without complications: Secondary | ICD-10-CM | POA: Diagnosis not present

## 2022-07-23 ENCOUNTER — Ambulatory Visit
Admission: RE | Admit: 2022-07-23 | Discharge: 2022-07-23 | Disposition: A | Discharge status: Home / Self Care | Payer: Medicare Other | Source: Ambulatory Visit | Attending: Internal Medicine | Admitting: Internal Medicine

## 2022-07-23 ENCOUNTER — Other Ambulatory Visit: Payer: Self-pay | Admitting: Internal Medicine

## 2022-07-23 DIAGNOSIS — M25561 Pain in right knee: Secondary | ICD-10-CM

## 2022-07-23 DIAGNOSIS — M1712 Unilateral primary osteoarthritis, left knee: Secondary | ICD-10-CM | POA: Diagnosis not present

## 2022-07-23 DIAGNOSIS — M25562 Pain in left knee: Secondary | ICD-10-CM

## 2022-07-23 DIAGNOSIS — Z471 Aftercare following joint replacement surgery: Secondary | ICD-10-CM | POA: Diagnosis not present

## 2022-07-23 DIAGNOSIS — M76892 Other specified enthesopathies of left lower limb, excluding foot: Secondary | ICD-10-CM | POA: Diagnosis not present

## 2022-08-01 DIAGNOSIS — M25562 Pain in left knee: Secondary | ICD-10-CM | POA: Diagnosis not present

## 2022-08-01 DIAGNOSIS — M25561 Pain in right knee: Secondary | ICD-10-CM | POA: Diagnosis not present

## 2022-08-12 ENCOUNTER — Encounter: Payer: Self-pay | Admitting: Podiatry

## 2022-08-12 ENCOUNTER — Ambulatory Visit: Payer: Medicare Other | Admitting: Podiatry

## 2022-08-12 DIAGNOSIS — B351 Tinea unguium: Secondary | ICD-10-CM

## 2022-08-12 DIAGNOSIS — M79609 Pain in unspecified limb: Secondary | ICD-10-CM

## 2022-08-12 DIAGNOSIS — E1169 Type 2 diabetes mellitus with other specified complication: Secondary | ICD-10-CM

## 2022-08-12 NOTE — Progress Notes (Signed)
This patient returns to my office for at risk foot care.  This patient requires this care by a professional since this patient will be at risk due to having diabetes.  This patient is unable to cut nails herself since the patient cannot reach her nails.These nails are painful walking and wearing shoes.  This patient presents for at risk foot care today.  General Appearance  Alert, conversant and in no acute stress.  Vascular  Dorsalis pedis and posterior tibial  pulses are palpable  bilaterally.  Capillary return is within normal limits  bilaterally. Temperature is within normal limits  bilaterally.  Neurologic  Senn-Weinstein monofilament wire test within normal limits  bilaterally. Muscle power within normal limits bilaterally.  Nails Thick disfigured discolored nails with subungual debris  from hallux to fifth toes bilaterally. No evidence of bacterial infection or drainage bilaterally.  Orthopedic  No limitations of motion  feet .  No crepitus or effusions noted.  No bony pathology.  Mallet toe third  B/L.  Skin  normotropic skin with no porokeratosis noted bilaterally.  No signs of infections or ulcers noted.     Onychomycosis  Pain in right toes  Pain in left toes  Consent was obtained for treatment procedures.   Mechanical debridement of nails 1-5  bilaterally performed with a nail nipper.  Filed with dremel without incident. Padding dispensed for third toes.   Return office visit    prn                 Told patient to return for periodic foot care and evaluation due to potential at risk complications.   Helane Gunther DPM

## 2022-08-18 ENCOUNTER — Encounter: Payer: Self-pay | Admitting: Cardiology

## 2022-08-21 ENCOUNTER — Telehealth: Payer: Self-pay

## 2022-08-21 NOTE — Telephone Encounter (Signed)
Called patient with surgical clearance information. 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 Emelda Brothers recommendations.

## 2022-08-27 DIAGNOSIS — M25562 Pain in left knee: Secondary | ICD-10-CM | POA: Diagnosis not present

## 2022-08-28 NOTE — Progress Notes (Signed)
Sent message, via epic in basket, requesting orders in epic from surgeon.  

## 2022-08-29 ENCOUNTER — Ambulatory Visit (HOSPITAL_COMMUNITY): Payer: Self-pay | Admitting: Emergency Medicine

## 2022-08-29 DIAGNOSIS — G8929 Other chronic pain: Secondary | ICD-10-CM

## 2022-08-29 NOTE — H&P (View-Only) (Signed)
TOTAL KNEE ADMISSION H&P  Patient is being admitted for left total knee arthroplasty.  Subjective:  Chief Complaint:left knee pain.  HPI: Athira L Sheetz, 82 y.o. female, has a history of pain and functional disability in the left knee due to arthritis and has failed non-surgical conservative treatments for greater than 12 weeks to includeNSAID's and/or analgesics, use of assistive devices, and activity modification.  Onset of symptoms was gradual, starting 7 years ago with gradually worsening course since that time. The patient noted no past surgery on the left knee(s).  Patient currently rates pain in the left knee(s) at 10 out of 10 with activity. Patient has night pain, worsening of pain with activity and weight bearing, pain that interferes with activities of daily living, and pain with passive range of motion.  Patient has evidence of periarticular osteophytes and joint space narrowing and mild subluxation by imaging studies. There is no active infection.  Patient Active Problem List   Diagnosis Date Noted   GIB (gastrointestinal bleeding) 05/15/2022   Abnormal mammogram 07/28/2020   Dyslipidemia 07/28/2020   Essential hypertension 07/28/2020   Gastroesophageal reflux disease 07/28/2020   History of malignant neoplasm of colon 07/28/2020   Type 2 diabetes mellitus with other specified complication (HCC) 07/28/2020   Nonrheumatic aortic valve stenosis    Past Medical History:  Diagnosis Date   Aortic stenosis    Diabetes mellitus without complication (HCC)    Hyperlipidemia    Hypertension    TIA (transient ischemic attack)     Past Surgical History:  Procedure Laterality Date   CATARACT EXTRACTION Bilateral 11/27/2020   COLONOSCOPY WITH PROPOFOL N/A 05/17/2022   Procedure: COLONOSCOPY WITH PROPOFOL;  Surgeon: Brahmbhatt, Parag, MD;  Location: [facility];  Service: Gastroenterology;  Laterality: N/A;    Current Outpatient Medications  Medication Sig Dispense Refill Last  Dose   acetaminophen (TYLENOL 8 HOUR ARTHRITIS PAIN) 650 MG CR tablet Take 650-1,300 mg by mouth every 8 (eight) hours as needed for pain.      atorvastatin (LIPITOR) 10 MG tablet Take 10 mg by mouth daily.      DILT-XR 240 MG 24 hr capsule Take 240 mg by mouth daily.      glimepiride (AMARYL) 1 MG tablet Take 1 mg by mouth daily.      Lancets (ONETOUCH DELICA PLUS LANCET33G) MISC Apply 1 each topically daily.      lansoprazole (PREVACID SOLUTAB) 15 MG disintegrating tablet Take 15-30 mg by mouth 2 (two) times daily as needed (for reflux symptoms or indigestion- dissolve orally).      losartan (COZAAR) 50 MG tablet Take 50 mg by mouth daily.      metoprolol succinate (TOPROL-XL) 25 MG 24 hr tablet TAKE 1 TABLET(25 MG) BY MOUTH DAILY (Patient taking differently: Take 25 mg by mouth daily.) 90 tablet 3    ONETOUCH ULTRA test strip 1 each daily.      potassium chloride SA (KLOR-CON) 20 MEQ tablet Take 20 mEq by mouth daily.      UNKNOWN TO PATIENT Place 1-2 drops into both eyes See admin instructions. Unnamed OTC eye drop for dryness- Instill 1-2 drops into both eyes up to 3 times a day as needed for dryness      No current facility-administered medications for this visit.   Allergies  Allergen Reactions   Latex Other (See Comments)    Made the hands sore when worn   Nsaids Other (See Comments)    Internal bleeding (Meloxicam is suspected as the   reason for today's ER visit)    Social History   Tobacco Use   Smoking status: Former    Packs/day: 1.00    Years: 7.00    Additional pack years: 0.00    Total pack years: 7.00    Types: Cigarettes    Quit date: 1990    Years since quitting: 34.3   Smokeless tobacco: Never  Substance Use Topics   Alcohol use: Yes    Alcohol/week: 2.0 standard drinks of alcohol    Types: 1 Glasses of wine, 1 Cans of beer per week    Comment: occasionally    Family History  Problem Relation Age of Onset   Cancer - Colon Mother    Hypertension Father     Cancer - Lung Brother    Hypertension Sister    Cancer Brother    Heart attack Brother    Hypertension Brother      Review of Systems  Musculoskeletal:  Positive for arthralgias.  All other systems reviewed and are negative.   Objective:  Physical Exam Constitutional:      General: She is not in acute distress.    Appearance: Normal appearance. She is not ill-appearing.  HENT:     Head: Normocephalic and atraumatic.     Right Ear: External ear normal.     Left Ear: External ear normal.     Nose: Nose normal.     Mouth/Throat:     Mouth: Mucous membranes are moist.     Pharynx: Oropharynx is clear.  Eyes:     Extraocular Movements: Extraocular movements intact.     Conjunctiva/sclera: Conjunctivae normal.  Cardiovascular:     Rate and Rhythm: Normal rate and regular rhythm.     Pulses: Normal pulses.     Heart sounds: Normal heart sounds.  Pulmonary:     Effort: Pulmonary effort is normal.     Breath sounds: Normal breath sounds.  Abdominal:     General: Bowel sounds are normal.     Palpations: Abdomen is soft.     Tenderness: There is no abdominal tenderness.  Musculoskeletal:        General: Tenderness present.     Cervical back: Normal range of motion and neck supple.     Comments: Left knee: TTP over medial and lateral joint lines, medial worse.  Mild pain elicited with ROM in all directions.  Mildly antalgic gait.  ROM 0-100 left, 0-95 right.  BLE appear grossly neurovascularly intact.  No lesion over area of chief complaint.  Skin:    General: Skin is warm and dry.  Neurological:     Mental Status: She is alert and oriented to person, place, and time. Mental status is at baseline.  Psychiatric:        Mood and Affect: Mood normal.        Behavior: Behavior normal.     Vital signs in last 24 hours: @VSRANGES@  Labs:   Estimated body mass index is 30.65 kg/m as calculated from the following:   Height as of 07/15/22: 5' 2" (1.575 m).   Weight as of  07/15/22: 76 kg.   Imaging Review Plain radiographs demonstrate severe degenerative joint disease of the left knee(s). The bone quality appears to be good for age and reported activity level.      Assessment/Plan:  End stage arthritis, left knee   The patient history, physical examination, clinical judgment of the provider and imaging studies are consistent with end stage degenerative joint disease   of the left knee(s) and total knee arthroplasty is deemed medically necessary. The treatment options including medical management, injection therapy arthroscopy and arthroplasty were discussed at length. The risks and benefits of total knee arthroplasty were presented and reviewed. The risks due to aseptic loosening, infection, stiffness, patella tracking problems, thromboembolic complications and other imponderables were discussed. The patient acknowledged the explanation, agreed to proceed with the plan and consent was signed. Patient is being admitted for inpatient treatment for surgery, pain control, PT, OT, prophylactic antibiotics, VTE prophylaxis, progressive ambulation and ADL's and discharge planning. The patient is planning to be admitted for observation, with anticipated discharge home with outpatient PT.     Patient's anticipated LOS is less than 2 midnights, meeting these requirements: - Younger than 65 - Lives within 1 hour of care - Has a competent adult at home to recover with post-op recover - NO history of  - Chronic pain requiring opiods  - Diabetes  - Coronary Artery Disease  - Heart failure  - Heart attack  - Stroke  - DVT/VTE  - Cardiac arrhythmia  - Respiratory Failure/COPD  - Renal failure  - Anemia  - Advanced Liver disease   

## 2022-08-29 NOTE — H&P (Signed)
TOTAL KNEE ADMISSION H&P  Patient is being admitted for left total knee arthroplasty.  Subjective:  Chief Complaint:left knee pain.  HPI: Jacqueline Rich, 82 y.o. female, has a history of pain and functional disability in the left knee due to arthritis and has failed non-surgical conservative treatments for greater than 12 weeks to includeNSAID's and/or analgesics, use of assistive devices, and activity modification.  Onset of symptoms was gradual, starting 7 years ago with gradually worsening course since that time. The patient noted no past surgery on the left knee(s).  Patient currently rates pain in the left knee(s) at 10 out of 10 with activity. Patient has night pain, worsening of pain with activity and weight bearing, pain that interferes with activities of daily living, and pain with passive range of motion.  Patient has evidence of periarticular osteophytes and joint space narrowing and mild subluxation by imaging studies. There is no active infection.  Patient Active Problem List   Diagnosis Date Noted   GIB (gastrointestinal bleeding) 05/15/2022   Abnormal mammogram 07/28/2020   Dyslipidemia 07/28/2020   Essential hypertension 07/28/2020   Gastroesophageal reflux disease 07/28/2020   History of malignant neoplasm of colon 07/28/2020   Type 2 diabetes mellitus with other specified complication (HCC) 07/28/2020   Nonrheumatic aortic valve stenosis    Past Medical History:  Diagnosis Date   Aortic stenosis    Diabetes mellitus without complication (HCC)    Hyperlipidemia    Hypertension    TIA (transient ischemic attack)     Past Surgical History:  Procedure Laterality Date   CATARACT EXTRACTION Bilateral 11/27/2020   COLONOSCOPY WITH PROPOFOL N/A 05/17/2022   Procedure: COLONOSCOPY WITH PROPOFOL;  Surgeon: Kathi Der, MD;  Location: [facility];  Service: Gastroenterology;  Laterality: N/A;    Current Outpatient Medications  Medication Sig Dispense Refill Last  Dose   acetaminophen (TYLENOL 8 HOUR ARTHRITIS PAIN) 650 MG CR tablet Take 650-1,300 mg by mouth every 8 (eight) hours as needed for pain.      atorvastatin (LIPITOR) 10 MG tablet Take 10 mg by mouth daily.      DILT-XR 240 MG 24 hr capsule Take 240 mg by mouth daily.      glimepiride (AMARYL) 1 MG tablet Take 1 mg by mouth daily.      Lancets (ONETOUCH DELICA PLUS LANCET33G) MISC Apply 1 each topically daily.      lansoprazole (PREVACID SOLUTAB) 15 MG disintegrating tablet Take 15-30 mg by mouth 2 (two) times daily as needed (for reflux symptoms or indigestion- dissolve orally).      losartan (COZAAR) 50 MG tablet Take 50 mg by mouth daily.      metoprolol succinate (TOPROL-XL) 25 MG 24 hr tablet TAKE 1 TABLET(25 MG) BY MOUTH DAILY (Patient taking differently: Take 25 mg by mouth daily.) 90 tablet 3    ONETOUCH ULTRA test strip 1 each daily.      potassium chloride SA (KLOR-CON) 20 MEQ tablet Take 20 mEq by mouth daily.      UNKNOWN TO PATIENT Place 1-2 drops into both eyes See admin instructions. Unnamed OTC eye drop for dryness- Instill 1-2 drops into both eyes up to 3 times a day as needed for dryness      No current facility-administered medications for this visit.   Allergies  Allergen Reactions   Latex Other (See Comments)    Made the hands sore when worn   Nsaids Other (See Comments)    Internal bleeding (Meloxicam is suspected as the  reason for today's ER visit)    Social History   Tobacco Use   Smoking status: Former    Packs/day: 1.00    Years: 7.00    Additional pack years: 0.00    Total pack years: 7.00    Types: Cigarettes    Quit date: 1990    Years since quitting: 34.3   Smokeless tobacco: Never  Substance Use Topics   Alcohol use: Yes    Alcohol/week: 2.0 standard drinks of alcohol    Types: 1 Glasses of wine, 1 Cans of beer per week    Comment: occasionally    Family History  Problem Relation Age of Onset   Cancer - Colon Mother    Hypertension Father     Cancer - Lung Brother    Hypertension Sister    Cancer Brother    Heart attack Brother    Hypertension Brother      Review of Systems  Musculoskeletal:  Positive for arthralgias.  All other systems reviewed and are negative.   Objective:  Physical Exam Constitutional:      General: She is not in acute distress.    Appearance: Normal appearance. She is not ill-appearing.  HENT:     Head: Normocephalic and atraumatic.     Right Ear: External ear normal.     Left Ear: External ear normal.     Nose: Nose normal.     Mouth/Throat:     Mouth: Mucous membranes are moist.     Pharynx: Oropharynx is clear.  Eyes:     Extraocular Movements: Extraocular movements intact.     Conjunctiva/sclera: Conjunctivae normal.  Cardiovascular:     Rate and Rhythm: Normal rate and regular rhythm.     Pulses: Normal pulses.     Heart sounds: Normal heart sounds.  Pulmonary:     Effort: Pulmonary effort is normal.     Breath sounds: Normal breath sounds.  Abdominal:     General: Bowel sounds are normal.     Palpations: Abdomen is soft.     Tenderness: There is no abdominal tenderness.  Musculoskeletal:        General: Tenderness present.     Cervical back: Normal range of motion and neck supple.     Comments: Left knee: TTP over medial and lateral joint lines, medial worse.  Mild pain elicited with ROM in all directions.  Mildly antalgic gait.  ROM 0-100 left, 0-95 right.  BLE appear grossly neurovascularly intact.  No lesion over area of chief complaint.  Skin:    General: Skin is warm and dry.  Neurological:     Mental Status: She is alert and oriented to person, place, and time. Mental status is at baseline.  Psychiatric:        Mood and Affect: Mood normal.        Behavior: Behavior normal.     Vital signs in last 24 hours: @VSRANGES @  Labs:   Estimated body mass index is 30.65 kg/m as calculated from the following:   Height as of 07/15/22: 5\' 2"  (1.575 m).   Weight as of  07/15/22: 76 kg.   Imaging Review Plain radiographs demonstrate severe degenerative joint disease of the left knee(s). The bone quality appears to be good for age and reported activity level.      Assessment/Plan:  End stage arthritis, left knee   The patient history, physical examination, clinical judgment of the provider and imaging studies are consistent with end stage degenerative joint disease  of the left knee(s) and total knee arthroplasty is deemed medically necessary. The treatment options including medical management, injection therapy arthroscopy and arthroplasty were discussed at length. The risks and benefits of total knee arthroplasty were presented and reviewed. The risks due to aseptic loosening, infection, stiffness, patella tracking problems, thromboembolic complications and other imponderables were discussed. The patient acknowledged the explanation, agreed to proceed with the plan and consent was signed. Patient is being admitted for inpatient treatment for surgery, pain control, PT, OT, prophylactic antibiotics, VTE prophylaxis, progressive ambulation and ADL's and discharge planning. The patient is planning to be admitted for observation, with anticipated discharge home with outpatient PT.     Patient's anticipated LOS is less than 2 midnights, meeting these requirements: - Younger than 61 - Lives within 1 hour of care - Has a competent adult at home to recover with post-op recover - NO history of  - Chronic pain requiring opiods  - Diabetes  - Coronary Artery Disease  - Heart failure  - Heart attack  - Stroke  - DVT/VTE  - Cardiac arrhythmia  - Respiratory Failure/COPD  - Renal failure  - Anemia  - Advanced Liver disease

## 2022-09-03 ENCOUNTER — Ambulatory Visit (INDEPENDENT_AMBULATORY_CARE_PROVIDER_SITE_OTHER): Payer: Medicare Other

## 2022-09-03 ENCOUNTER — Ambulatory Visit: Payer: Medicare Other | Admitting: Podiatry

## 2022-09-03 DIAGNOSIS — M19072 Primary osteoarthritis, left ankle and foot: Secondary | ICD-10-CM

## 2022-09-03 DIAGNOSIS — M25572 Pain in left ankle and joints of left foot: Secondary | ICD-10-CM | POA: Diagnosis not present

## 2022-09-03 DIAGNOSIS — M21619 Bunion of unspecified foot: Secondary | ICD-10-CM | POA: Diagnosis not present

## 2022-09-03 DIAGNOSIS — M21612 Bunion of left foot: Secondary | ICD-10-CM | POA: Diagnosis not present

## 2022-09-03 DIAGNOSIS — E119 Type 2 diabetes mellitus without complications: Secondary | ICD-10-CM

## 2022-09-03 DIAGNOSIS — M7752 Other enthesopathy of left foot: Secondary | ICD-10-CM

## 2022-09-03 MED ORDER — TRIAMCINOLONE ACETONIDE 10 MG/ML IJ SUSP
10.0000 mg | Freq: Once | INTRAMUSCULAR | Status: AC
  Administered 2022-09-03: 10 mg

## 2022-09-03 NOTE — Progress Notes (Addendum)
Anesthesia Review: Anesthesia Review:  PCP: DR Lendon Collar 08/14/22 on chart LOV 07/22/22- on chart  Cardiologist : DR Odis Hollingshead LOV 07/05/22 Chest x-ray : EKG : 07/15/22  Echo : 07/04/22  Stress test: 08/08/21  Cardiac Cath :  Activity level: can not do a flight of stairs without difficutly  Sleep Study/ CPAP : none  Fasting Blood Sugar :      / Checks Blood Sugar -- times a day:   Blood Thinner/ Instructions /Last Dose: ASA / Instructions/ Last Dose :    DM- type2- checks glucose once daily in am  Hgba1c-  09/04/22- 6.5   Amaryl- none am of surgery  CBC done 09/04/22- roitued to DR [employer].

## 2022-09-03 NOTE — Progress Notes (Signed)
Chief Complaint  Patient presents with   Bunions    Left foot, has tenderness in the foot when walking or bending, has been an issue for two weeks     HPI: 82 y.o. female presenting today for bunion evaluation.  Patient is a type II diabetic, well-controlled.  Her last A1c level was 5.9 on May 15, 2022.  She denies any recent injury to the left foot.  Notes that when she called to make the appointment 2 weeks ago she was having extreme pain around the left bunion joint.  Denies history of gout.  Denies excessive swelling or redness to the area.  She does wear a more narrow toed shoe on a regular basis.  She has had pain to this area in the past and states that by another specialist had given her a cortisone injection and she had relief with this.  HPI   Past Medical History:  Diagnosis Date   Aortic stenosis    Diabetes mellitus without complication (HCC)    Hyperlipidemia    Hypertension    TIA (transient ischemic attack)     Past Surgical History:  Procedure Laterality Date   CATARACT EXTRACTION Bilateral 11/27/2020   COLONOSCOPY WITH PROPOFOL N/A 05/17/2022   Procedure: COLONOSCOPY WITH PROPOFOL;  Surgeon: Kathi Der, MD;  Location: [facility];  Service: Gastroenterology;  Laterality: N/A;    Allergies  Allergen Reactions   Latex Other (See Comments)    Made the hands sore when worn   Nsaids Other (See Comments)    Internal bleeding (Meloxicam is suspected as the reason for today's ER visit)    Review of Systems  Constitutional: Negative.   Musculoskeletal:  Positive for joint pain. Negative for falls.  Skin: Negative.   Neurological:  Negative for sensory change.     PHYSICAL EXAM:  General: The patient is alert and oriented x3 in no acute distress.  Dermatology: Skin is warm, dry and supple bilateral lower extremities. Interspaces are clear of maceration and debris.  No rashes noted.  No edema ,erythema , or calor noted to left first MPJ  area.  Vascular: Palpable pedal pulses bilaterally. Capillary refill within normal limits.    Neurological: Light touch sensation grossly intact bilateral feet. Protective sensation intact.  Musculoskeletal Exam:  There is a bony medial prominence on the dorsomedial aspect of the 1st metatarsal head of the left foot.  There is pain on palpation of the "bump" in this area.  Minimal lateral deviation of the hallux at the MPJ level.  1st MPJ ROM is decreased.  No crepitus.     RADIOGRAPHIC EXAM (Left foot 3 views on 09/03/22): There is a short first metatarsal noted.  First intermetatarsal angle is approximately 10 degrees.  There is uneven joint space narrowing at the first metatarsophalangeal joint.  There is enlargement of the dorsal medial aspect of the head of the first metatarsal with small cystic changes in this area.  No fracture noted.  No periosteal reaction seen.  There is a inferior calcaneal spur present and a small posterior calcaneal spur noted.    ASSESSMENT/PLAN OF CARE:  Bunion left foot. Joint pain, Left 1st MPJ. 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  Encounter for diabetic foot exam. Capsulitis left 1st MPJ  Meds ordered this encounter  Medications   triamcinolone acetonide (KENALOG) 10 MG/ML injection 10 mg    Discussed patient's condition and possible etiologies today.  Discussed conservative treatment options with patient today, including shoe modification /  arch supports, off-loading, cortisone injectione, NSAID topical / oral therapy, and toe splints and shields.  Briefly discussed surgical intervention if conservative options are not successful.    With the patient's consent and after sterile skin prep over the left first metatarsal phalangeal joint, a 1 cc injection of Kenalog 10 and 1% lidocaine plain were administered to the area uneventfully.  Patient tolerated this well.  A Band-Aid was applied postinjection.  She was instructed to keep this on for the rest of the day and  keep the area clean and dry.  Discussed shoe gear with the patient noting that her current shoes appear to be slightly narrow in the toebox and could be aggravating this condition.  Also discussed daily inspection of the feet with diabetes and importance of proper blood glucose control which it does seem that she manages very well.  Return if symptoms worsen or fail to improve.    Clerance Lav, DPM, FACFAS [facility]     [address] [place], [place] [address]                Office [phone]  Fax [phone]

## 2022-09-03 NOTE — Patient Instructions (Addendum)
SURGICAL WAITING ROOM VISITATION  Patients having surgery or a procedure may have no more than 2 support people in the waiting area - these visitors may rotate.    Children under the age of 19 must have an adult with them who is not the patient.  Due to an increase in RSV and influenza rates and associated hospitalizations, children ages 83 and under may not visit patients in [facility] hospitals.  If the patient needs to stay at the hospital during part of their recovery, the visitor guidelines for inpatient rooms apply. Pre-op nurse will coordinate an appropriate time for 1 support person to accompany patient in pre-op.  This support person may not rotate.    Please refer to the [facility] website for the visitor guidelines for Inpatients (after your surgery is over and you are in a regular room).       Your procedure is scheduled on:  09/16/2022    Report to [facility] Main Entrance    Report to admitting at  0630 AM   Call this number if you have problems the morning of surgery [phone]   Do not eat food :After Midnight.   After Midnight you may have the following liquids until _  0600_____ AM  DAY OF SURGERY  Water Non-Citrus Juices (without pulp, NO RED-Apple, White grape, White cranberry) Black Coffee (NO MILK/CREAM OR CREAMERS, sugar ok)  Clear Tea (NO MILK/CREAM OR CREAMERS, sugar ok) regular and decaf                             Plain Jell-O (NO RED)                                           Fruit ices (not with fruit pulp, NO RED)                                     Popsicles (NO RED)                                                               Sports drinks like Gatorade (NO RED)                    The day of surgery:  Drink ONE (1) Pre-Surgery Clear Ensure or G2 at  0600AM   ( have completed by ) the morning of surgery. Drink in one sitting. Do not sip.  This drink was given to you during your hospital  pre-op appointment visit. Nothing else to  drink after completing the  Pre-Surgery Clear Ensure or G2.          If you have questions, please contact your surgeon's office.     Oral Hygiene is also important to reduce your risk of infection.                                    Remember - BRUSH YOUR TEETH THE MORNING OF SURGERY WITH  YOUR REGULAR TOOTHPASTE  DENTURES WILL BE REMOVED PRIOR TO SURGERY PLEASE DO NOT APPLY "Poly grip" OR ADHESIVES!!!   Do NOT smoke after Midnight   Take these medicines the morning of surgery with A SIP OF WATER:  Dilt-XR, prevacidk toprol   DO NOT TAKE ANY ORAL DIABETIC MEDICATIONS DAY OF YOUR SURGERY  Bring CPAP mask and tubing day of surgery.                              You may not have any metal on your body including hair pins, jewelry, and body piercing             Do not wear make-up, lotions, powders, perfumes/cologne, or deodorant  Do not wear nail polish including gel and S&S, artificial/acrylic nails, or any other type of covering on natural nails including finger and toenails. If you have artificial nails, gel coating, etc. that needs to be removed by a nail salon please have this removed prior to surgery or surgery may need to be canceled/ delayed if the surgeon/ anesthesia feels like they are unable to be safely monitored.   Do not shave  48 hours prior to surgery.               Men may shave face and neck.   Do not bring valuables to the hospital.  IS NOT             RESPONSIBLE   FOR VALUABLES.   Contacts, glasses, dentures or bridgework may not be worn into surgery.   Bring small overnight bag day of surgery.   DO NOT BRING YOUR HOME MEDICATIONS TO THE HOSPITAL. PHARMACY WILL DISPENSE MEDICATIONS LISTED ON YOUR MEDICATION LIST TO YOU DURING YOUR ADMISSION IN THE HOSPITAL!    Patients discharged on the day of surgery will not be allowed to drive home.  Someone NEEDS to stay with you for the first 24 hours after anesthesia.   Special Instructions: Bring a copy of  your healthcare power of attorney and living will documents the day of surgery if you haven't scanned them before.              Please read over the following fact sheets you were given: IF YOU HAVE QUESTIONS ABOUT YOUR PRE-OP INSTRUCTIONS PLEASE CALL [phone]   If you received a COVID test during your pre-op visit  it is requested that you wear a mask when out in public, stay away from anyone that may not be feeling well and notify your surgeon if you develop symptoms. If you test positive for Covid or have been in contact with anyone that has tested positive in the last 10 days please notify you surgeon.

## 2022-09-04 ENCOUNTER — Encounter (HOSPITAL_COMMUNITY): Payer: Self-pay

## 2022-09-04 ENCOUNTER — Other Ambulatory Visit: Payer: Self-pay

## 2022-09-04 ENCOUNTER — Encounter (HOSPITAL_COMMUNITY)
Admission: RE | Admit: 2022-09-04 | Discharge: 2022-09-04 | Disposition: A | Discharge status: Home / Self Care | Payer: Medicare Other | Source: Ambulatory Visit | Attending: Orthopedic Surgery | Admitting: Orthopedic Surgery

## 2022-09-04 VITALS — BP 132/78 | HR 65 | Temp 98.6°F | Resp 16 | Ht 64.0 in | Wt 168.0 lb

## 2022-09-04 DIAGNOSIS — M1712 Unilateral primary osteoarthritis, left knee: Secondary | ICD-10-CM | POA: Insufficient documentation

## 2022-09-04 DIAGNOSIS — E119 Type 2 diabetes mellitus without complications: Secondary | ICD-10-CM | POA: Insufficient documentation

## 2022-09-04 DIAGNOSIS — G8929 Other chronic pain: Secondary | ICD-10-CM | POA: Insufficient documentation

## 2022-09-04 DIAGNOSIS — Z87891 Personal history of nicotine dependence: Secondary | ICD-10-CM | POA: Diagnosis not present

## 2022-09-04 DIAGNOSIS — Z8673 Personal history of transient ischemic attack (TIA), and cerebral infarction without residual deficits: Secondary | ICD-10-CM | POA: Diagnosis not present

## 2022-09-04 DIAGNOSIS — Z01812 Encounter for preprocedural laboratory examination: Secondary | ICD-10-CM | POA: Insufficient documentation

## 2022-09-04 DIAGNOSIS — I350 Nonrheumatic aortic (valve) stenosis: Secondary | ICD-10-CM | POA: Diagnosis not present

## 2022-09-04 DIAGNOSIS — I10 Essential (primary) hypertension: Secondary | ICD-10-CM | POA: Diagnosis not present

## 2022-09-04 DIAGNOSIS — Z01818 Encounter for other preprocedural examination: Secondary | ICD-10-CM

## 2022-09-04 DIAGNOSIS — Z7984 Long term (current) use of oral hypoglycemic drugs: Secondary | ICD-10-CM | POA: Insufficient documentation

## 2022-09-04 DIAGNOSIS — M25562 Pain in left knee: Secondary | ICD-10-CM | POA: Insufficient documentation

## 2022-09-04 HISTORY — DX: Cerebral infarction, unspecified: I63.9

## 2022-09-04 HISTORY — DX: Unspecified osteoarthritis, unspecified site: M19.90

## 2022-09-04 HISTORY — DX: Malignant (primary) neoplasm, unspecified: C80.1

## 2022-09-04 HISTORY — DX: Cardiac murmur, unspecified: R01.1

## 2022-09-04 HISTORY — DX: Gastro-esophageal reflux disease without esophagitis: K21.9

## 2022-09-04 LAB — TYPE AND SCREEN
ABO/RH(D): O POS
Antibody Screen: NEGATIVE

## 2022-09-04 LAB — CBC WITH DIFFERENTIAL/PLATELET
Abs Immature Granulocytes: 0.03 10*3/uL (ref 0.00–0.07)
Basophils Absolute: 0 10*3/uL (ref 0.0–0.1)
Basophils Relative: 0 %
Eosinophils Absolute: 0 10*3/uL (ref 0.0–0.5)
Eosinophils Relative: 0 %
HCT: 32.8 % — ABNORMAL LOW (ref 36.0–46.0)
Hemoglobin: 10.3 g/dL — ABNORMAL LOW (ref 12.0–15.0)
Immature Granulocytes: 0 %
Lymphocytes Relative: 17 %
Lymphs Abs: 2 10*3/uL (ref 0.7–4.0)
MCH: 26 pg (ref 26.0–34.0)
MCHC: 31.4 g/dL (ref 30.0–36.0)
MCV: 82.8 fL (ref 80.0–100.0)
Monocytes Absolute: 0.7 10*3/uL (ref 0.1–1.0)
Monocytes Relative: 6 %
Neutro Abs: 9.1 10*3/uL — ABNORMAL HIGH (ref 1.7–7.7)
Neutrophils Relative %: 77 %
Platelets: 372 10*3/uL (ref 150–400)
RBC: 3.96 MIL/uL (ref 3.87–5.11)
RDW: 14.7 % (ref 11.5–15.5)
WBC: 11.8 10*3/uL — ABNORMAL HIGH (ref 4.0–10.5)
nRBC: 0 % (ref 0.0–0.2)

## 2022-09-04 LAB — SURGICAL PCR SCREEN
MRSA, PCR: NEGATIVE
Staphylococcus aureus: NEGATIVE

## 2022-09-04 LAB — COMPREHENSIVE METABOLIC PANEL
ALT: 13 U/L (ref 0–44)
AST: 16 U/L (ref 15–41)
Albumin: 3.8 g/dL (ref 3.5–5.0)
Alkaline Phosphatase: 67 U/L (ref 38–126)
Anion gap: 9 (ref 5–15)
BUN: 14 mg/dL (ref 8–23)
CO2: 22 mmol/L (ref 22–32)
Calcium: 9.4 mg/dL (ref 8.9–10.3)
Chloride: 105 mmol/L (ref 98–111)
Creatinine, Ser: 0.79 mg/dL (ref 0.44–1.00)
GFR, Estimated: >60 mL/min (ref >60)
Glucose, Bld: 105 mg/dL — ABNORMAL HIGH (ref 70–99)
Potassium: 3.4 mmol/L — ABNORMAL LOW (ref 3.5–5.1)
Sodium: 136 mmol/L (ref 135–145)
Total Bilirubin: 0.4 mg/dL (ref 0.3–1.2)
Total Protein: 8.4 g/dL — ABNORMAL HIGH (ref 6.5–8.1)

## 2022-09-04 LAB — HEMOGLOBIN A1C
Hgb A1c MFr Bld: 6.5 % — ABNORMAL HIGH (ref 4.8–5.6)
Mean Plasma Glucose: 139.85 mg/dL

## 2022-09-04 LAB — GLUCOSE, CAPILLARY: Glucose-Capillary: 102 mg/dL — ABNORMAL HIGH (ref 70–99)

## 2022-09-05 NOTE — Progress Notes (Signed)
Anesthesia Chart Review   Case: [id number] Date/Time: 09/16/22 0845   Procedure: TOTAL KNEE ARTHROPLASTY (Left: Knee)   Anesthesia type: Spinal   Pre-op diagnosis: OA LEFT KNEE   Location: [facility] ROOM 07 / [facility]   Surgeons: Joen Laura, MD       DISCUSSION:82 y.o. former smoker with h/o HTN, Stroke, severe AS, DM II, colon cancer, left knee OA scheduled for above procedure 09/16/2022 with Dr. Danton Sewer.   Pt with severe AS, mean gradient 50.8 mmHg, valve area 0.67 cm2. Last seen by cardiology 07/15/2022. Per OV note pt remains asymptomatic. Cardio will repeat echo in 6 months.  Per notes no findings of critical AS. BP well controlled.   Per cardiology note 08/18/22, "TAMILIA STOYER is optimized from cardiac standpoint; however, given her co-morbid condition especially asymptomatic severe aortic stenosis she is at least moderate risk from a cardiac standpoint, for her upcoming non cardiac surgery: left total knee replacement.     She is optimized from cardiac standpoint but would recommend the blood pressures are monitored closely and avoid hypotension. Recommend that the surgery is at a hospital and not surgical center.    This preoperative risk assessment is a tool to assist the surgeon in estimating the cardiac risk for the proposed upcoming noncardiac surgery.  The shared decision to proceed with surgery will be ultimately at the discretion of the patient after the surgical risks, benefits, and alternatives have been discussed amongst the patient and her surgical team. "  Anticipate pt can proceed with planned procedure barring acute status change.   VS: BP 132/78   Pulse 65   Temp 37 C (Oral)   Resp 16   Ht 5\' 4"  (1.626 m)   Wt 76.2 kg   SpO2 96%   BMI 28.84 kg/m   PROVIDERS: [name], MD is PCP   [name], DO is Cardiologist  LABS: Labs reviewed: Acceptable for surgery. (all labs ordered are listed, but only abnormal results are  displayed)  Labs Reviewed  CBC WITH DIFFERENTIAL/PLATELET - Abnormal; Notable for the following components:      Result Value   WBC 11.8 (*)    Hemoglobin 10.3 (*)    HCT 32.8 (*)    Neutro Abs 9.1 (*)    All other components within normal limits  COMPREHENSIVE METABOLIC PANEL - Abnormal; Notable for the following components:   Potassium 3.4 (*)    Glucose, Bld 105 (*)    Total Protein 8.4 (*)    All other components within normal limits  HEMOGLOBIN A1C - Abnormal; Notable for the following components:   Hgb A1c MFr Bld 6.5 (*)    All other components within normal limits  GLUCOSE, CAPILLARY - Abnormal; Notable for the following components:   Glucose-Capillary 102 (*)    All other components within normal limits  SURGICAL PCR SCREEN  TYPE AND SCREEN     IMAGES:   EKG:   CV: Echo 07/04/2022  1. Left ventricular ejection fraction, by estimation, is 60 to 65%. The  left ventricle has normal function. The left ventricle has no regional  wall motion abnormalities. Left ventricular diastolic parameters are  indeterminate. Elevated left ventricular  end-diastolic pressure.   2. Right ventricular systolic function is low normal. The right  ventricular size is normal. There is normal pulmonary artery systolic  pressure.   3. The mitral valve is degenerative. Trivial mitral valve regurgitation.  No evidence of mitral stenosis. Moderate mitral annular calcification.  4. Peak Velocity 4.39m/s, Peak Gradient 79.53mmHG, Mean Gradient 50.46mmHG,  AVA 0.67cm2, Dimensional index 0.19. The aortic valve is tricuspid. Aortic  valve regurgitation is not visualized. Severe aortic valve stenosis.   5. There is borderline dilatation of the ascending aorta, measuring 37  mm.   6. Rhythm strip during this exam demonstrates normal sinus rhythm.    Exercise Myoview stress test 08/06/2021: Exercise nuclear stress test was performed using Bruce protocol.  1 Day Rest/Stress Protocol. Exercise  time 4 minutes 30 seconds, achieved 6.43 METS, 86% of age-predicted maximum heart rate. Stress EKG nondiagnostic for ischemia due to baseline ST-T changes. Normal myocardial perfusion without convincing evidence of reversible myocardial ischemia or prior infarct. Left ventricular size normal, wall thickness preserved, calculated LVEF 54%. Low risk study. Past Medical History:  Diagnosis Date   Aortic stenosis    Arthritis    Cancer (HCC)    hx ov colon cancer   Diabetes mellitus without complication (HCC)    GERD (gastroesophageal reflux disease)    Heart murmur    Hyperlipidemia    Hypertension    Stroke (HCC)    mini stroke- no deficits approx 2 years ago   TIA (transient ischemic attack)     Past Surgical History:  Procedure Laterality Date   ABDOMINAL HYSTERECTOMY     CATARACT EXTRACTION Bilateral 11/27/2020   COLON SURGERY     COLONOSCOPY WITH PROPOFOL N/A 05/17/2022   Procedure: COLONOSCOPY WITH PROPOFOL;  Surgeon: Kathi Der, MD;  Location: [facility];  Service: Gastroenterology;  Laterality: N/A;   right knee replacement       MEDICATIONS:  acetaminophen (TYLENOL) 500 MG tablet   atorvastatin (LIPITOR) 10 MG tablet   Carboxymethylcellulose Sodium (THERATEARS OP)   cyanocobalamin (VITAMIN B12) 1000 MCG tablet   DILT-XR 240 MG 24 hr capsule   glimepiride (AMARYL) 1 MG tablet   Lancets (ONETOUCH DELICA PLUS LANCET33G) MISC   lansoprazole (PREVACID) 30 MG capsule   losartan (COZAAR) 50 MG tablet   magic mouthwash (nystatin, hydrocortisone, diphenhydrAMINE) suspension   metoprolol succinate (TOPROL-XL) 25 MG 24 hr tablet   ONETOUCH ULTRA test strip   potassium chloride SA (KLOR-CON) 20 MEQ tablet   No current facility-administered medications for this encounter.    Jodell Cipro Ward, PA-C [facility]Surgical [facility] [phone]

## 2022-09-05 NOTE — Anesthesia Preprocedure Evaluation (Addendum)
Anesthesia Evaluation  Patient identified by MRN, date of birth, ID band Patient awake    Reviewed: Allergy & Precautions, NPO status , Patient's Chart, lab work & pertinent test results, reviewed documented beta blocker date and time   History of Anesthesia Complications Negative for: history of anesthetic complications  Airway Mallampati: II  TM Distance: >3 FB Neck ROM: Full    Dental  (+) Dental Advisory Given, Edentulous Upper   Pulmonary former smoker   Pulmonary exam normal        Cardiovascular hypertension, Pt. on medications and Pt. on home beta blockers + Valvular Problems/Murmurs AS  Rhythm:Regular Rate:Normal + Systolic murmurs  '24 TTE - EF 60 to 65%. Trivial MR. Severe AS. Peak Velocity 4.58m/s, Peak Gradient 79.11mmHG, Mean Gradient 50.65mmHG, AVA 0.67cm2, Dimensional index 0.19. There is borderline dilatation of the ascending aorta, measuring 37 mm.      Neuro/Psych TIACVA, No Residual Symptoms  negative psych ROS   GI/Hepatic Neg liver ROS,GERD  Medicated and Controlled,,  Endo/Other  diabetes, Type 2, Oral Hypoglycemic Agents    Renal/GU negative Renal ROS     Musculoskeletal  (+) Arthritis ,    Abdominal   Peds  Hematology  (+) Blood dyscrasia, anemia  Plt 372k    Anesthesia Other Findings   Reproductive/Obstetrics                              Anesthesia Physical Anesthesia Plan  ASA: 4  Anesthesia Plan: General   Post-op Pain Management: Tylenol PO (pre-op)* and Regional block*   Induction: Intravenous  PONV Risk Score and Plan: 3 and Treatment may vary due to age or medical condition, Ondansetron and Propofol infusion  Airway Management Planned: Oral ETT  Additional Equipment: Arterial line  Intra-op Plan:   Post-operative Plan: Extubation in OR  Informed Consent: I have reviewed the patients History and Physical, chart, labs and discussed the  procedure including the risks, benefits and alternatives for the proposed anesthesia with the patient or authorized representative who has indicated his/her understanding and acceptance.     Dental advisory given  Plan Discussed with: CRNA and Anesthesiologist  Anesthesia Plan Comments: (Lengthy discussion had in preop regarding high risk nature of proceeding given the severity of her aortic stenosis, which included not insignificant risk of death. Also discussed option of postponing surgery until after valve replacement if patient preferred that. Patient eventually decided to proceed with surgery today, understanding her elevated risk.   See PAT note 09/04/2022)        Anesthesia Quick Evaluation

## 2022-09-13 DIAGNOSIS — M25562 Pain in left knee: Secondary | ICD-10-CM | POA: Diagnosis not present

## 2022-09-13 DIAGNOSIS — M25561 Pain in right knee: Secondary | ICD-10-CM | POA: Diagnosis not present

## 2022-09-16 ENCOUNTER — Encounter (HOSPITAL_COMMUNITY): Payer: Self-pay | Admitting: Orthopedic Surgery

## 2022-09-16 ENCOUNTER — Other Ambulatory Visit: Payer: Self-pay

## 2022-09-16 ENCOUNTER — Encounter (HOSPITAL_COMMUNITY)
Admission: RE | Disposition: A | Discharge status: Home Health | Payer: Self-pay | Source: Ambulatory Visit | Attending: Orthopedic Surgery

## 2022-09-16 ENCOUNTER — Observation Stay (HOSPITAL_COMMUNITY)
Admission: RE | Admit: 2022-09-16 | Discharge: 2022-09-17 | Disposition: A | Discharge status: Home Health | Payer: Medicare Other | Source: Ambulatory Visit | Attending: Orthopedic Surgery | Admitting: Orthopedic Surgery

## 2022-09-16 ENCOUNTER — Ambulatory Visit (HOSPITAL_COMMUNITY): Payer: Medicare Other | Admitting: Physician Assistant

## 2022-09-16 ENCOUNTER — Observation Stay (HOSPITAL_COMMUNITY): Payer: Medicare Other

## 2022-09-16 ENCOUNTER — Ambulatory Visit (HOSPITAL_BASED_OUTPATIENT_CLINIC_OR_DEPARTMENT_OTHER): Payer: Medicare Other | Admitting: Physician Assistant

## 2022-09-16 DIAGNOSIS — Z8673 Personal history of transient ischemic attack (TIA), and cerebral infarction without residual deficits: Secondary | ICD-10-CM | POA: Insufficient documentation

## 2022-09-16 DIAGNOSIS — Z91040 Latex allergy status: Secondary | ICD-10-CM | POA: Diagnosis not present

## 2022-09-16 DIAGNOSIS — I350 Nonrheumatic aortic (valve) stenosis: Secondary | ICD-10-CM | POA: Diagnosis not present

## 2022-09-16 DIAGNOSIS — G8929 Other chronic pain: Secondary | ICD-10-CM | POA: Diagnosis present

## 2022-09-16 DIAGNOSIS — E119 Type 2 diabetes mellitus without complications: Secondary | ICD-10-CM | POA: Insufficient documentation

## 2022-09-16 DIAGNOSIS — M1712 Unilateral primary osteoarthritis, left knee: Secondary | ICD-10-CM

## 2022-09-16 DIAGNOSIS — I10 Essential (primary) hypertension: Secondary | ICD-10-CM

## 2022-09-16 DIAGNOSIS — Z01818 Encounter for other preprocedural examination: Secondary | ICD-10-CM

## 2022-09-16 DIAGNOSIS — Z87891 Personal history of nicotine dependence: Secondary | ICD-10-CM | POA: Diagnosis not present

## 2022-09-16 DIAGNOSIS — Z7984 Long term (current) use of oral hypoglycemic drugs: Secondary | ICD-10-CM | POA: Diagnosis not present

## 2022-09-16 DIAGNOSIS — Z471 Aftercare following joint replacement surgery: Secondary | ICD-10-CM | POA: Diagnosis not present

## 2022-09-16 DIAGNOSIS — Z79899 Other long term (current) drug therapy: Secondary | ICD-10-CM | POA: Insufficient documentation

## 2022-09-16 DIAGNOSIS — Z85038 Personal history of other malignant neoplasm of large intestine: Secondary | ICD-10-CM | POA: Diagnosis not present

## 2022-09-16 DIAGNOSIS — G8918 Other acute postprocedural pain: Secondary | ICD-10-CM | POA: Diagnosis not present

## 2022-09-16 DIAGNOSIS — Z96652 Presence of left artificial knee joint: Secondary | ICD-10-CM | POA: Diagnosis not present

## 2022-09-16 HISTORY — PX: TOTAL KNEE ARTHROPLASTY: SHX125

## 2022-09-16 LAB — GLUCOSE, CAPILLARY
Glucose-Capillary: 123 mg/dL — ABNORMAL HIGH (ref 70–99)
Glucose-Capillary: 141 mg/dL — ABNORMAL HIGH (ref 70–99)
Glucose-Capillary: 166 mg/dL — ABNORMAL HIGH (ref 70–99)
Glucose-Capillary: 155 mg/dL — ABNORMAL HIGH (ref 70–99)
Glucose-Capillary: 178 mg/dL — ABNORMAL HIGH (ref 70–99)

## 2022-09-16 SURGERY — ARTHROPLASTY, KNEE, TOTAL
Anesthesia: General | Site: Knee | Laterality: Left

## 2022-09-16 MED ORDER — ACETAMINOPHEN 325 MG PO TABS: 325.0000 mg | ORAL_TABLET | Freq: Four times a day (QID) | ORAL | Status: DC | PRN

## 2022-09-16 MED ORDER — MENTHOL 3 MG MT LOZG: 1.0000 | LOZENGE | OROMUCOSAL | Status: DC | PRN

## 2022-09-16 MED ORDER — FENTANYL CITRATE PF 50 MCG/ML IJ SOSY
PREFILLED_SYRINGE | INTRAMUSCULAR | Status: AC
  Filled 2022-09-16: qty 2

## 2022-09-16 MED ORDER — FENTANYL CITRATE PF 50 MCG/ML IJ SOSY
25.0000 ug | PREFILLED_SYRINGE | INTRAMUSCULAR | Status: DC | PRN
  Administered 2022-09-16: 50 ug via INTRAVENOUS

## 2022-09-16 MED ORDER — ONDANSETRON HCL 4 MG/2ML IJ SOLN
INTRAMUSCULAR | Status: DC | PRN
  Administered 2022-09-16: 4 mg via INTRAVENOUS

## 2022-09-16 MED ORDER — LACTATED RINGERS IV SOLN: INTRAVENOUS | Status: DC

## 2022-09-16 MED ORDER — PANTOPRAZOLE SODIUM 40 MG PO TBEC
40.0000 mg | DELAYED_RELEASE_TABLET | Freq: Every day | ORAL | Status: DC
  Administered 2022-09-16 – 2022-09-17 (×2): 40 mg via ORAL
  Filled 2022-09-16 (×2): qty 1

## 2022-09-16 MED ORDER — LIDOCAINE HCL (PF) 2 % IJ SOLN
INTRAMUSCULAR | Status: AC
  Filled 2022-09-16: qty 5

## 2022-09-16 MED ORDER — ONDANSETRON HCL 4 MG/2ML IJ SOLN: 4.0000 mg | Freq: Once | INTRAMUSCULAR | Status: DC | PRN

## 2022-09-16 MED ORDER — DEXAMETHASONE SODIUM PHOSPHATE 4 MG/ML IJ SOLN
INTRAMUSCULAR | Status: DC | PRN
  Administered 2022-09-16: 4 mg via INTRAVENOUS

## 2022-09-16 MED ORDER — LOSARTAN POTASSIUM 50 MG PO TABS
50.0000 mg | ORAL_TABLET | Freq: Every day | ORAL | Status: DC
  Administered 2022-09-17: 50 mg via ORAL
  Filled 2022-09-16: qty 1

## 2022-09-16 MED ORDER — PHENYLEPHRINE HCL-NACL 20-0.9 MG/250ML-% IV SOLN
INTRAVENOUS | Status: DC | PRN
  Administered 2022-09-16: 25 ug/min via INTRAVENOUS

## 2022-09-16 MED ORDER — TRANEXAMIC ACID-NACL 1000-0.7 MG/100ML-% IV SOLN
1000.0000 mg | INTRAVENOUS | Status: AC
  Administered 2022-09-16: 1000 mg via INTRAVENOUS
  Filled 2022-09-16: qty 100

## 2022-09-16 MED ORDER — ISOPROPYL ALCOHOL 70 % SOLN
Status: DC | PRN
  Administered 2022-09-16: 1 via TOPICAL

## 2022-09-16 MED ORDER — ROPIVACAINE HCL 7.5 MG/ML IJ SOLN
INTRAMUSCULAR | Status: DC | PRN
  Administered 2022-09-16: 20 mL via PERINEURAL

## 2022-09-16 MED ORDER — BUPIVACAINE LIPOSOME 1.3 % IJ SUSP
INTRAMUSCULAR | Status: AC
  Filled 2022-09-16: qty 20

## 2022-09-16 MED ORDER — ASPIRIN 81 MG PO TBEC: 81.0000 mg | DELAYED_RELEASE_TABLET | Freq: Two times a day (BID) | ORAL | 0 refills | Status: AC

## 2022-09-16 MED ORDER — EPHEDRINE 5 MG/ML INJ
INTRAVENOUS | Status: AC
  Filled 2022-09-16: qty 5

## 2022-09-16 MED ORDER — SODIUM CHLORIDE 0.9% FLUSH
INTRAVENOUS | Status: DC | PRN
  Administered 2022-09-16: 60 mL

## 2022-09-16 MED ORDER — ACETAMINOPHEN 500 MG PO TABS
1000.0000 mg | ORAL_TABLET | Freq: Once | ORAL | Status: AC
  Administered 2022-09-16: 1000 mg via ORAL
  Filled 2022-09-16: qty 2

## 2022-09-16 MED ORDER — METOPROLOL SUCCINATE ER 25 MG PO TB24
25.0000 mg | ORAL_TABLET | Freq: Every day | ORAL | Status: DC
  Administered 2022-09-17: 25 mg via ORAL
  Filled 2022-09-16: qty 1

## 2022-09-16 MED ORDER — CEFAZOLIN SODIUM-DEXTROSE 2-4 GM/100ML-% IV SOLN
2.0000 g | Freq: Four times a day (QID) | INTRAVENOUS | Status: AC
  Administered 2022-09-16 (×2): 2 g via INTRAVENOUS
  Filled 2022-09-16 (×2): qty 100

## 2022-09-16 MED ORDER — SODIUM CHLORIDE 0.9 % IV SOLN: INTRAVENOUS | Status: DC

## 2022-09-16 MED ORDER — PHENYLEPHRINE HCL-NACL 20-0.9 MG/250ML-% IV SOLN
INTRAVENOUS | Status: AC
  Filled 2022-09-16: qty 250

## 2022-09-16 MED ORDER — METHOCARBAMOL 500 MG PO TABS
500.0000 mg | ORAL_TABLET | Freq: Four times a day (QID) | ORAL | Status: DC | PRN
  Administered 2022-09-16 – 2022-09-17 (×2): 500 mg via ORAL
  Filled 2022-09-16 (×2): qty 1

## 2022-09-16 MED ORDER — GLIMEPIRIDE 1 MG PO TABS
1.0000 mg | ORAL_TABLET | Freq: Every day | ORAL | Status: DC
  Administered 2022-09-17: 1 mg via ORAL
  Filled 2022-09-16: qty 1

## 2022-09-16 MED ORDER — BUPIVACAINE LIPOSOME 1.3 % IJ SUSP: 20.0000 mL | Freq: Once | INTRAMUSCULAR | Status: DC

## 2022-09-16 MED ORDER — PHENYLEPHRINE 80 MCG/ML (10ML) SYRINGE FOR IV PUSH (FOR BLOOD PRESSURE SUPPORT)
PREFILLED_SYRINGE | INTRAVENOUS | Status: AC
  Filled 2022-09-16: qty 10

## 2022-09-16 MED ORDER — ATORVASTATIN CALCIUM 10 MG PO TABS
10.0000 mg | ORAL_TABLET | Freq: Every day | ORAL | Status: DC
  Administered 2022-09-17: 10 mg via ORAL
  Filled 2022-09-16: qty 1

## 2022-09-16 MED ORDER — CHLORHEXIDINE GLUCONATE 0.12 % MT SOLN
15.0000 mL | Freq: Once | OROMUCOSAL | Status: AC
  Administered 2022-09-16: 15 mL via OROMUCOSAL

## 2022-09-16 MED ORDER — CEFAZOLIN SODIUM-DEXTROSE 2-4 GM/100ML-% IV SOLN
2.0000 g | INTRAVENOUS | Status: AC
  Administered 2022-09-16: 2 g via INTRAVENOUS
  Filled 2022-09-16: qty 100

## 2022-09-16 MED ORDER — SUGAMMADEX SODIUM 200 MG/2ML IV SOLN
INTRAVENOUS | Status: DC | PRN
  Administered 2022-09-16: 150 mg via INTRAVENOUS

## 2022-09-16 MED ORDER — OXYCODONE HCL 5 MG PO TABS
5.0000 mg | ORAL_TABLET | ORAL | Status: DC | PRN
  Administered 2022-09-17: 5 mg via ORAL
  Administered 2022-09-17: 10 mg via ORAL
  Filled 2022-09-16: qty 1
  Filled 2022-09-16: qty 2

## 2022-09-16 MED ORDER — SURGIRINSE WOUND IRRIGATION SYSTEM - OPTIME
TOPICAL | Status: DC | PRN
  Administered 2022-09-16: 450 mL via TOPICAL

## 2022-09-16 MED ORDER — ONDANSETRON HCL 4 MG/2ML IJ SOLN: 4.0000 mg | Freq: Four times a day (QID) | INTRAMUSCULAR | Status: DC | PRN

## 2022-09-16 MED ORDER — HYDROMORPHONE HCL 1 MG/ML IJ SOLN: 0.5000 mg | INTRAMUSCULAR | Status: DC | PRN

## 2022-09-16 MED ORDER — ORAL CARE MOUTH RINSE: 15.0000 mL | Freq: Once | OROMUCOSAL | Status: AC

## 2022-09-16 MED ORDER — DILTIAZEM HCL ER COATED BEADS 240 MG PO CP24
240.0000 mg | ORAL_CAPSULE | Freq: Every day | ORAL | Status: DC
  Administered 2022-09-17: 240 mg via ORAL
  Filled 2022-09-16: qty 1

## 2022-09-16 MED ORDER — ONDANSETRON HCL 4 MG PO TABS: 4.0000 mg | ORAL_TABLET | Freq: Three times a day (TID) | ORAL | 0 refills | Status: AC | PRN

## 2022-09-16 MED ORDER — OXYCODONE HCL 5 MG/5ML PO SOLN: 5.0000 mg | Freq: Once | ORAL | Status: DC | PRN

## 2022-09-16 MED ORDER — FENTANYL CITRATE (PF) 100 MCG/2ML IJ SOLN
INTRAMUSCULAR | Status: AC
  Filled 2022-09-16: qty 2

## 2022-09-16 MED ORDER — ROCURONIUM BROMIDE 10 MG/ML (PF) SYRINGE
PREFILLED_SYRINGE | INTRAVENOUS | Status: DC | PRN
  Administered 2022-09-16: 40 mg via INTRAVENOUS
  Administered 2022-09-16: 10 mg via INTRAVENOUS

## 2022-09-16 MED ORDER — POVIDONE-IODINE 10 % EX SWAB: 2.0000 | Freq: Once | CUTANEOUS | Status: DC

## 2022-09-16 MED ORDER — ACETAMINOPHEN 500 MG PO TABS: 1000.0000 mg | ORAL_TABLET | Freq: Once | ORAL | Status: DC

## 2022-09-16 MED ORDER — PROPOFOL 10 MG/ML IV BOLUS
INTRAVENOUS | Status: DC | PRN
  Administered 2022-09-16: 70 mg via INTRAVENOUS
  Administered 2022-09-16: 10 mg via INTRAVENOUS

## 2022-09-16 MED ORDER — ACETAMINOPHEN 500 MG PO TABS: 1000.0000 mg | ORAL_TABLET | Freq: Three times a day (TID) | ORAL | 0 refills | Status: AC | PRN

## 2022-09-16 MED ORDER — BUPIVACAINE LIPOSOME 1.3 % IJ SUSP
INTRAMUSCULAR | Status: DC | PRN
  Administered 2022-09-16: 20 mL

## 2022-09-16 MED ORDER — SODIUM CHLORIDE (PF) 0.9 % IJ SOLN
INTRAMUSCULAR | Status: AC
  Filled 2022-09-16: qty 10

## 2022-09-16 MED ORDER — 0.9 % SODIUM CHLORIDE (POUR BTL) OPTIME
TOPICAL | Status: DC | PRN
  Administered 2022-09-16: 1000 mL

## 2022-09-16 MED ORDER — SODIUM CHLORIDE 0.9 % IR SOLN
Status: DC | PRN
  Administered 2022-09-16: 3000 mL

## 2022-09-16 MED ORDER — METHOCARBAMOL 500 MG IVPB - SIMPLE MED: 500.0000 mg | Freq: Four times a day (QID) | INTRAVENOUS | Status: DC | PRN

## 2022-09-16 MED ORDER — POLYETHYLENE GLYCOL 3350 17 G PO PACK: 17.0000 g | PACK | Freq: Every day | ORAL | Status: DC | PRN

## 2022-09-16 MED ORDER — ACETAMINOPHEN 500 MG PO TABS
1000.0000 mg | ORAL_TABLET | Freq: Four times a day (QID) | ORAL | Status: AC
  Administered 2022-09-16 – 2022-09-17 (×4): 1000 mg via ORAL
  Filled 2022-09-16 (×4): qty 2

## 2022-09-16 MED ORDER — LIDOCAINE HCL (CARDIAC) PF 100 MG/5ML IV SOSY
PREFILLED_SYRINGE | INTRAVENOUS | Status: DC | PRN
  Administered 2022-09-16: 40 mg via INTRAVENOUS

## 2022-09-16 MED ORDER — ASPIRIN 81 MG PO CHEW
81.0000 mg | CHEWABLE_TABLET | Freq: Two times a day (BID) | ORAL | Status: DC
  Administered 2022-09-16 – 2022-09-17 (×2): 81 mg via ORAL
  Filled 2022-09-16 (×2): qty 1

## 2022-09-16 MED ORDER — PHENOL 1.4 % MT LIQD: 1.0000 | OROMUCOSAL | Status: DC | PRN

## 2022-09-16 MED ORDER — OXYCODONE HCL 5 MG PO TABS: 5.0000 mg | ORAL_TABLET | Freq: Once | ORAL | Status: DC | PRN

## 2022-09-16 MED ORDER — DOCUSATE SODIUM 100 MG PO CAPS
100.0000 mg | ORAL_CAPSULE | Freq: Two times a day (BID) | ORAL | Status: DC
  Administered 2022-09-16 – 2022-09-17 (×2): 100 mg via ORAL
  Filled 2022-09-16 (×2): qty 1

## 2022-09-16 MED ORDER — ONDANSETRON HCL 4 MG PO TABS
4.0000 mg | ORAL_TABLET | Freq: Four times a day (QID) | ORAL | Status: DC | PRN
  Administered 2022-09-17: 4 mg via ORAL
  Filled 2022-09-16: qty 1

## 2022-09-16 MED ORDER — OXYCODONE HCL 5 MG PO TABS: 5.0000 mg | ORAL_TABLET | ORAL | 0 refills | Status: AC | PRN

## 2022-09-16 MED ORDER — FENTANYL CITRATE (PF) 100 MCG/2ML IJ SOLN
INTRAMUSCULAR | Status: DC | PRN
  Administered 2022-09-16: 25 ug via INTRAVENOUS
  Administered 2022-09-16: 50 ug via INTRAVENOUS
  Administered 2022-09-16: 25 ug via INTRAVENOUS

## 2022-09-16 MED ORDER — DEXAMETHASONE SODIUM PHOSPHATE 10 MG/ML IJ SOLN
INTRAMUSCULAR | Status: AC
  Filled 2022-09-16: qty 1

## 2022-09-16 MED ORDER — POTASSIUM CHLORIDE CRYS ER 20 MEQ PO TBCR
20.0000 meq | EXTENDED_RELEASE_TABLET | Freq: Every day | ORAL | Status: DC
  Administered 2022-09-17: 20 meq via ORAL
  Filled 2022-09-16: qty 1

## 2022-09-16 MED ORDER — METHOCARBAMOL 500 MG PO TABS: 500.0000 mg | ORAL_TABLET | Freq: Three times a day (TID) | ORAL | 0 refills | Status: AC | PRN

## 2022-09-16 MED ORDER — EPHEDRINE SULFATE-NACL 50-0.9 MG/10ML-% IV SOSY
PREFILLED_SYRINGE | INTRAVENOUS | Status: DC | PRN
  Administered 2022-09-16 (×3): 5 mg via INTRAVENOUS

## 2022-09-16 MED ORDER — ISOPROPYL ALCOHOL 70 % SOLN
Status: AC
  Filled 2022-09-16: qty 480

## 2022-09-16 MED ORDER — DIPHENHYDRAMINE HCL 12.5 MG/5ML PO ELIX: 12.5000 mg | ORAL_SOLUTION | ORAL | Status: DC | PRN

## 2022-09-16 MED ORDER — SODIUM CHLORIDE (PF) 0.9 % IJ SOLN
INTRAMUSCULAR | Status: AC
  Filled 2022-09-16: qty 50

## 2022-09-16 MED ORDER — INSULIN ASPART 100 UNIT/ML IJ SOLN
0.0000 [IU] | Freq: Three times a day (TID) | INTRAMUSCULAR | Status: DC
  Administered 2022-09-16 – 2022-09-17 (×3): 3 [IU] via SUBCUTANEOUS

## 2022-09-16 MED ORDER — DEXAMETHASONE SODIUM PHOSPHATE 10 MG/ML IJ SOLN: 8.0000 mg | Freq: Once | INTRAMUSCULAR | Status: DC

## 2022-09-16 MED ORDER — ROCURONIUM BROMIDE 10 MG/ML (PF) SYRINGE
PREFILLED_SYRINGE | INTRAVENOUS | Status: AC
  Filled 2022-09-16: qty 10

## 2022-09-16 MED ORDER — ONDANSETRON HCL 4 MG/2ML IJ SOLN
INTRAMUSCULAR | Status: AC
  Filled 2022-09-16: qty 2

## 2022-09-16 SURGICAL SUPPLY — 69 items
ADH SKN CLS APL DERMABOND .7 (GAUZE/BANDAGES/DRESSINGS) ×2
APL PRP STRL LF DISP 70% ISPRP (MISCELLANEOUS) ×3
AUG MED ASF PS 4-5 C-D 10 (Joint) ×1 IMPLANT
AUGMENT MED ASF PS 4-5 C-D 10 (Joint) IMPLANT
BAG COUNTER SPONGE SURGICOUNT (BAG) IMPLANT
BAG SPNG CNTER NS LX DISP (BAG) ×1
BLADE SAG 18X100X1.27 (BLADE) ×1 IMPLANT
BLADE SAW SAG 35X64 .89 (BLADE) ×1 IMPLANT
BNDG CMPR 5X3 CHSV STRCH STRL (GAUZE/BANDAGES/DRESSINGS) ×1
BNDG CMPR MED 10X6 ELC LF (GAUZE/BANDAGES/DRESSINGS) ×1
BNDG COHESIVE 3X5 TAN ST LF (GAUZE/BANDAGES/DRESSINGS) ×1 IMPLANT
BNDG ELASTIC 6X10 VLCR STRL LF (GAUZE/BANDAGES/DRESSINGS) ×1 IMPLANT
BOWL SMART MIX CTS (DISPOSABLE) ×1 IMPLANT
BSPLAT TIB 5D C CMNT STM LT (Knees) ×1 IMPLANT
CEMENT BONE R 1X40 (Cement) IMPLANT
CEMENT BONE REFOBACIN R1X40 US (Cement) IMPLANT
CHLORAPREP W/TINT 26 (MISCELLANEOUS) ×2 IMPLANT
COMP FEM CMT PERS SZ5 LT (Joint) ×1 IMPLANT
COMPONENT FEM CMT PERS SZ5 LT (Joint) IMPLANT
COVER SURGICAL LIGHT HANDLE (MISCELLANEOUS) ×1 IMPLANT
CUFF TOURN SGL QUICK 34 (TOURNIQUET CUFF) ×1
CUFF TRNQT CYL 34X4.125X (TOURNIQUET CUFF) ×1 IMPLANT
DERMABOND ADVANCED .7 DNX12 (GAUZE/BANDAGES/DRESSINGS) ×1 IMPLANT
DRAPE INCISE IOBAN 85X60 (DRAPES) ×1 IMPLANT
DRAPE SHEET LG 3/4 BI-LAMINATE (DRAPES) ×1 IMPLANT
DRAPE U-SHAPE 47X51 STRL (DRAPES) ×1 IMPLANT
DRESSING AQUACEL AG SP 3.5X10 (GAUZE/BANDAGES/DRESSINGS) ×1 IMPLANT
DRSG AQUACEL AG ADV 3.5X10 (GAUZE/BANDAGES/DRESSINGS) IMPLANT
DRSG AQUACEL AG SP 3.5X10 (GAUZE/BANDAGES/DRESSINGS) ×1
ELECT REM PT RETURN 15FT ADLT (MISCELLANEOUS) ×1 IMPLANT
GAUZE SPONGE 4X4 12PLY STRL (GAUZE/BANDAGES/DRESSINGS) ×1 IMPLANT
GLOVE BIO SURGEON STRL SZ 6.5 (GLOVE) ×2 IMPLANT
GLOVE BIOGEL PI IND STRL 6.5 (GLOVE) ×1 IMPLANT
GLOVE BIOGEL PI IND STRL 8 (GLOVE) ×1 IMPLANT
GLOVE SURG ORTHO 8.0 STRL STRW (GLOVE) ×2 IMPLANT
GLOVE SURG SS PI 6.5 STRL IVOR (GLOVE) IMPLANT
GLOVE SURG SS PI 8.0 STRL IVOR (GLOVE) IMPLANT
GOWN STRL REUS W/ TWL XL LVL3 (GOWN DISPOSABLE) ×2 IMPLANT
GOWN STRL REUS W/TWL XL LVL3 (GOWN DISPOSABLE) ×2
HANDPIECE INTERPULSE COAX TIP (DISPOSABLE) ×1
HDLS TROCR DRIL PIN KNEE 75 (PIN) ×1
HOLDER FOLEY CATH W/STRAP (MISCELLANEOUS) ×1 IMPLANT
HOOD PEEL AWAY T7 (MISCELLANEOUS) ×3 IMPLANT
KIT TURNOVER KIT A (KITS) IMPLANT
MANIFOLD NEPTUNE II (INSTRUMENTS) ×1 IMPLANT
MARKER SKIN DUAL TIP RULER LAB (MISCELLANEOUS) ×1 IMPLANT
NS IRRIG 1000ML POUR BTL (IV SOLUTION) ×1 IMPLANT
PACK TOTAL KNEE CUSTOM (KITS) ×1 IMPLANT
PIN DRILL HDLS TROCAR 75 4PK (PIN) IMPLANT
SCREW FEMALE HEX FIX 25X2.5 (ORTHOPEDIC DISPOSABLE SUPPLIES) IMPLANT
SET HNDPC FAN SPRY TIP SCT (DISPOSABLE) ×1 IMPLANT
SOLUTION IRRIG SURGIPHOR (IV SOLUTION) IMPLANT
SPIKE FLUID TRANSFER (MISCELLANEOUS) ×1 IMPLANT
STEM POLY PAT PLY 32M KNEE (Knees) IMPLANT
STEM TIBIA 5 DEG SZ C L KNEE (Knees) IMPLANT
STRIP CLOSURE SKIN 1/2X4 (GAUZE/BANDAGES/DRESSINGS) ×1 IMPLANT
SUT MNCRL AB 3-0 PS2 18 (SUTURE) ×1 IMPLANT
SUT STRATAFIX 0 PDS 27 VIOLET (SUTURE) ×1
SUT STRATAFIX PDO 1 14 VIOLET (SUTURE) ×1
SUT STRATFX PDO 1 14 VIOLET (SUTURE) ×1
SUT VIC AB 2-0 CT2 27 (SUTURE) ×2 IMPLANT
SUTURE STRATFX 0 PDS 27 VIOLET (SUTURE) ×1 IMPLANT
SUTURE STRATFX PDO 1 14 VIOLET (SUTURE) ×1 IMPLANT
SYR 50ML LL SCALE MARK (SYRINGE) ×1 IMPLANT
TIBIA STEM 5 DEG SZ C L KNEE (Knees) ×1 IMPLANT
TRAY FOLEY MTR SLVR 14FR STAT (SET/KITS/TRAYS/PACK) IMPLANT
TUBE SUCTION HIGH CAP CLEAR NV (SUCTIONS) ×1 IMPLANT
UNDERPAD 30X36 HEAVY ABSORB (UNDERPADS AND DIAPERS) ×1 IMPLANT
WRAP KNEE MAXI GEL POST OP (GAUZE/BANDAGES/DRESSINGS) IMPLANT

## 2022-09-16 NOTE — Evaluation (Signed)
Physical Therapy Evaluation Patient Details Name: Jacqueline Rich MRN: [id number] DOB: April 24, 1941 Today's Date: 09/16/2022  History of Present Illness  82 yo female s/p L TKA on 09/16/22. PMH: HTN, DM, aortic valave stenosis  Clinical Impression  Pt is s/p TKA resulting in the deficits listed below (see PT Problem List).  Pt doing well this pm, motivated to work with PT; assisted with SPT to [facility] and amb 40' with min assist. Anticipate steady progress in acute setting   Pt will benefit from acute skilled PT to increase their independence and safety with mobility to allow discharge.         Recommendations for follow up therapy are one component of a multi-disciplinary discharge planning process, led by the attending physician.  Recommendations may be updated based on patient status, additional functional criteria and insurance authorization.  Follow Up Recommendations       Assistance Recommended at Discharge Intermittent Supervision/Assistance  Patient can return home with the following  A little help with walking and/or transfers;A little help with bathing/dressing/bathroom;Assistance with cooking/housework;Assist for transportation;Help with stairs or ramp for entrance    Equipment Recommendations Rolling walker (2 wheels)  Recommendations for Other Services       Functional Status Assessment Patient has had a recent decline in their functional status and demonstrates the ability to make significant improvements in function in a reasonable and predictable amount of time.     Precautions / Restrictions Precautions Precautions: Fall;Knee Restrictions Weight Bearing Restrictions: No Other Position/Activity Restrictions: WBAT      Mobility  Bed Mobility Overal bed mobility: Needs Assistance Bed Mobility: Supine to Sit     Supine to sit: Min guard     General bed mobility comments: for safety    Transfers Overall transfer level: Needs assistance Equipment used:  Rolling walker (2 wheels) Transfers: Sit to/from Stand Sit to Stand: Min guard, Min assist           General transfer comment: assist to rise and stabilize, cues for hand placement and RLE position    Ambulation/Gait Ambulation/Gait assistance: Min assist Gait Distance (Feet): 40 Feet Assistive device: Rolling walker (2 wheels) Gait Pattern/deviations: Step-to pattern       General Gait Details: cues for sequence and RW position  Stairs            Wheelchair Mobility    Modified Rankin (Stroke Patients Only)       Balance                                             Pertinent Vitals/Pain Pain Assessment Pain Assessment: 0-10 Pain Score: 2  Pain Location: left Pain Descriptors / Indicators: Discomfort Pain Intervention(s): Limited activity within patient's tolerance, Monitored during session, Premedicated before session, Repositioned    Home Living Family/patient expects to be discharged to:: Private residence Living Arrangements: Alone Available Help at Discharge: Family Type of Home: House Home Access: Stairs to enter Entrance Stairs-Rails: None Secretary/administrator of Steps: 2   Home Layout: One level Home Equipment: None Additional Comments: sister assisting after surgery    Prior Function Prior Level of Function : Independent/Modified Independent                     Hand Dominance        Extremity/Trunk Assessment   Upper Extremity Assessment Upper Extremity Assessment: Overall  WFL for tasks assessed    Lower Extremity Assessment Lower Extremity Assessment: LLE deficits/detail RLE Deficits / Details: WFL LLE Deficits / Details: ankle WFL, knee and hip grossly 3/5       Communication   Communication: No difficulties  Cognition Arousal/Alertness: Awake/alert Behavior During Therapy: WFL for tasks assessed/performed Overall Cognitive Status: Within Functional Limits for tasks assessed                                           General Comments      Exercises     Assessment/Plan    PT Assessment Patient needs continued PT services  PT Problem List Decreased strength;Decreased range of motion;Decreased activity tolerance;Decreased balance;Decreased mobility;Decreased knowledge of precautions;Pain;Decreased knowledge of use of DME       PT Treatment Interventions DME instruction;Therapeutic exercise;Gait training;Functional mobility training;Therapeutic activities;Patient/family education;Stair training    PT Goals (Current goals can be found in the Care Plan section)  Acute Rehab PT Goals PT Goal Formulation: With patient Time For Goal Achievement: 09/23/22 Potential to Achieve Goals: Good    Frequency 7X/week     Co-evaluation               AM-PAC PT "6 Clicks" Mobility  Outcome Measure Help needed turning from your back to your side while in a flat bed without using bedrails?: A Little Help needed moving from lying on your back to sitting on the side of a flat bed without using bedrails?: A Little Help needed moving to and from a bed to a chair (including a wheelchair)?: A Little Help needed standing up from a chair using your arms (e.g., wheelchair or bedside chair)?: A Little Help needed to walk in hospital room?: A Little Help needed climbing 3-5 steps with a railing? : A Lot 6 Click Score: 17    End of Session Equipment Utilized During Treatment: Gait belt Activity Tolerance: Patient tolerated treatment well Patient left: with call bell/phone within reach;in chair;with chair alarm set;with family/visitor present Nurse Communication: Mobility status PT Visit Diagnosis: Other abnormalities of gait and mobility (R26.89);Difficulty in walking, not elsewhere classified (R26.2)    Time: 1610-9604 PT Time Calculation (min) (ACUTE ONLY): 25 min   Charges:   PT Evaluation $PT Eval Low Complexity: 1 Low PT Treatments $Gait Training: 8-22 mins         Delice Bison, PT  Acute Rehab Dept [facility]) [phone]  09/16/2022   [facility] 09/16/2022, 3:34 PM

## 2022-09-16 NOTE — Transfer of Care (Signed)
Immediate Anesthesia Transfer of Care Note  Patient: Jacqueline Rich  Procedure(s) Performed: TOTAL KNEE ARTHROPLASTY (Left: Knee)  Patient Location: PACU  Anesthesia Type:General and Regional  Level of Consciousness: awake and alert   Airway & Oxygen Therapy: Patient Spontanous Breathing and Patient connected to face mask oxygen  Post-op Assessment: Report given to RN and Post -op Vital signs reviewed and stable  Post vital signs: Reviewed and stable  Last Vitals:  Vitals Value Taken Time  BP 121/71 09/16/22 0951  Temp    Pulse 70 09/16/22 0953  Resp 18 09/16/22 0953  SpO2 90 % 09/16/22 0953  Vitals shown include unvalidated device data.  Last Pain:  Vitals:   09/16/22 0550  TempSrc: Oral         Complications: No notable events documented.

## 2022-09-16 NOTE — Progress Notes (Signed)
Orthopedic Tech Progress Note Patient Details:  Jacqueline Rich [birth year] [id number]  Ortho Devices Type of Ortho Device: Bone foam zero knee Ortho Device/Splint Interventions: Ordered      [place] A Gerilyn Pilgrim 09/16/2022, 10:02 AM

## 2022-09-16 NOTE — Plan of Care (Signed)
  Problem: Education: Goal: Knowledge of the prescribed therapeutic regimen will improve Outcome: Progressing   Problem: Activity: Goal: Range of joint motion will improve Outcome: Progressing   Problem: Pain Management: Goal: Pain level will decrease with appropriate interventions Outcome: Progressing   Problem: Safety: Goal: Ability to remain free from injury will improve Outcome: Progressing   

## 2022-09-16 NOTE — Progress Notes (Signed)
PT Cancellation Note  Patient Details Name: Jacqueline Rich MRN: [id number] DOB: [birth year]   Cancelled Treatment:    Reason Eval/Treat Not Completed: Patient at procedure or test/unavailable. Pt is currently in OR undergoing L TKA. PT to evaluate s/p sx when appropriate.   [name], PT Acute Rehab   [name] 09/16/2022, 9:38 AM

## 2022-09-16 NOTE — Op Note (Signed)
DATE OF SURGERY:  09/16/2022 TIME: 9:04 AM  PATIENT NAME:  Jacqueline Rich   AGE: 82 y.o.    PRE-OPERATIVE DIAGNOSIS: End-stage left knee osteoarthritis  POST-OPERATIVE DIAGNOSIS:  Same  PROCEDURE: Left total Knee Arthroplasty  SURGEON:  Kiani Wurtzel A Prynce Jacober, MD   ASSISTANT: Kathie Dike, PA-C, present and scrubbed throughout the case, critical for assistance with exposure, retraction, instrumentation, and closure.   OPERATIVE IMPLANTS:  Cemented Zimmer persona size left CR narrow femur, C tibial baseplate, 32 mm patella, 10 mm MC poly insert Implant Name Type Inv. Item Serial No. Manufacturer Lot No. LRB No. Used Action  CEMENT BONE REFOBACIN [id number] [place] - [id number] Cement CEMENT BONE REFOBACIN [id number] [place]  ZIMMER RECON(ORTH,TRAU,BIO,SG) [id number] Left 2 Implanted  COMP FEM CMT PERS SZ5 LT - [id number] Joint COMP FEM CMT PERS SZ5 LT  ZIMMER RECON(ORTH,TRAU,BIO,SG) [id number] Left 1 Implanted  TIBIA STEM 5 DEG SZ C L KNEE - [id number] Knees TIBIA STEM 5 DEG SZ C L KNEE  ZIMMER RECON(ORTH,TRAU,BIO,SG) [id number] Left 1 Implanted  STEM POLY PAT PLY 48M KNEE - [id number] Knees STEM POLY PAT PLY 48M KNEE  ZIMMER RECON(ORTH,TRAU,BIO,SG) [id number] Left 1 Implanted  AUG MED ASF PS 4-5 C-D 10 - [id number] Joint AUG MED ASF PS 4-5 C-D 10  ZIMMER RECON(ORTH,TRAU,BIO,SG) [id number] Left 1 Implanted      PREOPERATIVE INDICATIONS:  Jacqueline Rich is a 82 y.o. year old female with end stage bone on bone degenerative arthritis of the knee who failed conservative treatment, including injections, antiinflammatories, activity modification, and assistive devices, and had significant impairment of their activities of daily living, and elected for Total Knee Arthroplasty.   The risks, benefits, and alternatives were discussed at length including but not limited to the risks of infection, bleeding, nerve injury, stiffness, blood clots, the need for revision surgery, cardiopulmonary complications, among  others, and they were willing to proceed.  ESTIMATED BLOOD LOSS: 50cc  OPERATIVE DESCRIPTION:   Once adequate anesthesia was induced, preoperative antibiotics, 2 gm of ancef,1 gm of Tranexamic Acid, and 8 mg of Decadron administered, the patient was positioned supine with a left thigh tourniquet placed.  The left lower extremity was prepped and draped in sterile fashion.  A time-  out was performed identifying the patient, planned procedure, and the appropriate extremity.     The leg was  exsanguinated, tourniquet elevated to 250 mmHg.  A midline incision was  made followed by median parapatellar arthrotomy. Anterior horn of the medial meniscus was released and resected. A medial release was performed, the infrapatellar fat pad was resected with care taken to protect the patellar tendon. The suprapatellar fat was removed to exposed the distal anterior femur. The anterior horn of the lateral meniscus and ACL were released.    Following initial  exposure, I first started with the femur  The femoral  canal was opened with a drill, canal was suctioned to try to prevent fat emboli.  An  intramedullary rod was passed set at 5 degrees valgus, 10 mm. The distal femur was resected.  Following this resection, the tibia was  subluxated anteriorly.  Using the extramedullary guide, 10 mm of bone was resected off   the proximal lateral tibia.  We confirmed the gap would be  stable medially and laterally with a size 10mm spacer block as well as confirmed that the tibial cut was perpendicular in the coronal plane, checking with an alignment rod.    Once this was done, the posterior femoral referencing femoral sizer  was placed under to the posterior condyles with 3 degrees of external rotational which was parallel to the transepicondylar axis and perpendicular to [employer]. The femur was sized to be a size 5 in the anterior-  posterior dimension. The  anterior, posterior, and  chamfer cuts were made without  difficulty nor   notching making certain that I was along the anterior cortex to help  with flexion gap stability. Next a laminar spreader was placed with the knee in flexion and the medial lateral menisci were resected.  5 cc of the Exparel mixture was injected in the medial side of the back of the knee and 3 cc in the lateral side.  1/2 inch curved osteotome was used to resect posterior osteophyte that was then removed with a pituitary rongeur.       At this point, the tibia was sized to be a size C.  The size C tray was  then pinned in position. Trial reduction was now carried with a 5 femur, C tibia, a 10mm MC insert.  The knee had full extension and was stable to varus valgus stress in extension.  The knee was slightly tight in flexion and the PCL was partially released.   Attention was next directed to the patella.  Precut  measurement was noted to be 22 mm.  I resected down to 13 mm and used a  32mm patellar button to restore patellar height as well as cover the cut surface.     The patella lug holes were drilled and a 32mm patella poly trial was placed.    The knee was brought to full extension with good flexion stability with the patella tracking through the trochlea without application of pressure.     Next the femoral component was again assessed and determined to be seated and appropriately lateralized.  The femoral lug holes were drilled.  The femoral component was then removed. Tibial component was again assessed and felt to be seated and appropriately rotated with the medial third of the tubercle. The tibia was then drilled, and keel punched.     Final components were  opened and antibiotic cement was mixed.      Final implants were then  cemented onto cleaned and dried cut surfaces of bone with the knee brought to extension with a 10mm MC poly.  The knee was irrigated with sterile Betadine diluted in saline as well as pulse lavage normal saline.  The synovial lining was  then  injected a dilute Exparel.      Once the cement had fully cured, excess cement was removed throughout the knee.  I confirmed that I was satisfied with the range of motion and stability, and the final 10mm MC poly insert was chosen.  It was placed into the knee.         The tourniquet had been let down at 62 minutes.  No significant hemostasis was required.  The medial parapatellar arthrotomy was then reapproximated using #1 Stratafix sutures with the knee  in flexion.  The remaining wound was closed with 0 stratafix, 2-0 Vicryl, and running 3-0 Monocryl. The knee was cleaned, dried, dressed sterilely using Dermabond and   Aquacel dressing.  The patient was then brought to recovery room in stable condition, tolerating the procedure  well. There were no complications.   Post op recs: WB: WBAT Abx: ancef Imaging: PACU xrays DVT prophylaxis: Aspirin 81mg  BID x4 weeks Follow up: 2 weeks after surgery for a wound check with  Dr. Blanchie Dessert at [facility].  Address: [address], [place], [place] [address]  Office Phone: [phone]  Weber Cooks, MD Orthopaedic Surgery

## 2022-09-16 NOTE — Anesthesia Procedure Notes (Signed)
Arterial Line Insertion Start/End5/20/2024 7:00 AM, 09/16/2022 7:07 AM Performed by: Caren Macadam, CRNA, CRNA  Patient location: Pre-op. Preanesthetic checklist: patient identified, IV checked, site marked, risks and benefits discussed, surgical consent, monitors and equipment checked, pre-op evaluation, timeout performed and anesthesia consent Lidocaine 1% used for infiltration Right, radial was placed Catheter size: 20 G Hand hygiene performed  and maximum sterile barriers used   Attempts: 1 Procedure performed without using ultrasound guided technique. Following insertion, dressing applied and Biopatch. Post procedure assessment: normal  Patient tolerated the procedure well with no immediate complications.

## 2022-09-16 NOTE — Discharge Instructions (Signed)
INSTRUCTIONS AFTER JOINT REPLACEMENT  ° °Remove items at home which could result in a fall. This includes throw rugs or furniture in walking pathways °ICE to the affected joint every three hours while awake for 30 minutes at a time, for at least the first 3-5 days, and then as needed for pain and swelling.  Continue to use ice for pain and swelling. You may notice swelling that will progress down to the foot and ankle.  This is normal after surgery.  Elevate your leg when you are not up walking on it.   °Continue to use the breathing machine you got in the hospital (incentive spirometer) which will help keep your temperature down.  It is common for your temperature to cycle up and down following surgery, especially at night when you are not up moving around and exerting yourself.  The breathing machine keeps your lungs expanded and your temperature down. ° ° °DIET:  As you were doing prior to hospitalization, we recommend a well-balanced diet. ° °DRESSING / WOUND CARE / SHOWERING ° °Keep the surgical dressing until follow up.  The dressing is water proof, so you can shower without any extra covering.  IF THE DRESSING FALLS OFF or the wound gets wet inside, change the dressing with sterile gauze.  Please use good hand washing techniques before changing the dressing.  Do not use any lotions or creams on the incision until instructed by your surgeon.   ° °ACTIVITY ° °Increase activity slowly as tolerated, but follow the weight bearing instructions below.   °No driving for 6 weeks or until further direction given by your physician.  You cannot drive while taking narcotics.  °No lifting or carrying greater than 10 lbs. until further directed by your surgeon. °Avoid periods of inactivity such as sitting longer than an hour when not asleep. This helps prevent blood clots.  °You may return to work once you are authorized by your doctor.  ° ° ° °WEIGHT BEARING  ° °Weight bearing as tolerated with assist device (walker, cane,  etc) as directed, use it as long as suggested by your surgeon or therapist, typically at least 4-6 weeks. ° ° °EXERCISES ° °Results after joint replacement surgery are often greatly improved when you follow the exercise, range of motion and muscle strengthening exercises prescribed by your doctor. Safety measures are also important to protect the joint from further injury. Any time any of these exercises cause you to have increased pain or swelling, decrease what you are doing until you are comfortable again and then slowly increase them. If you have problems or questions, call your caregiver or physical therapist for advice.  ° °Rehabilitation is important following a joint replacement. After just a few days of immobilization, the muscles of the leg can become weakened and shrink (atrophy).  These exercises are designed to build up the tone and strength of the thigh and leg muscles and to improve motion. Often times heat used for twenty to thirty minutes before working out will loosen up your tissues and help with improving the range of motion but do not use heat for the first two weeks following surgery (sometimes heat can increase post-operative swelling).  ° °These exercises can be done on a training (exercise) mat, on the floor, on a table or on a bed. Use whatever works the best and is most comfortable for you.    Use music or television while you are exercising so that the exercises are a pleasant break in your   day. This will make your life better with the exercises acting as a break in your routine that you can look forward to.   Perform all exercises about fifteen times, three times per day or as directed.  You should exercise both the operative leg and the other leg as well. ° °Exercises include: °  °Quad Sets - Tighten up the muscle on the front of the thigh (Quad) and hold for 5-10 seconds.   °Straight Leg Raises - With your knee straight (if you were given a brace, keep it on), lift the leg to 60  degrees, hold for 3 seconds, and slowly lower the leg.  Perform this exercise against resistance later as your leg gets stronger.  °Leg Slides: Lying on your back, slowly slide your foot toward your buttocks, bending your knee up off the floor (only go as far as is comfortable). Then slowly slide your foot back down until your leg is flat on the floor again.  °Angel Wings: Lying on your back spread your legs to the side as far apart as you can without causing discomfort.  °Hamstring Strength:  Lying on your back, push your heel against the floor with your leg straight by tightening up the muscles of your buttocks.  Repeat, but this time bend your knee to a comfortable angle, and push your heel against the floor.  You may put a pillow under the heel to make it more comfortable if necessary.  ° °A rehabilitation program following joint replacement surgery can speed recovery and prevent re-injury in the future due to weakened muscles. Contact your doctor or a physical therapist for more information on knee rehabilitation.  ° ° °CONSTIPATION ° °Constipation is defined medically as fewer than three stools per week and severe constipation as less than one stool per week.  Even if you have a regular bowel pattern at home, your normal regimen is likely to be disrupted due to multiple reasons following surgery.  Combination of anesthesia, postoperative narcotics, change in appetite and fluid intake all can affect your bowels.  ° °YOU MUST use at least one of the following options; they are listed in order of increasing strength to get the job done.  They are all available over the counter, and you may need to use some, POSSIBLY even all of these options:   ° °Drink plenty of fluids (prune juice may be helpful) and high fiber foods °Colace 100 mg by mouth twice a day  °Senokot for constipation as directed and as needed Dulcolax (bisacodyl), take with full glass of water  °Miralax (polyethylene glycol) once or twice a day as  needed. ° °If you have tried all these things and are unable to have a bowel movement in the first 3-4 days after surgery call either your surgeon or your primary doctor.   ° °If you experience loose stools or diarrhea, hold the medications until you stool forms back up.  If your symptoms do not get better within 1 week or if they get worse, check with your doctor.  If you experience "the worst abdominal pain ever" or develop nausea or vomiting, please contact the office immediately for further recommendations for treatment. ° ° °ITCHING:  If you experience itching with your medications, try taking only a single pain pill, or even half a pain pill at a time.  You can also use Benadryl over the counter for itching or also to help with sleep.  ° °TED HOSE STOCKINGS:  Use stockings on both   legs until for at least 2 weeks or as directed by physician office. They may be removed at night for sleeping. ° °MEDICATIONS:  See your medication summary on the “After Visit Summary” that nursing will review with you.  You may have some home medications which will be placed on hold until you complete the course of blood thinner medication.  It is important for you to complete the blood thinner medication as prescribed. ° ° °Blood clot prevention (DVT Prophylaxis): After surgery you are at an increased risk for a blood clot. you were prescribed a blood thinner, Aspirin 81mg, to be taken twice daily for a total of 4 weeks from surgery to help reduce your risk of getting a blood clot. This will help prevent a blood clot. Signs of a pulmonary embolus (blood clot in the lungs) include sudden short of breath, feeling lightheaded or dizzy, chest pain with a deep breath, rapid pulse rapid breathing. Signs of a blood clot in your arms or legs include new unexplained swelling and cramping, warm, red or darkened skin around the painful area. Please call the office or 911 right away if these signs or symptoms develop. ° °PRECAUTIONS:  If you  experience chest pain or shortness of breath - call 911 immediately for transfer to the hospital emergency department.  ° °If you develop a fever greater that 101 F, purulent drainage from wound, increased redness or drainage from wound, foul odor from the wound/dressing, or calf pain - CONTACT YOUR SURGEON.   °                                                °FOLLOW-UP APPOINTMENTS:  If you do not already have a post-op appointment, please call the office for an appointment to be seen by your surgeon.  Guidelines for how soon to be seen are listed in your “After Visit Summary”, but are typically between 2-3 weeks after surgery. ° °OTHER INSTRUCTIONS:  ° °Knee Replacement:  Do not place pillow under knee, focus on keeping the knee straight while resting. CPM instructions: 0-90 degrees, 2 hours in the morning, 2 hours in the afternoon, and 2 hours in the evening. Place foam block, curve side up under heel at all times except when in CPM or when walking.  DO NOT modify, tear, cut, or change the foam block in any way. ° °POST-OPERATIVE OPIOID TAPER INSTRUCTIONS: °It is important to wean off of your opioid medication as soon as possible. If you do not need pain medication after your surgery it is ok to stop day one. °Opioids include: °Codeine, Hydrocodone(Norco, Vicodin), Oxycodone(Percocet, oxycontin) and hydromorphone amongst others.  °Long term and even short term use of opiods can cause: °Increased pain response °Dependence °Constipation °Depression °Respiratory depression °And more.  °Withdrawal symptoms can include °Flu like symptoms °Nausea, vomiting °And more °Techniques to manage these symptoms °Hydrate well °Eat regular healthy meals °Stay active °Use relaxation techniques(deep breathing, meditating, yoga) °Do Not substitute Alcohol to help with tapering °If you have been on opioids for less than two weeks and do not have pain than it is ok to stop all together.  °Plan to wean off of opioids °This plan should  start within one week post op of your joint replacement. °Maintain the same interval or time between taking each dose and first decrease the dose.  °Cut the   total daily intake of opioids by one tablet each day °Next start to increase the time between doses. °The last dose that should be eliminated is the evening dose.  ° °MAKE SURE YOU:  °Understand these instructions.  °Get help right away if you are not doing well or get worse.  ° ° °Thank you for letting us be a part of your medical care team.  It is a privilege we respect greatly.  We hope these instructions will help you stay on track for a fast and full recovery!  ° ° ° °  °  °

## 2022-09-16 NOTE — Interval H&P Note (Signed)

## 2022-09-16 NOTE — Anesthesia Postprocedure Evaluation (Signed)
Anesthesia Post Note  Patient: Jacqueline Rich  Procedure(s) Performed: TOTAL KNEE ARTHROPLASTY (Left: Knee)     Patient location during evaluation: PACU Anesthesia Type: General Level of consciousness: awake and alert Pain management: pain level controlled Vital Signs Assessment: post-procedure vital signs reviewed and stable Respiratory status: spontaneous breathing, nonlabored ventilation, respiratory function stable and patient connected to nasal cannula oxygen Cardiovascular status: blood pressure returned to baseline and stable Postop Assessment: no apparent nausea or vomiting Anesthetic complications: no   No notable events documented.  Last Vitals:  Vitals:   09/16/22 1045 09/16/22 1112  BP: 131/75 125/69  Pulse: 67 (!) 58  Resp: 17 16  Temp: (!) 36.4 C 36.4 C  SpO2: 94% 92%    Last Pain:  Vitals:   09/16/22 1146  TempSrc:   PainSc: 0-No pain                 Beryle Lathe

## 2022-09-16 NOTE — Anesthesia Procedure Notes (Addendum)
Anesthesia Regional Block: Adductor canal block   Pre-Anesthetic Checklist: , timeout performed,  Correct Patient, Correct Site, Correct Laterality,  Correct Procedure, Correct Position, site marked,  Risks and benefits discussed,  Surgical consent,  Pre-op evaluation,  At surgeon's request and post-op pain management  Laterality: Left  Prep: chloraprep       Needles:  Injection technique: Single-shot  Needle Type: Echogenic Needle     Needle Length: 10cm  Needle Gauge: 21     Additional Needles:   Narrative:  Start time: 09/16/2022 7:14 AM End time: 09/16/2022 7:17 AM Injection made incrementally with aspirations every 5 mL.  Performed by: Personally  Anesthesiologist: Beryle Lathe, MD  Additional Notes: No pain on injection. No increased resistance to injection. Injection made in 5cc increments. Good needle visualization. Patient tolerated the procedure well.

## 2022-09-16 NOTE — Anesthesia Procedure Notes (Signed)
Procedure Name: Intubation Date/Time: 09/16/2022 7:34 AM  Performed by: Caren Macadam, CRNAPre-anesthesia Checklist: Patient identified, Emergency Drugs available, Suction available and Patient being monitored Patient Re-evaluated:Patient Re-evaluated prior to induction Oxygen Delivery Method: Circle system utilized Preoxygenation: Pre-oxygenation with 100% oxygen Induction Type: IV induction Ventilation: Mask ventilation without difficulty Laryngoscope Size: Miller and 2 Grade View: Grade I Tube type: Oral Tube size: 7.0 mm Number of attempts: 1 Airway Equipment and Method: Stylet Placement Confirmation: ETT inserted through vocal cords under direct vision, positive ETCO2 and breath sounds checked- equal and bilateral Secured at: 21 cm Tube secured with: Tape Dental Injury: Teeth and Oropharynx as per pre-operative assessment

## 2022-09-17 ENCOUNTER — Encounter (HOSPITAL_COMMUNITY): Payer: Self-pay | Admitting: Orthopedic Surgery

## 2022-09-17 DIAGNOSIS — Z8673 Personal history of transient ischemic attack (TIA), and cerebral infarction without residual deficits: Secondary | ICD-10-CM | POA: Diagnosis not present

## 2022-09-17 DIAGNOSIS — E119 Type 2 diabetes mellitus without complications: Secondary | ICD-10-CM | POA: Diagnosis not present

## 2022-09-17 DIAGNOSIS — Z87891 Personal history of nicotine dependence: Secondary | ICD-10-CM | POA: Diagnosis not present

## 2022-09-17 DIAGNOSIS — Z79899 Other long term (current) drug therapy: Secondary | ICD-10-CM | POA: Diagnosis not present

## 2022-09-17 DIAGNOSIS — Z91040 Latex allergy status: Secondary | ICD-10-CM | POA: Diagnosis not present

## 2022-09-17 DIAGNOSIS — I10 Essential (primary) hypertension: Secondary | ICD-10-CM | POA: Diagnosis not present

## 2022-09-17 DIAGNOSIS — Z7984 Long term (current) use of oral hypoglycemic drugs: Secondary | ICD-10-CM | POA: Diagnosis not present

## 2022-09-17 DIAGNOSIS — M1712 Unilateral primary osteoarthritis, left knee: Secondary | ICD-10-CM | POA: Diagnosis not present

## 2022-09-17 DIAGNOSIS — Z85038 Personal history of other malignant neoplasm of large intestine: Secondary | ICD-10-CM | POA: Diagnosis not present

## 2022-09-17 LAB — CBC
HCT: 28.9 % — ABNORMAL LOW (ref 36.0–46.0)
Hemoglobin: 9.2 g/dL — ABNORMAL LOW (ref 12.0–15.0)
MCH: 26.4 pg (ref 26.0–34.0)
MCHC: 31.8 g/dL (ref 30.0–36.0)
MCV: 82.8 fL (ref 80.0–100.0)
Platelets: 282 10*3/uL (ref 150–400)
RBC: 3.49 MIL/uL — ABNORMAL LOW (ref 3.87–5.11)
RDW: 15.4 % (ref 11.5–15.5)
WBC: 16.3 10*3/uL — ABNORMAL HIGH (ref 4.0–10.5)
nRBC: 0 % (ref 0.0–0.2)

## 2022-09-17 LAB — BASIC METABOLIC PANEL
Anion gap: 9 (ref 5–15)
BUN: 10 mg/dL (ref 8–23)
CO2: 22 mmol/L (ref 22–32)
Calcium: 8.3 mg/dL — ABNORMAL LOW (ref 8.9–10.3)
Chloride: 104 mmol/L (ref 98–111)
Creatinine, Ser: 0.79 mg/dL (ref 0.44–1.00)
GFR, Estimated: >60 mL/min (ref >60)
Glucose, Bld: 127 mg/dL — ABNORMAL HIGH (ref 70–99)
Potassium: 3.8 mmol/L (ref 3.5–5.1)
Sodium: 135 mmol/L (ref 135–145)

## 2022-09-17 LAB — GLUCOSE, CAPILLARY
Glucose-Capillary: 152 mg/dL — ABNORMAL HIGH (ref 70–99)
Glucose-Capillary: 104 mg/dL — ABNORMAL HIGH (ref 70–99)

## 2022-09-17 NOTE — Plan of Care (Signed)
  Problem: Education: Goal: Knowledge of the prescribed therapeutic regimen will improve Outcome: Adequate for Discharge Goal: Individualized Educational Video(s) Outcome: Adequate for Discharge   Problem: Activity: Goal: Ability to avoid complications of mobility impairment will improve 09/17/2022 1432 by Delila Spence, LPN Outcome: Adequate for Discharge 09/17/2022 0849 by Delila Spence, LPN Outcome: Progressing Goal: Range of joint motion will improve Outcome: Adequate for Discharge   Problem: Clinical Measurements: Goal: Postoperative complications will be avoided or minimized Outcome: Adequate for Discharge   Problem: Pain Management: Goal: Pain level will decrease with appropriate interventions 09/17/2022 1432 by Delila Spence, LPN Outcome: Adequate for Discharge 09/17/2022 0849 by Delila Spence, LPN Outcome: Progressing   Problem: Skin Integrity: Goal: Will show signs of wound healing Outcome: Adequate for Discharge   Problem: Education: Goal: Ability to describe self-care measures that may prevent or decrease complications (Diabetes Survival Skills Education) will improve Outcome: Adequate for Discharge Goal: Individualized Educational Video(s) Outcome: Adequate for Discharge   Problem: Coping: Goal: Ability to adjust to condition or change in health will improve Outcome: Adequate for Discharge   Problem: Fluid Volume: Goal: Ability to maintain a balanced intake and output will improve Outcome: Adequate for Discharge   Problem: Health Behavior/Discharge Planning: Goal: Ability to identify and utilize available resources and services will improve Outcome: Adequate for Discharge Goal: Ability to manage health-related needs will improve Outcome: Adequate for Discharge   Problem: Metabolic: Goal: Ability to maintain appropriate glucose levels will improve 09/17/2022 1432 by Delila Spence, LPN Outcome: Adequate for Discharge 09/17/2022 0849 by  Delila Spence, LPN Outcome: Progressing   Problem: Nutritional: Goal: Maintenance of adequate nutrition will improve Outcome: Adequate for Discharge Goal: Progress toward achieving an optimal weight will improve Outcome: Adequate for Discharge   Problem: Skin Integrity: Goal: Risk for impaired skin integrity will decrease Outcome: Adequate for Discharge   Problem: Tissue Perfusion: Goal: Adequacy of tissue perfusion will improve Outcome: Adequate for Discharge   Problem: Education: Goal: Knowledge of General Education information will improve Description: Including pain rating scale, medication(s)/side effects and non-pharmacologic comfort measures Outcome: Adequate for Discharge   Problem: Health Behavior/Discharge Planning: Goal: Ability to manage health-related needs will improve Outcome: Adequate for Discharge   Problem: Clinical Measurements: Goal: Ability to maintain clinical measurements within normal limits will improve Outcome: Adequate for Discharge Goal: Will remain free from infection Outcome: Adequate for Discharge Goal: Diagnostic test results will improve Outcome: Adequate for Discharge Goal: Respiratory complications will improve Outcome: Adequate for Discharge Goal: Cardiovascular complication will be avoided Outcome: Adequate for Discharge   Problem: Activity: Goal: Risk for activity intolerance will decrease Outcome: Adequate for Discharge   Problem: Nutrition: Goal: Adequate nutrition will be maintained Outcome: Adequate for Discharge   Problem: Coping: Goal: Level of anxiety will decrease Outcome: Adequate for Discharge   Problem: Elimination: Goal: Will not experience complications related to bowel motility Outcome: Adequate for Discharge Goal: Will not experience complications related to urinary retention Outcome: Adequate for Discharge   Problem: Pain Managment: Goal: General experience of comfort will improve Outcome: Adequate for  Discharge   Problem: Safety: Goal: Ability to remain free from injury will improve Outcome: Adequate for Discharge   Problem: Skin Integrity: Goal: Risk for impaired skin integrity will decrease Outcome: Adequate for Discharge

## 2022-09-17 NOTE — Progress Notes (Signed)
     Subjective:  Patient reports pain as mild.  Controlled with Tylenol.  Did excellent with physical therapy yesterday plan to possibly discharge home today if can clear physical therapy.  Objective:   VITALS:   Vitals:   09/16/22 1803 09/16/22 2106 09/17/22 0137 09/17/22 0507  BP: 134/64 139/75 (!) 149/76 (!) 153/82  Pulse: 63 72 77 79  Resp: 17 17 18 18   Temp: 97.7 F (36.5 C) 97.9 F (36.6 C) 97.8 F (36.6 C) 98 F (36.7 C)  TempSrc:      SpO2: 96% 97% 98% 96%  Weight:      Height:        Sensation intact distally Intact pulses distally Dorsiflexion/Plantar flexion intact Incision: dressing C/D/I Compartment soft    Lab Results  Component Value Date   WBC 16.3 (H) 09/17/2022   HGB 9.2 (L) 09/17/2022   HCT 28.9 (L) 09/17/2022   MCV 82.8 09/17/2022   PLT 282 09/17/2022   BMET    Component Value Date/Time   NA 135 09/17/2022 0352   NA 140 02/15/2020 0957   K 3.8 09/17/2022 0352   CL 104 09/17/2022 0352   CO2 22 09/17/2022 0352   GLUCOSE 127 (H) 09/17/2022 0352   BUN 10 09/17/2022 0352   BUN 11 02/15/2020 0957   CREATININE 0.79 09/17/2022 0352   CALCIUM 8.3 (L) 09/17/2022 0352   GFRNONAA >60 09/17/2022 0352    Xray: TKA components in good position no adverse features  Assessment/Plan: 1 Day Post-Op   Principal Problem:   Chronic pain of left knee  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 Blanchie Dessert at [facility].  Address: [address], [place], [place] [address]  Office Phone: [phone]   Weber Cooks, MD Orthopaedic Surgery      Jacqueline Rich 09/17/2022, 6:57 AM   Weber Cooks, MD  Contact information:   [phone] 7am-5pm epic message Dr. Blanchie Dessert, or call office for patient follow up: [phone] After hours and holidays please check  [email or web address] for group call information for [facility]

## 2022-09-17 NOTE — Progress Notes (Signed)
PT TX NOTE   09/17/22 1400  PT Visit Information  Last PT Received On 09/17/22  Assistance Needed Pt progressing well, meeting goals. Pt feels prepared to d/c, has family assist as needed (sisters)  History of Present Illness 82 yo female s/p L TKA on 09/16/22. PMH: HTN, DM, aortic valave stenosis  Precautions  Precautions Fall;Knee  Precaution Comments reviewed importance of terminal knee extension  Required Braces or Orthoses Knee Immobilizer - Left  Knee Immobilizer - Left Discontinue once straight leg raise with < 10 degree lag  Restrictions  Weight Bearing Restrictions No  Other Position/Activity Restrictions WBAT  Pain Assessment  Pain Assessment 0-10  Pain Score 2  Pain Location left knee  Pain Descriptors / Indicators Discomfort;Tightness  Pain Intervention(s) Limited activity within patient's tolerance;Monitored during session;Premedicated before session;Repositioned  Cognition  Arousal/Alertness Awake/alert  Behavior During Therapy WFL for tasks assessed/performed  Overall Cognitive Status Within Functional Limits for tasks assessed  Bed Mobility  Bed Mobility Supine to Sit;Sit to Supine  Supine to sit Supervision  Sit to supine Supervision  General bed mobility comments instructed in use of gait belt as leg lifter; cues for technique-no physical assist  Transfers  Overall transfer level Needs assistance  Equipment used Rolling walker (2 wheels)  Transfers Sit to/from Stand  Sit to Stand Supervision;Min guard  General transfer comment cues for hand placement and RLE position  Ambulation/Gait  Ambulation/Gait assistance Supervision  Gait Distance (Feet) 60 Feet  Assistive device Rolling walker (2 wheels)  Gait Pattern/deviations Step-to pattern;Step-through pattern;Decreased stance time - left  General Gait Details cues for sequence and RW position, cues for progression to step through  Stairs Yes  Stairs assistance Min guard  Stair Management No rails;Step to  pattern;Forwards;With walker  Number of Stairs 5 (x2)  General stair comments verbal cues for sequence and technique; no LOB, no knee buckling  PT - End of Session  Equipment Utilized During Treatment Gait belt  Activity Tolerance Patient tolerated treatment well  Patient left with call bell/phone within reach;in bed;with bed alarm set  Nurse Communication Mobility status   PT - Assessment/Plan  PT Plan Current plan remains appropriate  PT Visit Diagnosis Other abnormalities of gait and mobility (R26.89);Difficulty in walking, not elsewhere classified (R26.2)  PT Frequency (ACUTE ONLY) 7X/week  Follow Up Recommendations Follow physician's recommendations for discharge plan and follow up therapies  Assistance recommended at discharge Intermittent Supervision/Assistance  Patient can return home with the following A little help with walking and/or transfers;A little help with bathing/dressing/bathroom;Assistance with cooking/housework;Assist for transportation;Help with stairs or ramp for entrance  PT equipment Rolling walker (2 wheels)  AM-PAC PT "6 Clicks" Mobility Outcome Measure (Version 2)  Help needed turning from your back to your side while in a flat bed without using bedrails? 3  Help needed moving from lying on your back to sitting on the side of a flat bed without using bedrails? 3  Help needed moving to and from a bed to a chair (including a wheelchair)? 3  Help needed standing up from a chair using your arms (e.g., wheelchair or bedside chair)? 3  Help needed to walk in hospital room? 3  Help needed climbing 3-5 steps with a railing?  3  6 Click Score 18  Consider Recommendation of Discharge To: Home with [facility]  PT Goal Progression  Progress towards PT goals Progressing toward goals  Acute Rehab PT Goals  PT Goal Formulation With patient  Time For Goal Achievement 09/23/22  Potential  to Achieve Goals Good  PT Time Calculation  PT Start Time (ACUTE ONLY) 1312  PT Stop Time  (ACUTE ONLY) 1333  PT Time Calculation (min) (ACUTE ONLY) 21 min  PT General Charges  $$ ACUTE PT VISIT 1 Visit  PT Treatments  $Gait Training 8-22 mins

## 2022-09-17 NOTE — TOC Transition Note (Signed)
Transition of Care [facility]) - CM/SW Discharge Note  Patient Details  Name: Jacqueline Rich MRN: [id number] Date of Birth: 05-14-1940  Transition of [facility]) CM/SW Contact:  Ewing Schlein, LCSW Phone Number: 09/17/2022, 10:47 AM  Clinical Narrative: Patient is expected to discharge home after working with PT. CSW met with patient to confirm discharge plan and needs. Patient will go home with HHPT, which was prearranged in orthopedist's office. Patient will need a rolling walker, which [employer] delivered to patient's room. TOC signing off.    Final next level of care: Home w Home Health Services Barriers to Discharge: No Barriers Identified  Patient Goals and CMS Choice CMS Medicare.gov Compare Post Acute Care list provided to:: Patient Choice offered to / list presented to : Patient  Discharge Plan and Services Additional resources added to the After Visit Summary for         DME Arranged: Walker rolling DME Agency: [employer] Representative spoke with at DME Agency: Prearranged in orthopedist's office HH Arranged: PT HH Agency: Optometrist spoke with at [facility] Agency: Prearranged in orthopedist's office  Social Determinants of Health (SDOH) Interventions SDOH Screenings   Food Insecurity: No Food Insecurity (09/16/2022)  Housing: Low Risk  (09/16/2022)  Transportation Needs: No Transportation Needs (09/16/2022)  Utilities: Not At Risk (09/16/2022)  Tobacco Use: Medium Risk (09/16/2022)   Readmission Risk Interventions    05/17/2022    2:02 PM  Readmission Risk Prevention Plan  Post Dischage Appt Complete  Medication Screening Complete  Transportation Screening Complete

## 2022-09-17 NOTE — Progress Notes (Signed)
Physical Therapy Treatment Patient Details Name: Jacqueline Rich MRN: [id number] DOB: [birth year] Today's Date: 09/17/2022   History of Present Illness 82 yo female s/p L TKA on 09/16/22. PMH: HTN, DM, aortic valave stenosis    PT Comments    Pt is progressing toward goals, will see again in pm for stair training and family ed; pt will likely be ready to d/c after pm session    Recommendations for follow up therapy are one component of a multi-disciplinary discharge planning process, led by the attending physician.  Recommendations may be updated based on patient status, additional functional criteria and insurance authorization.  Follow Up Recommendations       Assistance Recommended at Discharge Intermittent Supervision/Assistance  Patient can return home with the following A little help with walking and/or transfers;A little help with bathing/dressing/bathroom;Assistance with cooking/housework;Assist for transportation;Help with stairs or ramp for entrance   Equipment Recommendations  Rolling walker (2 wheels)    Recommendations for Other Services       Precautions / Restrictions Precautions Precautions: Fall;Knee Precaution Comments: reviewed importance of terminal knee extension Required Braces or Orthoses: Knee Immobilizer - Left Knee Immobilizer - Left: Discontinue once straight leg raise with < 10 degree lag Restrictions Weight Bearing Restrictions: No Other Position/Activity Restrictions: WBAT     Mobility  Bed Mobility   Bed Mobility: Supine to Sit     Supine to sit: Supervision     General bed mobility comments: for safety    Transfers Overall transfer level: Needs assistance Equipment used: Rolling walker (2 wheels) Transfers: Sit to/from Stand Sit to Stand: Supervision, Min guard           General transfer comment: cues for hand placement and RLE position    Ambulation/Gait Ambulation/Gait assistance: Min guard Gait Distance (Feet): 80  Feet Assistive device: Rolling walker (2 wheels) Gait Pattern/deviations: Step-to pattern       General Gait Details: cues for sequence and RW position   Stairs             Wheelchair Mobility    Modified Rankin (Stroke Patients Only)       Balance                                            Cognition Arousal/Alertness: Awake/alert Behavior During Therapy: WFL for tasks assessed/performed Overall Cognitive Status: Within Functional Limits for tasks assessed                                          Exercises Total Joint Exercises Ankle Circles/Pumps: AROM, Both, 10 reps Quad Sets: AROM, Both, 10 reps Heel Slides: AAROM, Left, 10 reps Straight Leg Raises: AROM, AAROM, Left, 10 reps    General Comments        Pertinent Vitals/Pain Pain Assessment Pain Assessment: 0-10 Pain Score: 3  Pain Location: left knee Pain Descriptors / Indicators: Discomfort, Tightness Pain Intervention(s): Limited activity within patient's tolerance, Monitored during session, Premedicated before session, Repositioned, Ice applied    Home Living                          Prior Function            PT Goals (current goals can now be  found in the care plan section) Acute Rehab PT Goals PT Goal Formulation: With patient Time For Goal Achievement: 09/23/22 Potential to Achieve Goals: Good Progress towards PT goals: Progressing toward goals    Frequency    7X/week      PT Plan Current plan remains appropriate    Co-evaluation              AM-PAC PT "6 Clicks" Mobility   Outcome Measure  Help needed turning from your back to your side while in a flat bed without using bedrails?: A Little Help needed moving from lying on your back to sitting on the side of a flat bed without using bedrails?: A Little Help needed moving to and from a bed to a chair (including a wheelchair)?: A Little Help needed standing up from a chair  using your arms (e.g., wheelchair or bedside chair)?: A Little Help needed to walk in hospital room?: A Little Help needed climbing 3-5 steps with a railing? : A Little 6 Click Score: 18    End of Session Equipment Utilized During Treatment: Gait belt Activity Tolerance: Patient tolerated treatment well Patient left: with call bell/phone within reach;in chair;with chair alarm set;with family/visitor present Nurse Communication: Mobility status PT Visit Diagnosis: Other abnormalities of gait and mobility (R26.89);Difficulty in walking, not elsewhere classified (R26.2)     Time: 9811-9147 PT Time Calculation (min) (ACUTE ONLY): 26 min  Charges:  $Gait Training: 8-22 mins $Therapeutic Exercise: 8-22 mins                     Delice Bison, PT  Acute Rehab Dept [facility]) [phone]  09/17/2022    [facility] 09/17/2022, 11:58 AM

## 2022-09-17 NOTE — Plan of Care (Signed)
  Problem: Activity: Goal: Ability to avoid complications of mobility impairment will improve Outcome: Progressing   Problem: Pain Management: Goal: Pain level will decrease with appropriate interventions Outcome: Progressing   Problem: Metabolic: Goal: Ability to maintain appropriate glucose levels will improve Outcome: Progressing

## 2022-09-17 NOTE — Discharge Summary (Signed)
Physician Discharge Summary  Patient ID: Jacqueline Rich MRN: [id number] DOB/AGE: [birth year] 82 y.o.  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 female who was admitted 09/16/2022 with a diagnosis of Chronic pain of left knee and went to the operating room on 09/16/2022 and underwent the above named procedures.    She was given perioperative antibiotics:  Anti-infectives (From admission, onward)    Start     Dose/Rate Route Frequency Ordered Stop   09/16/22 1400  ceFAZolin (ANCEF) IVPB 2g/100 mL premix        2 g 200 mL/hr over 30 Minutes Intravenous Every 6 hours 09/16/22 1106 09/16/22 2042   09/16/22 0600  ceFAZolin (ANCEF) IVPB 2g/100 mL premix        2 g 200 mL/hr over 30 Minutes Intravenous On call to O.R. 09/16/22 0865 09/16/22 0744     .  She was given sequential compression devices, early ambulation, and aspirin for DVT prophylaxis.  She benefited maximally from the hospital stay and there were no complications.    Recent vital signs:  Vitals:   09/17/22 0137 09/17/22 0507  BP: (!) 149/76 (!) 153/82  Pulse: 77 79  Resp: 18 18  Temp: 97.8 F (36.6 C) 98 F (36.7 C)  SpO2: 98% 96%    Recent laboratory studies:  Lab Results  Component Value Date   HGB 9.2 (L) 09/17/2022   HGB 10.3 (L) 09/04/2022   HGB 7.6  (L) 05/18/2022   Lab Results  Component Value Date   WBC 16.3 (H) 09/17/2022   PLT 282 09/17/2022   Lab Results  Component Value Date   INR 1.1 05/15/2022   Lab Results  Component Value Date   NA 135 09/17/2022   K 3.8 09/17/2022   CL 104 09/17/2022   CO2 22 09/17/2022   BUN 10 09/17/2022   CREATININE 0.79 09/17/2022   GLUCOSE 127 (H) 09/17/2022    Discharge Medications:   Allergies as of 09/17/2022       Reactions   Latex Other (See Comments)   Made the hands sore when worn   Nsaids Other (See Comments)   Internal bleeding (Meloxicam is suspected as the reason for today's ER visit)        Medication List     STOP taking these medications    magic mouthwash (nystatin, hydrocortisone, diphenhydrAMINE) suspension       TAKE these medications    acetaminophen 500 MG tablet Commonly known as: TYLENOL Take 2 tablets (1,000 mg total) by mouth every 8 (eight) hours as needed. What changed:  when to take this reasons to take this   aspirin EC 81 MG tablet Take 1 tablet (81 mg total) by mouth 2 (two) times daily for 28 days.  Swallow whole.   atorvastatin 10 MG tablet Commonly known as: LIPITOR Take 10 mg by mouth daily.   cyanocobalamin 1000 MCG tablet Commonly known as: VITAMIN B12 Take 1,000 mcg by mouth daily.   Dilt-XR 240 MG 24 hr capsule Generic drug: diltiazem Take 240 mg by mouth daily.   glimepiride 1 MG tablet Commonly known as: AMARYL Take 1 mg by mouth daily.   lansoprazole 30 MG capsule Commonly known as: PREVACID Take 30 mg by mouth daily.   losartan 50 MG tablet Commonly known as: COZAAR Take 50 mg by mouth daily.   methocarbamol 500 MG tablet Commonly known as: ROBAXIN Take 1 tablet (500 mg total) by mouth every 8 (eight) hours as needed for up to 10 days for muscle spasms.   metoprolol succinate 25 MG 24 hr tablet Commonly known as: TOPROL-XL TAKE 1 TABLET(25 MG) BY MOUTH DAILY   ondansetron 4 MG tablet Commonly known as:  Zofran Take 1 tablet (4 mg total) by mouth every 8 (eight) hours as needed for up to 14 days for nausea or vomiting.   OneTouch Delica Plus Lancet33G Misc Apply 1 each topically daily.   OneTouch Ultra test strip Generic drug: glucose blood 1 each daily.   oxyCODONE 5 MG immediate release tablet Commonly known as: Roxicodone Take 1 tablet (5 mg total) by mouth every 4 (four) hours as needed for up to 7 days for severe pain or moderate pain.   potassium chloride SA 20 MEQ tablet Commonly known as: KLOR-CON M Take 20 mEq by mouth daily.   THERATEARS OP Place 1 drop into both eyes 2 (two) times daily as needed (dry eyes).        Diagnostic Studies: DG Knee Left Port  Result Date: 09/16/2022 CLINICAL DATA:  Postop EXAM: PORTABLE LEFT KNEE - 1-2 VIEW COMPARISON:  07/23/2022 FINDINGS: Left knee arthroplasty in satisfactory position. Associated subcutaneous gas. No fracture is seen. IMPRESSION: Left knee arthroplasty in satisfactory position. Electronically Signed   By: Charline Bills M.D.   On: 09/16/2022 10:38    Disposition: Discharge disposition: 01-Home or Self Care       Discharge Instructions     Call MD / Call 911   Complete by: As directed    If you experience chest pain or shortness of breath, CALL 911 and be transported to the hospital emergency room.  If you develope a fever above 101 F, pus (white drainage) or increased drainage or redness at the wound, or calf pain, call your surgeon's office.   Constipation Prevention   Complete by: As directed    Drink plenty of fluids.  Prune juice may be helpful.  You may use a stool softener, such as Colace (over the counter) 100 mg twice a day.  Use MiraLax (over the counter) for constipation as needed.   Diet - low sodium heart healthy   Complete by: As directed    Increase activity slowly as tolerated   Complete by: As directed    Post-operative opioid taper instructions:   Complete by: As directed    POST-OPERATIVE  OPIOID TAPER INSTRUCTIONS: It is important to wean off of your opioid medication as soon as possible. If you do not need pain medication after your surgery it is ok to stop day one. Opioids include: Codeine, Hydrocodone(Norco, Vicodin), Oxycodone(Percocet, oxycontin) and hydromorphone amongst others.  Long term and even short term use of opiods can cause: Increased pain response Dependence Constipation Depression Respiratory depression And more.  Withdrawal symptoms  can include Flu like symptoms Nausea, vomiting And more Techniques to manage these symptoms Hydrate well Eat regular healthy meals Stay active Use relaxation techniques(deep breathing, meditating, yoga) Do Not substitute Alcohol to help with tapering If you have been on opioids for less than two weeks and do not have pain than it is ok to stop all together.  Plan to wean off of opioids This plan should start within one week post op of your joint replacement. Maintain the same interval or time between taking each dose and first decrease the dose.  Cut the total daily intake of opioids by one tablet each day Next start to increase the time between doses. The last dose that should be eliminated is the evening dose.           Follow-up Information     Joen Laura, MD Follow up in 2 week(s).   Specialty: Orthopedic Surgery Contact information: [address] [place] [address] [phone]                    Discharge Instructions      INSTRUCTIONS AFTER JOINT REPLACEMENT   Remove items at home which could result in a fall. This includes throw rugs or furniture in walking pathways ICE to the affected joint every three hours while awake for 30 minutes at a time, for at least the first 3-5 days, and then as needed for pain and swelling.  Continue to use ice for pain and swelling. You may notice swelling that will progress down to the foot and ankle.  This is normal after surgery.   Elevate your leg when you are not up walking on it.   Continue to use the breathing machine you got in the hospital (incentive spirometer) which will help keep your temperature down.  It is common for your temperature to cycle up and down following surgery, especially at night when you are not up moving around and exerting yourself.  The breathing machine keeps your lungs expanded and your temperature down.   DIET:  As you were doing prior to hospitalization, we recommend a well-balanced diet.  DRESSING / WOUND CARE / SHOWERING  Keep the surgical dressing until follow up.  The dressing is water proof, so you can shower without any extra covering.  IF THE DRESSING FALLS OFF or the wound gets wet inside, change the dressing with sterile gauze.  Please use good hand washing techniques before changing the dressing.  Do not use any lotions or creams on the incision until instructed by your surgeon.    ACTIVITY  Increase activity slowly as tolerated, but follow the weight bearing instructions below.   No driving for 6 weeks or until further direction given by your physician.  You cannot drive while taking narcotics.  No lifting or carrying greater than 10 lbs. until further directed by your surgeon. Avoid periods of inactivity such as sitting longer than an hour when not asleep. This helps prevent blood clots.  You may return to work once you are authorized by your doctor.     WEIGHT BEARING   Weight bearing as tolerated with assist device (walker, cane, etc) as directed, use it as long as suggested by your surgeon or therapist, typically at least 4-6 weeks.   EXERCISES  Results after joint replacement surgery are often greatly improved when you follow the exercise, range of motion and muscle strengthening exercises prescribed by your doctor. Safety measures are also important to  protect the joint from further injury. Any time any of these exercises cause you to have increased pain or swelling,  decrease what you are doing until you are comfortable again and then slowly increase them. If you have problems or questions, call your caregiver or physical therapist for advice.   Rehabilitation is important following a joint replacement. After just a few days of immobilization, the muscles of the leg can become weakened and shrink (atrophy).  These exercises are designed to build up the tone and strength of the thigh and leg muscles and to improve motion. Often times heat used for twenty to thirty minutes before working out will loosen up your tissues and help with improving the range of motion but do not use heat for the first two weeks following surgery (sometimes heat can increase post-operative swelling).   These exercises can be done on a training (exercise) mat, on the floor, on a table or on a bed. Use whatever works the best and is most comfortable for you.    Use music or television while you are exercising so that the exercises are a pleasant break in your day. This will make your life better with the exercises acting as a break in your routine that you can look forward to.   Perform all exercises about fifteen times, three times per day or as directed.  You should exercise both the operative leg and the other leg as well.  Exercises include:   Quad Sets - Tighten up the muscle on the front of the thigh (Quad) and hold for 5-10 seconds.   Straight Leg Raises - With your knee straight (if you were given a brace, keep it on), lift the leg to 60 degrees, hold for 3 seconds, and slowly lower the leg.  Perform this exercise against resistance later as your leg gets stronger.  Leg Slides: Lying on your back, slowly slide your foot toward your buttocks, bending your knee up off the floor (only go as far as is comfortable). Then slowly slide your foot back down until your leg is flat on the floor again.  Angel Wings: Lying on your back spread your legs to the side as far apart as you can without  causing discomfort.  Hamstring Strength:  Lying on your back, push your heel against the floor with your leg straight by tightening up the muscles of your buttocks.  Repeat, but this time bend your knee to a comfortable angle, and push your heel against the floor.  You may put a pillow under the heel to make it more comfortable if necessary.   A rehabilitation program following joint replacement surgery can speed recovery and prevent re-injury in the future due to weakened muscles. Contact your doctor or a physical therapist for more information on knee rehabilitation.    CONSTIPATION  Constipation is defined medically as fewer than three stools per week and severe constipation as less than one stool per week.  Even if you have a regular bowel pattern at home, your normal regimen is likely to be disrupted due to multiple reasons following surgery.  Combination of anesthesia, postoperative narcotics, change in appetite and fluid intake all can affect your bowels.   YOU MUST use at least one of the following options; they are listed in order of increasing strength to get the job done.  They are all available over the counter, and you may need to use some, POSSIBLY even all of these options:    Drink plenty  of fluids (prune juice may be helpful) and high fiber foods Colace 100 mg by mouth twice a day  Senokot for constipation as directed and as needed Dulcolax (bisacodyl), take with full glass of water  Miralax (polyethylene glycol) once or twice a day as needed.  If you have tried all these things and are unable to have a bowel movement in the first 3-4 days after surgery call either your surgeon or your primary doctor.    If you experience loose stools or diarrhea, hold the medications until you stool forms back up.  If your symptoms do not get better within 1 week or if they get worse, check with your doctor.  If you experience "the worst abdominal pain ever" or develop nausea or vomiting, please  contact the office immediately for further recommendations for treatment.   ITCHING:  If you experience itching with your medications, try taking only a single pain pill, or even half a pain pill at a time.  You can also use Benadryl over the counter for itching or also to help with sleep.   TED HOSE STOCKINGS:  Use stockings on both legs until for at least 2 weeks or as directed by physician office. They may be removed at night for sleeping.  MEDICATIONS:  See your medication summary on the "After Visit Summary" that nursing will review with you.  You may have some home medications which will be placed on hold until you complete the course of blood thinner medication.  It is important for you to complete the blood thinner medication as prescribed.   Blood clot prevention (DVT Prophylaxis): After surgery you are at an increased risk for a blood clot. you were prescribed a blood thinner, Aspirin 81mg , to be taken twice daily for a total of 4 weeks from surgery to help reduce your risk of getting a blood clot. This will help prevent a blood clot. Signs of a pulmonary embolus (blood clot in the lungs) include sudden short of breath, feeling lightheaded or dizzy, chest pain with a deep breath, rapid pulse rapid breathing. Signs of a blood clot in your arms or legs include new unexplained swelling and cramping, warm, red or darkened skin around the painful area. Please call the office or 911 right away if these signs or symptoms develop.  PRECAUTIONS:  If you experience chest pain or shortness of breath - call 911 immediately for transfer to the hospital emergency department.   If you develop a fever greater that 101 F, purulent drainage from wound, increased redness or drainage from wound, foul odor from the wound/dressing, or calf pain - CONTACT YOUR SURGEON.                                                   FOLLOW-UP APPOINTMENTS:  If you do not already have a post-op appointment, please call the  office for an appointment to be seen by your surgeon.  Guidelines for how soon to be seen are listed in your "After Visit Summary", but are typically between 2-3 weeks after surgery.  OTHER INSTRUCTIONS:   Knee Replacement:  Do not place pillow under knee, focus on keeping the knee straight while resting. CPM instructions: 0-90 degrees, 2 hours in the morning, 2 hours in the afternoon, and 2 hours in the evening. Place foam block, curve side up under  heel at all times except when in CPM or when walking.  DO NOT modify, tear, cut, or change the foam block in any way.  POST-OPERATIVE OPIOID TAPER INSTRUCTIONS: It is important to wean off of your opioid medication as soon as possible. If you do not need pain medication after your surgery it is ok to stop day one. Opioids include: Codeine, Hydrocodone(Norco, Vicodin), Oxycodone(Percocet, oxycontin) and hydromorphone amongst others.  Long term and even short term use of opiods can cause: Increased pain response Dependence Constipation Depression Respiratory depression And more.  Withdrawal symptoms can include Flu like symptoms Nausea, vomiting And more Techniques to manage these symptoms Hydrate well Eat regular healthy meals Stay active Use relaxation techniques(deep breathing, meditating, yoga) Do Not substitute Alcohol to help with tapering If you have been on opioids for less than two weeks and do not have pain than it is ok to stop all together.  Plan to wean off of opioids This plan should start within one week post op of your joint replacement. Maintain the same interval or time between taking each dose and first decrease the dose.  Cut the total daily intake of opioids by one tablet each day Next start to increase the time between doses. The last dose that should be eliminated is the evening dose.   MAKE SURE YOU:  Understand these instructions.  Get help right away if you are not doing well or get worse.    Thank you for  letting us be a part of your medical care team.  It is a privilege we respect greatly.  We hope these instructions will help you stay on track for a fast and full recovery!            Signed: Chezney Huether A Shelisha Gautier 09/17/2022, 6:59 AM

## 2022-09-18 DIAGNOSIS — I350 Nonrheumatic aortic (valve) stenosis: Secondary | ICD-10-CM | POA: Diagnosis not present

## 2022-09-18 DIAGNOSIS — Z96652 Presence of left artificial knee joint: Secondary | ICD-10-CM | POA: Diagnosis not present

## 2022-09-18 DIAGNOSIS — Z7984 Long term (current) use of oral hypoglycemic drugs: Secondary | ICD-10-CM | POA: Diagnosis not present

## 2022-09-18 DIAGNOSIS — G8929 Other chronic pain: Secondary | ICD-10-CM | POA: Diagnosis not present

## 2022-09-18 DIAGNOSIS — I10 Essential (primary) hypertension: Secondary | ICD-10-CM | POA: Diagnosis not present

## 2022-09-18 DIAGNOSIS — K219 Gastro-esophageal reflux disease without esophagitis: Secondary | ICD-10-CM | POA: Diagnosis not present

## 2022-09-18 DIAGNOSIS — E1169 Type 2 diabetes mellitus with other specified complication: Secondary | ICD-10-CM | POA: Diagnosis not present

## 2022-09-18 DIAGNOSIS — Z8673 Personal history of transient ischemic attack (TIA), and cerebral infarction without residual deficits: Secondary | ICD-10-CM | POA: Diagnosis not present

## 2022-09-18 DIAGNOSIS — Z85038 Personal history of other malignant neoplasm of large intestine: Secondary | ICD-10-CM | POA: Diagnosis not present

## 2022-09-18 DIAGNOSIS — Z87891 Personal history of nicotine dependence: Secondary | ICD-10-CM | POA: Diagnosis not present

## 2022-09-18 DIAGNOSIS — E785 Hyperlipidemia, unspecified: Secondary | ICD-10-CM | POA: Diagnosis not present

## 2022-09-18 DIAGNOSIS — Z471 Aftercare following joint replacement surgery: Secondary | ICD-10-CM | POA: Diagnosis not present

## 2022-09-18 DIAGNOSIS — Z9181 History of falling: Secondary | ICD-10-CM | POA: Diagnosis not present

## 2022-09-18 DIAGNOSIS — Z7982 Long term (current) use of aspirin: Secondary | ICD-10-CM | POA: Diagnosis not present

## 2022-09-20 DIAGNOSIS — Z471 Aftercare following joint replacement surgery: Secondary | ICD-10-CM | POA: Diagnosis not present

## 2022-09-20 DIAGNOSIS — Z85038 Personal history of other malignant neoplasm of large intestine: Secondary | ICD-10-CM | POA: Diagnosis not present

## 2022-09-20 DIAGNOSIS — G8929 Other chronic pain: Secondary | ICD-10-CM | POA: Diagnosis not present

## 2022-09-20 DIAGNOSIS — K219 Gastro-esophageal reflux disease without esophagitis: Secondary | ICD-10-CM | POA: Diagnosis not present

## 2022-09-20 DIAGNOSIS — Z9181 History of falling: Secondary | ICD-10-CM | POA: Diagnosis not present

## 2022-09-20 DIAGNOSIS — Z7982 Long term (current) use of aspirin: Secondary | ICD-10-CM | POA: Diagnosis not present

## 2022-09-20 DIAGNOSIS — I350 Nonrheumatic aortic (valve) stenosis: Secondary | ICD-10-CM | POA: Diagnosis not present

## 2022-09-20 DIAGNOSIS — Z7984 Long term (current) use of oral hypoglycemic drugs: Secondary | ICD-10-CM | POA: Diagnosis not present

## 2022-09-20 DIAGNOSIS — Z96652 Presence of left artificial knee joint: Secondary | ICD-10-CM | POA: Diagnosis not present

## 2022-09-20 DIAGNOSIS — Z8673 Personal history of transient ischemic attack (TIA), and cerebral infarction without residual deficits: Secondary | ICD-10-CM | POA: Diagnosis not present

## 2022-09-20 DIAGNOSIS — E1169 Type 2 diabetes mellitus with other specified complication: Secondary | ICD-10-CM | POA: Diagnosis not present

## 2022-09-20 DIAGNOSIS — E785 Hyperlipidemia, unspecified: Secondary | ICD-10-CM | POA: Diagnosis not present

## 2022-09-20 DIAGNOSIS — Z87891 Personal history of nicotine dependence: Secondary | ICD-10-CM | POA: Diagnosis not present

## 2022-09-20 DIAGNOSIS — I10 Essential (primary) hypertension: Secondary | ICD-10-CM | POA: Diagnosis not present

## 2022-09-21 DIAGNOSIS — Z7984 Long term (current) use of oral hypoglycemic drugs: Secondary | ICD-10-CM | POA: Diagnosis not present

## 2022-09-21 DIAGNOSIS — Z7982 Long term (current) use of aspirin: Secondary | ICD-10-CM | POA: Diagnosis not present

## 2022-09-21 DIAGNOSIS — Z471 Aftercare following joint replacement surgery: Secondary | ICD-10-CM | POA: Diagnosis not present

## 2022-09-21 DIAGNOSIS — Z87891 Personal history of nicotine dependence: Secondary | ICD-10-CM | POA: Diagnosis not present

## 2022-09-21 DIAGNOSIS — E1169 Type 2 diabetes mellitus with other specified complication: Secondary | ICD-10-CM | POA: Diagnosis not present

## 2022-09-21 DIAGNOSIS — I350 Nonrheumatic aortic (valve) stenosis: Secondary | ICD-10-CM | POA: Diagnosis not present

## 2022-09-21 DIAGNOSIS — E785 Hyperlipidemia, unspecified: Secondary | ICD-10-CM | POA: Diagnosis not present

## 2022-09-21 DIAGNOSIS — K219 Gastro-esophageal reflux disease without esophagitis: Secondary | ICD-10-CM | POA: Diagnosis not present

## 2022-09-21 DIAGNOSIS — Z85038 Personal history of other malignant neoplasm of large intestine: Secondary | ICD-10-CM | POA: Diagnosis not present

## 2022-09-21 DIAGNOSIS — Z9181 History of falling: Secondary | ICD-10-CM | POA: Diagnosis not present

## 2022-09-21 DIAGNOSIS — G8929 Other chronic pain: Secondary | ICD-10-CM | POA: Diagnosis not present

## 2022-09-21 DIAGNOSIS — I10 Essential (primary) hypertension: Secondary | ICD-10-CM | POA: Diagnosis not present

## 2022-09-21 DIAGNOSIS — Z96652 Presence of left artificial knee joint: Secondary | ICD-10-CM | POA: Diagnosis not present

## 2022-09-21 DIAGNOSIS — Z8673 Personal history of transient ischemic attack (TIA), and cerebral infarction without residual deficits: Secondary | ICD-10-CM | POA: Diagnosis not present

## 2022-09-24 DIAGNOSIS — E1169 Type 2 diabetes mellitus with other specified complication: Secondary | ICD-10-CM | POA: Diagnosis not present

## 2022-09-24 DIAGNOSIS — I350 Nonrheumatic aortic (valve) stenosis: Secondary | ICD-10-CM | POA: Diagnosis not present

## 2022-09-24 DIAGNOSIS — Z96652 Presence of left artificial knee joint: Secondary | ICD-10-CM | POA: Diagnosis not present

## 2022-09-24 DIAGNOSIS — Z471 Aftercare following joint replacement surgery: Secondary | ICD-10-CM | POA: Diagnosis not present

## 2022-09-24 DIAGNOSIS — Z9181 History of falling: Secondary | ICD-10-CM | POA: Diagnosis not present

## 2022-09-24 DIAGNOSIS — Z7984 Long term (current) use of oral hypoglycemic drugs: Secondary | ICD-10-CM | POA: Diagnosis not present

## 2022-09-24 DIAGNOSIS — K219 Gastro-esophageal reflux disease without esophagitis: Secondary | ICD-10-CM | POA: Diagnosis not present

## 2022-09-24 DIAGNOSIS — Z87891 Personal history of nicotine dependence: Secondary | ICD-10-CM | POA: Diagnosis not present

## 2022-09-24 DIAGNOSIS — I10 Essential (primary) hypertension: Secondary | ICD-10-CM | POA: Diagnosis not present

## 2022-09-24 DIAGNOSIS — G8929 Other chronic pain: Secondary | ICD-10-CM | POA: Diagnosis not present

## 2022-09-24 DIAGNOSIS — E785 Hyperlipidemia, unspecified: Secondary | ICD-10-CM | POA: Diagnosis not present

## 2022-09-24 DIAGNOSIS — Z8673 Personal history of transient ischemic attack (TIA), and cerebral infarction without residual deficits: Secondary | ICD-10-CM | POA: Diagnosis not present

## 2022-09-24 DIAGNOSIS — Z85038 Personal history of other malignant neoplasm of large intestine: Secondary | ICD-10-CM | POA: Diagnosis not present

## 2022-09-24 DIAGNOSIS — Z7982 Long term (current) use of aspirin: Secondary | ICD-10-CM | POA: Diagnosis not present

## 2022-09-25 DIAGNOSIS — Z8673 Personal history of transient ischemic attack (TIA), and cerebral infarction without residual deficits: Secondary | ICD-10-CM | POA: Diagnosis not present

## 2022-09-25 DIAGNOSIS — Z9181 History of falling: Secondary | ICD-10-CM | POA: Diagnosis not present

## 2022-09-25 DIAGNOSIS — I350 Nonrheumatic aortic (valve) stenosis: Secondary | ICD-10-CM | POA: Diagnosis not present

## 2022-09-25 DIAGNOSIS — G8929 Other chronic pain: Secondary | ICD-10-CM | POA: Diagnosis not present

## 2022-09-25 DIAGNOSIS — E785 Hyperlipidemia, unspecified: Secondary | ICD-10-CM | POA: Diagnosis not present

## 2022-09-25 DIAGNOSIS — K219 Gastro-esophageal reflux disease without esophagitis: Secondary | ICD-10-CM | POA: Diagnosis not present

## 2022-09-25 DIAGNOSIS — Z87891 Personal history of nicotine dependence: Secondary | ICD-10-CM | POA: Diagnosis not present

## 2022-09-25 DIAGNOSIS — Z7984 Long term (current) use of oral hypoglycemic drugs: Secondary | ICD-10-CM | POA: Diagnosis not present

## 2022-09-25 DIAGNOSIS — E1169 Type 2 diabetes mellitus with other specified complication: Secondary | ICD-10-CM | POA: Diagnosis not present

## 2022-09-25 DIAGNOSIS — I10 Essential (primary) hypertension: Secondary | ICD-10-CM | POA: Diagnosis not present

## 2022-09-25 DIAGNOSIS — Z96652 Presence of left artificial knee joint: Secondary | ICD-10-CM | POA: Diagnosis not present

## 2022-09-25 DIAGNOSIS — Z7982 Long term (current) use of aspirin: Secondary | ICD-10-CM | POA: Diagnosis not present

## 2022-09-25 DIAGNOSIS — Z85038 Personal history of other malignant neoplasm of large intestine: Secondary | ICD-10-CM | POA: Diagnosis not present

## 2022-09-25 DIAGNOSIS — Z471 Aftercare following joint replacement surgery: Secondary | ICD-10-CM | POA: Diagnosis not present

## 2022-09-27 DIAGNOSIS — G8929 Other chronic pain: Secondary | ICD-10-CM | POA: Diagnosis not present

## 2022-09-27 DIAGNOSIS — K219 Gastro-esophageal reflux disease without esophagitis: Secondary | ICD-10-CM | POA: Diagnosis not present

## 2022-09-27 DIAGNOSIS — Z7982 Long term (current) use of aspirin: Secondary | ICD-10-CM | POA: Diagnosis not present

## 2022-09-27 DIAGNOSIS — Z9181 History of falling: Secondary | ICD-10-CM | POA: Diagnosis not present

## 2022-09-27 DIAGNOSIS — Z85038 Personal history of other malignant neoplasm of large intestine: Secondary | ICD-10-CM | POA: Diagnosis not present

## 2022-09-27 DIAGNOSIS — Z96652 Presence of left artificial knee joint: Secondary | ICD-10-CM | POA: Diagnosis not present

## 2022-09-27 DIAGNOSIS — Z87891 Personal history of nicotine dependence: Secondary | ICD-10-CM | POA: Diagnosis not present

## 2022-09-27 DIAGNOSIS — Z471 Aftercare following joint replacement surgery: Secondary | ICD-10-CM | POA: Diagnosis not present

## 2022-09-27 DIAGNOSIS — Z7984 Long term (current) use of oral hypoglycemic drugs: Secondary | ICD-10-CM | POA: Diagnosis not present

## 2022-09-27 DIAGNOSIS — E1169 Type 2 diabetes mellitus with other specified complication: Secondary | ICD-10-CM | POA: Diagnosis not present

## 2022-09-27 DIAGNOSIS — I350 Nonrheumatic aortic (valve) stenosis: Secondary | ICD-10-CM | POA: Diagnosis not present

## 2022-09-27 DIAGNOSIS — Z8673 Personal history of transient ischemic attack (TIA), and cerebral infarction without residual deficits: Secondary | ICD-10-CM | POA: Diagnosis not present

## 2022-09-27 DIAGNOSIS — E785 Hyperlipidemia, unspecified: Secondary | ICD-10-CM | POA: Diagnosis not present

## 2022-09-27 DIAGNOSIS — I10 Essential (primary) hypertension: Secondary | ICD-10-CM | POA: Diagnosis not present

## 2022-09-30 DIAGNOSIS — Z9181 History of falling: Secondary | ICD-10-CM | POA: Diagnosis not present

## 2022-09-30 DIAGNOSIS — I10 Essential (primary) hypertension: Secondary | ICD-10-CM | POA: Diagnosis not present

## 2022-09-30 DIAGNOSIS — I350 Nonrheumatic aortic (valve) stenosis: Secondary | ICD-10-CM | POA: Diagnosis not present

## 2022-09-30 DIAGNOSIS — Z96652 Presence of left artificial knee joint: Secondary | ICD-10-CM | POA: Diagnosis not present

## 2022-09-30 DIAGNOSIS — Z7984 Long term (current) use of oral hypoglycemic drugs: Secondary | ICD-10-CM | POA: Diagnosis not present

## 2022-09-30 DIAGNOSIS — K219 Gastro-esophageal reflux disease without esophagitis: Secondary | ICD-10-CM | POA: Diagnosis not present

## 2022-09-30 DIAGNOSIS — E1169 Type 2 diabetes mellitus with other specified complication: Secondary | ICD-10-CM | POA: Diagnosis not present

## 2022-09-30 DIAGNOSIS — Z471 Aftercare following joint replacement surgery: Secondary | ICD-10-CM | POA: Diagnosis not present

## 2022-09-30 DIAGNOSIS — G8929 Other chronic pain: Secondary | ICD-10-CM | POA: Diagnosis not present

## 2022-09-30 DIAGNOSIS — Z85038 Personal history of other malignant neoplasm of large intestine: Secondary | ICD-10-CM | POA: Diagnosis not present

## 2022-09-30 DIAGNOSIS — Z87891 Personal history of nicotine dependence: Secondary | ICD-10-CM | POA: Diagnosis not present

## 2022-09-30 DIAGNOSIS — Z7982 Long term (current) use of aspirin: Secondary | ICD-10-CM | POA: Diagnosis not present

## 2022-09-30 DIAGNOSIS — E785 Hyperlipidemia, unspecified: Secondary | ICD-10-CM | POA: Diagnosis not present

## 2022-09-30 DIAGNOSIS — Z8673 Personal history of transient ischemic attack (TIA), and cerebral infarction without residual deficits: Secondary | ICD-10-CM | POA: Diagnosis not present

## 2022-10-01 DIAGNOSIS — M1712 Unilateral primary osteoarthritis, left knee: Secondary | ICD-10-CM | POA: Diagnosis not present

## 2022-10-02 DIAGNOSIS — M1712 Unilateral primary osteoarthritis, left knee: Secondary | ICD-10-CM | POA: Diagnosis not present

## 2022-10-02 DIAGNOSIS — M6281 Muscle weakness (generalized): Secondary | ICD-10-CM | POA: Diagnosis not present

## 2022-10-02 DIAGNOSIS — R262 Difficulty in walking, not elsewhere classified: Secondary | ICD-10-CM | POA: Diagnosis not present

## 2022-10-02 DIAGNOSIS — M25662 Stiffness of left knee, not elsewhere classified: Secondary | ICD-10-CM | POA: Diagnosis not present

## 2022-10-08 DIAGNOSIS — M6281 Muscle weakness (generalized): Secondary | ICD-10-CM | POA: Diagnosis not present

## 2022-10-08 DIAGNOSIS — M1712 Unilateral primary osteoarthritis, left knee: Secondary | ICD-10-CM | POA: Diagnosis not present

## 2022-10-08 DIAGNOSIS — M25662 Stiffness of left knee, not elsewhere classified: Secondary | ICD-10-CM | POA: Diagnosis not present

## 2022-10-08 DIAGNOSIS — R262 Difficulty in walking, not elsewhere classified: Secondary | ICD-10-CM | POA: Diagnosis not present

## 2022-10-10 DIAGNOSIS — R262 Difficulty in walking, not elsewhere classified: Secondary | ICD-10-CM | POA: Diagnosis not present

## 2022-10-10 DIAGNOSIS — M1712 Unilateral primary osteoarthritis, left knee: Secondary | ICD-10-CM | POA: Diagnosis not present

## 2022-10-10 DIAGNOSIS — M6281 Muscle weakness (generalized): Secondary | ICD-10-CM | POA: Diagnosis not present

## 2022-10-10 DIAGNOSIS — M25662 Stiffness of left knee, not elsewhere classified: Secondary | ICD-10-CM | POA: Diagnosis not present

## 2022-10-15 DIAGNOSIS — R262 Difficulty in walking, not elsewhere classified: Secondary | ICD-10-CM | POA: Diagnosis not present

## 2022-10-15 DIAGNOSIS — M6281 Muscle weakness (generalized): Secondary | ICD-10-CM | POA: Diagnosis not present

## 2022-10-15 DIAGNOSIS — M1712 Unilateral primary osteoarthritis, left knee: Secondary | ICD-10-CM | POA: Diagnosis not present

## 2022-10-15 DIAGNOSIS — M25662 Stiffness of left knee, not elsewhere classified: Secondary | ICD-10-CM | POA: Diagnosis not present

## 2022-10-17 DIAGNOSIS — M1712 Unilateral primary osteoarthritis, left knee: Secondary | ICD-10-CM | POA: Diagnosis not present

## 2022-10-17 DIAGNOSIS — M25662 Stiffness of left knee, not elsewhere classified: Secondary | ICD-10-CM | POA: Diagnosis not present

## 2022-10-17 DIAGNOSIS — M6281 Muscle weakness (generalized): Secondary | ICD-10-CM | POA: Diagnosis not present

## 2022-10-17 DIAGNOSIS — R262 Difficulty in walking, not elsewhere classified: Secondary | ICD-10-CM | POA: Diagnosis not present

## 2022-10-22 DIAGNOSIS — M1712 Unilateral primary osteoarthritis, left knee: Secondary | ICD-10-CM | POA: Diagnosis not present

## 2022-10-22 DIAGNOSIS — R262 Difficulty in walking, not elsewhere classified: Secondary | ICD-10-CM | POA: Diagnosis not present

## 2022-10-22 DIAGNOSIS — M25662 Stiffness of left knee, not elsewhere classified: Secondary | ICD-10-CM | POA: Diagnosis not present

## 2022-10-22 DIAGNOSIS — M6281 Muscle weakness (generalized): Secondary | ICD-10-CM | POA: Diagnosis not present

## 2022-10-24 DIAGNOSIS — M1712 Unilateral primary osteoarthritis, left knee: Secondary | ICD-10-CM | POA: Diagnosis not present

## 2022-10-24 DIAGNOSIS — M6281 Muscle weakness (generalized): Secondary | ICD-10-CM | POA: Diagnosis not present

## 2022-10-24 DIAGNOSIS — M25662 Stiffness of left knee, not elsewhere classified: Secondary | ICD-10-CM | POA: Diagnosis not present

## 2022-10-24 DIAGNOSIS — R262 Difficulty in walking, not elsewhere classified: Secondary | ICD-10-CM | POA: Diagnosis not present

## 2022-10-29 DIAGNOSIS — M1712 Unilateral primary osteoarthritis, left knee: Secondary | ICD-10-CM | POA: Diagnosis not present

## 2022-10-29 DIAGNOSIS — R262 Difficulty in walking, not elsewhere classified: Secondary | ICD-10-CM | POA: Diagnosis not present

## 2022-10-29 DIAGNOSIS — M6281 Muscle weakness (generalized): Secondary | ICD-10-CM | POA: Diagnosis not present

## 2022-10-29 DIAGNOSIS — M25662 Stiffness of left knee, not elsewhere classified: Secondary | ICD-10-CM | POA: Diagnosis not present

## 2022-11-04 DIAGNOSIS — M6281 Muscle weakness (generalized): Secondary | ICD-10-CM | POA: Diagnosis not present

## 2022-11-04 DIAGNOSIS — M1712 Unilateral primary osteoarthritis, left knee: Secondary | ICD-10-CM | POA: Diagnosis not present

## 2022-11-04 DIAGNOSIS — M25662 Stiffness of left knee, not elsewhere classified: Secondary | ICD-10-CM | POA: Diagnosis not present

## 2022-11-04 DIAGNOSIS — R262 Difficulty in walking, not elsewhere classified: Secondary | ICD-10-CM | POA: Diagnosis not present

## 2022-11-05 DIAGNOSIS — M1712 Unilateral primary osteoarthritis, left knee: Secondary | ICD-10-CM | POA: Diagnosis not present

## 2022-11-07 DIAGNOSIS — K080 Exfoliation of teeth due to systemic causes: Secondary | ICD-10-CM | POA: Diagnosis not present

## 2022-11-14 DIAGNOSIS — M1712 Unilateral primary osteoarthritis, left knee: Secondary | ICD-10-CM | POA: Diagnosis not present

## 2022-11-14 DIAGNOSIS — M6281 Muscle weakness (generalized): Secondary | ICD-10-CM | POA: Diagnosis not present

## 2022-11-14 DIAGNOSIS — R262 Difficulty in walking, not elsewhere classified: Secondary | ICD-10-CM | POA: Diagnosis not present

## 2022-11-14 DIAGNOSIS — M25662 Stiffness of left knee, not elsewhere classified: Secondary | ICD-10-CM | POA: Diagnosis not present

## 2022-11-22 DIAGNOSIS — M1712 Unilateral primary osteoarthritis, left knee: Secondary | ICD-10-CM | POA: Diagnosis not present

## 2022-11-22 DIAGNOSIS — R262 Difficulty in walking, not elsewhere classified: Secondary | ICD-10-CM | POA: Diagnosis not present

## 2022-11-22 DIAGNOSIS — M25662 Stiffness of left knee, not elsewhere classified: Secondary | ICD-10-CM | POA: Diagnosis not present

## 2022-11-22 DIAGNOSIS — M6281 Muscle weakness (generalized): Secondary | ICD-10-CM | POA: Diagnosis not present

## 2022-12-04 DIAGNOSIS — I700 Atherosclerosis of aorta: Secondary | ICD-10-CM | POA: Diagnosis not present

## 2022-12-17 DIAGNOSIS — M1712 Unilateral primary osteoarthritis, left knee: Secondary | ICD-10-CM | POA: Diagnosis not present

## 2022-12-24 DIAGNOSIS — K080 Exfoliation of teeth due to systemic causes: Secondary | ICD-10-CM | POA: Diagnosis not present

## 2023-01-15 ENCOUNTER — Ambulatory Visit (HOSPITAL_COMMUNITY)
Admission: RE | Admit: 2023-01-15 | Discharge: 2023-01-15 | Disposition: A | Discharge status: Home / Self Care | Payer: Medicare Other | Source: Ambulatory Visit | Attending: Cardiology | Admitting: Cardiology

## 2023-01-15 DIAGNOSIS — I10 Essential (primary) hypertension: Secondary | ICD-10-CM | POA: Diagnosis not present

## 2023-01-15 DIAGNOSIS — E785 Hyperlipidemia, unspecified: Secondary | ICD-10-CM | POA: Insufficient documentation

## 2023-01-15 DIAGNOSIS — I350 Nonrheumatic aortic (valve) stenosis: Secondary | ICD-10-CM | POA: Diagnosis not present

## 2023-01-15 DIAGNOSIS — E119 Type 2 diabetes mellitus without complications: Secondary | ICD-10-CM | POA: Diagnosis not present

## 2023-01-16 ENCOUNTER — Ambulatory Visit: Payer: Medicare Other | Admitting: Cardiology

## 2023-01-20 LAB — ECHOCARDIOGRAM COMPLETE
AR max vel: 0.68 cm2
AV Area VTI: 0.73 cm2
AV Area mean vel: 0.72 cm2
AV Mean grad: 63 mmHg
AV Peak grad: 108.6 mmHg
Ao pk vel: 5.21 m/s
Area-P 1/2: 2.75 cm2
S' Lateral: 2.6 cm

## 2023-01-28 ENCOUNTER — Encounter: Payer: Self-pay | Admitting: Cardiology

## 2023-01-28 ENCOUNTER — Ambulatory Visit: Payer: Medicare Other | Attending: Cardiology | Admitting: Cardiology

## 2023-01-28 VITALS — BP 120/78 | HR 69 | Resp 16 | Ht 64.0 in | Wt 161.4 lb

## 2023-01-28 DIAGNOSIS — I10 Essential (primary) hypertension: Secondary | ICD-10-CM | POA: Diagnosis not present

## 2023-01-28 DIAGNOSIS — E782 Mixed hyperlipidemia: Secondary | ICD-10-CM

## 2023-01-28 DIAGNOSIS — I350 Nonrheumatic aortic (valve) stenosis: Secondary | ICD-10-CM

## 2023-01-28 DIAGNOSIS — E119 Type 2 diabetes mellitus without complications: Secondary | ICD-10-CM

## 2023-01-28 DIAGNOSIS — Z87891 Personal history of nicotine dependence: Secondary | ICD-10-CM

## 2023-01-28 NOTE — Patient Instructions (Signed)
Medication Instructions:  Your physician recommends that you continue on your current medications as directed. Please refer to the Current Medication list given to you today.  *If you need a refill on your cardiac medications before your next appointment, please call your pharmacy*   Lab Work: Please complete a CBC and BMET in our lab before you leave today.  If you have labs (blood work) drawn today and your tests are completely normal, you will receive your results only by: MyChart Message (if you have MyChart) OR A paper copy in the mail If you have any lab test that is abnormal or we need to change your treatment, we will call you to review the results.   Testing/Procedures: Your physician has requested that you have a cardiac catheterization prior to your evaluation for TAVR. Cardiac catheterization is used to diagnose and/or treat various heart conditions. Doctors may recommend this procedure for a number of different reasons. The most common reason is to evaluate chest pain. Chest pain can be a symptom of coronary artery disease (CAD), and cardiac catheterization can show whether plaque is narrowing or blocking your heart's arteries. This procedure is also used to evaluate the valves, as well as measure the blood flow and oxygen levels in different parts of your heart. For further information please visit [email or web address] Please follow instruction sheet, as given.        Cardiac/Peripheral Catheterization   You are scheduled for a Cardiac Catheterization on Friday, October 11 with Dr. Verne Carrow.  1. Please arrive at the [facility] (Main Entrance A) at [facility]: [address] [place], [place] [address] at 7:00 AM (This time is 2 hour(s) before your procedure to ensure your preparation). Free valet parking service is available. You will check in at ADMITTING. The support person will be asked to wait in the waiting room.  It is OK to have someone drop you  off and come back when you are ready to be discharged.        Special note: Every effort is made to have your procedure done on time. Please understand that emergencies sometimes delay scheduled procedures.  2. Diet: Do not eat solid foods after midnight.  You may have clear liquids until 5 AM the day of the procedure.  3. Labs: You will need to have blood drawn today, 10/1/24Tuesday,  You do not need to be fasting.  4. Medication instructions in preparation for your procedure:   Contrast Allergy: No    Stop taking, Cozaar (Losartan) Thursday, October 10,  Do not take your Glimeperide the day of the procedure 02/07/23   On the morning of your procedure, take Aspirin 81 mg and any morning medicines NOT listed above.  You may use sips of water.  5. Plan to go home the same day, you will only stay overnight if medically necessary. 6. You MUST have a responsible adult to drive you home. 7. An adult MUST be with you the first 24 hours after you arrive home. 8. Bring a current list of your medications, and the last time and date medication taken. 9. Bring ID and current insurance cards. 10.Please wear clothes that are easy to get on and off and wear slip-on shoes.  Thank you for allowing [place] to care for you!   -- [facility]     Follow-Up: At [facility], you and your health needs are our priority.  As part of our continuing mission to provide you  with exceptional heart care, we have created designated Provider Care Teams.  These Care Teams include your primary Cardiologist (physician) and Advanced Practice Providers (APPs -  Physician Assistants and Nurse Practitioners) who all work together to provide you with the care you need, when you need it.  We recommend signing up for the patient portal called "MyChart".  Sign up information is provided on this After Visit Summary.  MyChart is used to connect with patients for Virtual Visits  (Telemedicine).  Patients are able to view lab/test results, encounter notes, upcoming appointments, etc.  Non-urgent messages can be sent to your provider as well.   To learn more about what you can do with MyChart, go to [email or web address].    Your next appointment will be dependent on the outcome of your TAVR evaluation and it will be with:     Provider:   Dr. [name]   Other Instructions You have been referred to our structural heart team for a TAVR evaluation. Someone from our structural heart team will call you to set up this appointment. You will need to complete your cardiac catheterization prior to seeing our structural heart physician.

## 2023-01-28 NOTE — Progress Notes (Signed)
Cardiology Office Note:  .   Date:  01/28/2023  ID:  Jacqueline Rich, DOB 05-08-1940, MRN [id number] PCP:  [name], MD  Former Cardiology Providers: Dr. Sol Blazing [facility], [place])  [facility] Providers Cardiologist:  [name], DO , [facility] (established care 02/08/2020) Electrophysiologist:  None  Click to update primary MD,subspecialty MD or APP then REFRESH:1}    History of Present Illness: .   Jacqueline Rich is a 82 y.o. African-American female whose past medical history and cardiovascular risk factors includes: Aortic stenosis, hypertension, hyper lipidemia, diabetes mellitus type 2 without insulin, former smoker, TIA.  Patient is being followed at the practice for severe aortic stenosis.  Since she has remained asymptomatic she has been followed clinically and with serial echocardiogram.  She presents today for follow-up after recent echo.  Clinically she denies any anginal chest pain, heart failure symptoms, or syncope.  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  Overall functional capacity remains stable.  Review of Systems: .   Review of Systems  Cardiovascular:  Negative for chest pain, claudication, dyspnea on exertion, irregular heartbeat, leg swelling, near-syncope, orthopnea, palpitations, paroxysmal nocturnal dyspnea and syncope.  Respiratory:  Negative for shortness of breath.   Hematologic/Lymphatic: Negative for bleeding problem.  Musculoskeletal:  Negative for muscle cramps and myalgias.  Neurological:  Negative for dizziness and light-headedness.    Studies Reviewed:   EKG: EKG: 07/15/2022: Sinus rhythm, 80 bpm, normal axis, nonspecific T wave abnormality.  Echocardiogram: Echo 01/15/2023 1. Left ventricular ejection fraction, by estimation, is 60 to 65%. The left ventricle has normal function. The left ventricle has no regional wall motion abnormalities. Left ventricular diastolic parameters  are indeterminate. Elevated left atrial  pressure. The E/e' is 22. 2. Right ventricular systolic function is normal. The right ventricular size is normal. There is normal pulmonary artery systolic pressure. The estimated right ventricular systolic pressure is 29.4 mmHg. 3. The mitral valve is degenerative. Trivial mitral valve regurgitation. No evidence of mitral stenosis. 4. Native valve, calcified trileaflet, severe / critical aortic stenosis (max velocity noted at RSB peak velocity 5.69m/s, peak gradient , mean gradient , AVA VTI 0.73cm2, DI 0.21).. Aortic valve regurgitation is not visualized. 5. The inferior vena cava is normal in size with greater than 50% respiratory variability, suggesting right atrial pressure of 3 mmHg. 6. Rhythm strip during this exam demonstrates normal sinus rhythm.  Comparison(s): Prior study 07/04/2022: LVEF 60-65%, no RWMA, elevated LVEDP, severe AS (peak velocity 4.15m/s, PG 79.73mmHG, MG , AVA 0.67cm2, DI 0.19).   Stress Testing: Exercise Myoview stress test 08/06/2021: Exercise nuclear stress test was performed using Bruce protocol.  1 Day Rest/Stress Protocol. Exercise time 4 minutes 30 seconds, achieved 6.43 METS, 86% of age-predicted maximum heart rate. Stress EKG nondiagnostic for ischemia due to baseline ST-T changes. Normal myocardial perfusion without convincing evidence of reversible myocardial ischemia or prior infarct. Left ventricular size normal, wall thickness preserved, calculated LVEF 54%. Low risk study.   Heart Catheterization: None   Carotid duplex: 05/05/2019 (external): No hemodynamically significant stenosis or aberrant flow on either side, normal antegrade flow bilaterally.  Risk Assessment/Calculations:   NA    Labs:       Latest Ref Rng & Units 09/17/2022    3:52 AM 09/04/2022    2:44 PM 05/18/2022   12:46 PM  CBC  WBC 4.0 - 10.5 K/uL 16.3  11.8    Hemoglobin 12.0 - 15.0 g/dL 9.2  01.0  7.6   Hematocrit  36.0 - 46.0 % 28.9  32.8  23.7   Platelets 150 - 400 K/uL 282  372         Latest Ref Rng & Units 09/17/2022    3:52 AM 09/04/2022    2:44 PM 05/18/2022    7:30 AM  BMP  Glucose 70 - 99 mg/dL 161  096  045   BUN 8 - 23 mg/dL 10  14  19    Creatinine 0.44 - 1.00 mg/dL 4.09  8.11  9.14   Sodium 135 - 145 mmol/L 135  136  137   Potassium 3.5 - 5.1 mmol/L 3.8  3.4  3.6   Chloride 98 - 111 mmol/L 104  105  106   CO2 22 - 32 mmol/L 22  22  26    Calcium 8.9 - 10.3 mg/dL 8.3  9.4  8.5       Latest Ref Rng & Units 09/17/2022    3:52 AM 09/04/2022    2:44 PM 05/18/2022    7:30 AM  CMP  Glucose 70 - 99 mg/dL 782  956  213   BUN 8 - 23 mg/dL 10  14  19    Creatinine 0.44 - 1.00 mg/dL 0.86  5.78  4.69   Sodium 135 - 145 mmol/L 135  136  137   Potassium 3.5 - 5.1 mmol/L 3.8  3.4  3.6   Chloride 98 - 111 mmol/L 104  105  106   CO2 22 - 32 mmol/L 22  22  26    Calcium 8.9 - 10.3 mg/dL 8.3  9.4  8.5   Total Protein 6.5 - 8.1 g/dL  8.4  6.9   Total Bilirubin 0.3 - 1.2 mg/dL  0.4  0.3   Alkaline Phos 38 - 126 U/L  67  60   AST 15 - 41 U/L  16  19   ALT 0 - 44 U/L  13  13     Lab Results  Component Value Date   CHOL 132 05/17/2022   HDL 35 (L) 05/17/2022   LDLCALC 71 05/17/2022   TRIG 129 05/17/2022   CHOLHDL 3.8 05/17/2022   No results for input(s): "LIPOA" in the last 8760 hours. No components found for: "NTPROBNP" No results for input(s): "PROBNP" in the last 8760 hours. No results for input(s): "TSH" in the last 8760 hours.  Physical Exam:    Today's Vitals   01/28/23 0948  BP: 120/78  Pulse: 69  Resp: 16  SpO2: 96%  Weight: 161 lb 6.4 oz (73.2 kg)  Height: 5\' 4"  (1.626 m)   Body mass index is 27.7 kg/m. Wt Readings from Last 3 Encounters:  01/28/23 161 lb 6.4 oz (73.2 kg)  09/16/22 168 lb (76.2 kg)  09/04/22 168 lb (76.2 kg)    Physical Exam  Constitutional: No distress.  Age appropriate, hemodynamically stable.   Neck: No JVD present.  Cardiovascular: Normal rate,  regular rhythm, S1 normal, S2 normal, intact distal pulses and normal pulses. Exam reveals no gallop, no S3 and no S4.  Murmur heard. Midsystolic murmur is present with a grade of 4/6 at the upper right sternal border radiating to the neck. Pulmonary/Chest: Effort normal and breath sounds normal. No stridor. She has no wheezes. She has no rales.  Abdominal: Soft. Bowel sounds are normal. She exhibits no distension. There is no abdominal tenderness.  Musculoskeletal:        General: No edema.     Cervical back: Neck supple.  Neurological: She  is alert and oriented to person, place, and time. She has intact cranial nerves (2-12).  Skin: Skin is warm and moist.     Impression & Recommendation(s):  Impression:   ICD-10-CM   1. Nonrheumatic aortic valve stenosis  I35.0 EKG 12-Lead    Ambulatory referral to Structural Heart/Valve Clinic (only at [facility])    CBC    Basic metabolic panel    2. Benign hypertension  I10 CBC    Basic metabolic panel    3. Type 2 diabetes mellitus without complication, without long-term current use of insulin (HCC)  E11.9 CBC    Basic metabolic panel    4. Mixed hyperlipidemia  E78.2 CBC    Basic metabolic panel    5. Former smoker  Z87.891 CBC    Basic metabolic panel       Recommendation(s):  Nonrheumatic aortic valve stenosis Clinically denies anginal chest pain, syncope, or heart failure symptoms. Patient was being followed clinically with serial echocardiogram and symptoms. Most recent echocardiogram notes critical aortic stenosis with hemodynamic parameters as discussed above. Recommend aortic valve replacement. Will refer to structural heart team for TAVR evaluation but prior to that we will need left heart catheterization to evaluate for CAD. If after the heart catheterization it is deemed that she needs bypass surgery as well then the TAVR evaluation will need to be changed to CT surgery consult. Will await the results of left heart  catheterization. Check BMP and CBC as part of his preoperative labs  Benign hypertension Office blood pressures are well-controlled. Continue losartan, Toprol-XL  Type 2 diabetes mellitus without complication, without long-term current use of insulin (HCC) Patient states that sugars are well-controlled. Currently managed by primary care provider. Currently on ARB, statin therapy  Mixed hyperlipidemia Currently on atorvastatin.   She denies myalgia or other side effects. Currently managed by primary care provider.  Orders Placed:  Orders Placed This Encounter  Procedures   CBC   Basic metabolic panel   Ambulatory referral to Structural Heart/Valve Clinic (only at [facility])    Referral Priority:   Routine    Referral Type:   Consultation    Referral Reason:   Specialty Services Required    Referred to Provider:   Kathleene Hazel, MD    Requested Specialty:   Cardiology    Number of Visits Requested:   1   EKG 12-Lead   As part of medical decision making results of the echocardiogram were reviewed independently at today's visit.   Final Medication List:   No orders of the defined types were placed in this encounter.   Medications Discontinued During This Encounter  Medication Reason   Carboxymethylcellulose Sodium (THERATEARS OP)      Current Outpatient Medications:    atorvastatin (LIPITOR) 10 MG tablet, Take 10 mg by mouth in the morning., Disp: , Rfl:    cyanocobalamin (VITAMIN B12) 1000 MCG tablet, Take 1,000 mcg by mouth in the morning., Disp: , Rfl:    DILT-XR 240 MG 24 hr capsule, Take 240 mg by mouth in the morning., Disp: , Rfl:    glimepiride (AMARYL) 1 MG tablet, Take 1 mg by mouth in the morning., Disp: , Rfl:    Lancets (ONETOUCH DELICA PLUS LANCET33G) MISC, Apply 1 each topically daily., Disp: , Rfl:    lansoprazole (PREVACID) 30 MG capsule, Take 30 mg by mouth daily before breakfast., Disp: , Rfl:    losartan (COZAAR) 50 MG tablet, Take 50 mg by  mouth daily.,  Disp: , Rfl:    metoprolol succinate (TOPROL-XL) 25 MG 24 hr tablet, TAKE 1 TABLET(25 MG) BY MOUTH DAILY, Disp: 90 tablet, Rfl: 3   ONETOUCH ULTRA test strip, 1 each daily., Disp: , Rfl:    potassium chloride SA (KLOR-CON) 20 MEQ tablet, Take 20 mEq by mouth in the morning., Disp: , Rfl:    amoxicillin (AMOXIL) 500 MG tablet, Take 2,000 mg by mouth See admin instructions. Take 4 tablets (2000 mg) by mouth 1 hour prior to dental appointments., Disp: , Rfl:    Carboxymethylcellulose Sod PF (THERATEARS PF) 0.25 % SOLN, Place 1-2 drops into both eyes 3 (three) times daily as needed (dry/irritated eyes.)., Disp: , Rfl:    magic mouthwash (nystatin, hydrocortisone, diphenhydrAMINE) suspension, Swish and spit 5 mLs daily as needed (oral irritation.)., Disp: , Rfl:    sodium chloride (OCEAN) 0.65 % SOLN nasal spray, Place 1 spray into both nostrils as needed for congestion., Disp: , Rfl:   Consent:      Informed Consent   Shared Decision Making/Informed Consent The risks [stroke (1 in 1000), death (1 in 1000), kidney failure [usually temporary] (1 in 500), bleeding (1 in 200), allergic reaction [possibly serious] (1 in 200)], benefits (diagnostic support and management of coronary artery disease) and alternatives of a cardiac catheterization were discussed in detail with Ms. Blakeley and she is willing to proceed.      Disposition:   Return in about 3 months (around 04/30/2023) for Status post aortic valve replacement. or sooner if needed.  Her questions and concerns were addressed to her satisfaction. She voices understanding of the recommendations provided during this encounter.    Signed, [name], DO, [facility]  [facility]  [address] [place], [place] [address] [phone] 01/28/2023 1:16 PM

## 2023-01-29 LAB — BASIC METABOLIC PANEL
BUN/Creatinine Ratio: 17 (ref 12–28)
BUN: 12 mg/dL (ref 8–27)
CO2: 23 mmol/L (ref 20–29)
Calcium: 9.6 mg/dL (ref 8.7–10.3)
Chloride: 103 mmol/L (ref 96–106)
Creatinine, Ser: 0.72 mg/dL (ref 0.57–1.00)
Glucose: 93 mg/dL (ref 70–99)
Potassium: 4.2 mmol/L (ref 3.5–5.2)
Sodium: 137 mmol/L (ref 134–144)
eGFR: 84 mL/min/{1.73_m2} (ref >59)

## 2023-01-29 LAB — CBC
Hematocrit: 33.5 % — ABNORMAL LOW (ref 34.0–46.6)
Hemoglobin: 10.6 g/dL — ABNORMAL LOW (ref 11.1–15.9)
MCH: 27 pg (ref 26.6–33.0)
MCHC: 31.6 g/dL (ref 31.5–35.7)
MCV: 85 fL (ref 79–97)
Platelets: 354 10*3/uL (ref 150–450)
RBC: 3.93 x10E6/uL (ref 3.77–5.28)
RDW: 14.4 % (ref 11.7–15.4)
WBC: 8.9 10*3/uL (ref 3.4–10.8)

## 2023-02-03 ENCOUNTER — Encounter: Payer: Self-pay | Admitting: Cardiovascular Disease

## 2023-02-03 ENCOUNTER — Institutional Professional Consult (permissible substitution): Payer: Medicare Other | Admitting: Cardiovascular Disease

## 2023-02-03 ENCOUNTER — Other Ambulatory Visit: Payer: Self-pay

## 2023-02-03 ENCOUNTER — Ambulatory Visit: Payer: Medicare Other | Attending: Cardiovascular Disease | Admitting: Cardiovascular Disease

## 2023-02-03 VITALS — BP 142/70 | HR 76 | Ht 64.0 in | Wt 163.6 lb

## 2023-02-03 DIAGNOSIS — I350 Nonrheumatic aortic (valve) stenosis: Secondary | ICD-10-CM | POA: Diagnosis not present

## 2023-02-03 NOTE — Patient Instructions (Signed)
Medication Instructions:  No changes *If you need a refill on your cardiac medications before your next appointment, please call your pharmacy*   Lab Work: none   Testing/Procedures: Plan for heart catheterization as scheduled.    Follow-Up: Per Structural Heart Plan

## 2023-02-03 NOTE — Progress Notes (Signed)
[facility] Consult Note  Chief Complaint  Patient presents with   New Patient (Initial Visit)    Aortic stenosis   History of Present Illness: 82 yo female with history of HTN, hyperlipidemia, Diabetes, former tobacco abuse, prior TIA and severe aortic stenosis here today as a new consult, referred by Dr. Odis Hollingshead, for further discussion regarding her aortic stenosis and possible TAVR. She is followed by Dr. Odis Hollingshead. Echo 01/15/23 with LVEF=60-65%. Normal RV function. Critical aortic stenosis with mean gradient 63 mmHg, peak gradient 108 mmHg, AVA 0.73 cm2, DI 0.21.   She tells me today that she feels well overall but has some fatigue. She has lower extremity edema. She has no chest pain or pressure. No dyspnea. She lives in [place], [place]. She is retired from Warehouse manager work. She has upper dentures. She has no active dental issues.   Primary Care Physician: [name], MD Primary Cardiologist: Odis Hollingshead Referring Cardiologist: Odis Hollingshead  Past Medical History:  Diagnosis Date   Aortic stenosis    Arthritis    Cancer (HCC)    hx ov colon cancer   Diabetes mellitus without complication (HCC)    GERD (gastroesophageal reflux disease)    Heart murmur    Hyperlipidemia    Hypertension    Stroke (HCC)    mini stroke- no deficits approx 2 years ago   TIA (transient ischemic attack)     Past Surgical History:  Procedure Laterality Date   ABDOMINAL HYSTERECTOMY     CATARACT EXTRACTION Bilateral 11/27/2020   COLON SURGERY     COLONOSCOPY WITH PROPOFOL N/A 05/17/2022   Procedure: COLONOSCOPY WITH PROPOFOL;  Surgeon: Kathi Der, MD;  Location: [facility];  Service: Gastroenterology;  Laterality: N/A;   right knee replacement      TOTAL KNEE ARTHROPLASTY Left 09/16/2022   Procedure: TOTAL KNEE ARTHROPLASTY;  Surgeon: Joen Laura, MD;  Location: [facility];  Service: Orthopedics;  Laterality: Left;   TOTAL KNEE ARTHROPLASTY Right     Current Outpatient  Medications  Medication Sig Dispense Refill   amoxicillin (AMOXIL) 500 MG tablet Take 2,000 mg by mouth See admin instructions. Take 4 tablets (2000 mg) by mouth 1 hour prior to dental appointments.     atorvastatin (LIPITOR) 10 MG tablet Take 10 mg by mouth in the morning.     Carboxymethylcellulose Sod PF (THERATEARS PF) 0.25 % SOLN Place 1-2 drops into both eyes 3 (three) times daily as needed (dry/irritated eyes.).     cyanocobalamin (VITAMIN B12) 1000 MCG tablet Take 1,000 mcg by mouth in the morning.     DILT-XR 240 MG 24 hr capsule Take 240 mg by mouth in the morning.     glimepiride (AMARYL) 1 MG tablet Take 1 mg by mouth in the morning.     Lancets (ONETOUCH DELICA PLUS LANCET33G) MISC Apply 1 each topically daily.     lansoprazole (PREVACID) 30 MG capsule Take 30 mg by mouth daily before breakfast.     losartan (COZAAR) 50 MG tablet Take 50 mg by mouth daily.     magic mouthwash (nystatin, hydrocortisone, diphenhydrAMINE) suspension Swish and spit 5 mLs daily as needed (oral irritation.).     metoprolol succinate (TOPROL-XL) 25 MG 24 hr tablet TAKE 1 TABLET(25 MG) BY MOUTH DAILY 90 tablet 3   ONETOUCH ULTRA test strip 1 each daily.     potassium chloride SA (KLOR-CON) 20 MEQ tablet Take 20 mEq by mouth in the morning.     sodium chloride (OCEAN) 0.65 %  SOLN nasal spray Place 1 spray into both nostrils as needed for congestion.     No current facility-administered medications for this visit.    Allergies  Allergen Reactions   Latex Other (See Comments)    Made the hands sore when worn   Nsaids Other (See Comments)    Internal bleeding (Meloxicam is suspected as the reason for today's ER visit)    Social History   Socioeconomic History   Marital status: Single    Spouse name: Not on file   Number of children: 0   Years of education: Not on file   Highest education level: Not on file  Occupational History   Occupation: Retired-Clerical work  Tobacco Use   Smoking status:  Former    Current packs/day: 0.00    Average packs/day: 1 pack/day for 7.0 years (7.0 ttl pk-yrs)    Types: Cigarettes    Start date: 12    Quit date: 1990    Years since quitting: 34.7   Smokeless tobacco: Never  Vaping Use   Vaping status: Never Used  Substance and Sexual Activity   Alcohol use: Yes    Alcohol/week: 2.0 standard drinks of alcohol    Types: 1 Glasses of wine, 1 Cans of beer per week    Comment: occasionally   Drug use: Never   Sexual activity: Not on file  Other Topics Concern   Not on file  Social History Narrative   Not on file   Social Determinants of Health   Financial Resource Strain: Not on file  Food Insecurity: No Food Insecurity (09/16/2022)   Hunger Vital Sign    Worried About Running Out of Food in the Last Year: Never true    Ran Out of Food in the Last Year: Never true  Transportation Needs: No Transportation Needs (09/16/2022)   PRAPARE - Administrator, Civil Service (Medical): No    Lack of Transportation (Non-Medical): No  Physical Activity: Not on file  Stress: Not on file  Social Connections: Not on file  Intimate Partner Violence: Not At Risk (09/16/2022)   Humiliation, Afraid, Rape, and Kick questionnaire    Fear of Current or Ex-Partner: No    Emotionally Abused: No    Physically Abused: No    Sexually Abused: No    Family History  Problem Relation Age of Onset   Cancer - Colon Mother    Hypertension Father    Cancer - Lung Brother    Hypertension Sister    Cancer Brother    Heart attack Brother    Hypertension Brother     Review of Systems:  As stated in the HPI and otherwise negative.   BP (!) 142/70   Pulse 76   Ht 5\' 4"  (1.626 m)   Wt 74.2 kg   SpO2 96%   BMI 28.08 kg/m   Physical Examination: General: Well developed, well nourished, NAD  HEENT: OP clear, mucus membranes moist  SKIN: warm, dry. No rashes. Neuro: No focal deficits  Musculoskeletal: Muscle strength 5/5 all ext  Psychiatric: Mood  and affect normal  Neck: No JVD, no carotid bruits, no thyromegaly, no lymphadenopathy.  Lungs:Clear bilaterally, no wheezes, rhonci, crackles Cardiovascular: Regular rate and rhythm. Loud, harsh, late peaking systolic murmur.  Abdomen:Soft. Bowel sounds present. Non-tender.  Extremities: No lower extremity edema. Pulses are 2 + in the bilateral DP/PT.  EKG:  EKG  ordered today. The ekg ordered today demonstrates   Echo 01/15/23:  1. Left  ventricular ejection fraction, by estimation, is 60 to 65%. The  left ventricle has normal function. The left ventricle has no regional  wall motion abnormalities. Left ventricular diastolic parameters are  indeterminate. Elevated left atrial  pressure. The E/e' is 22.   2. Right ventricular systolic function is normal. The right ventricular  size is normal. There is normal pulmonary artery systolic pressure. The  estimated right ventricular systolic pressure is 29.4 mmHg.   3. The mitral valve is degenerative. Trivial mitral valve regurgitation.  No evidence of mitral stenosis.   4. Native valve, calcified trileaflet, severe / critical aortic stenosis  (max velocity noted at RSB peak velocity 5.68m/s, peak gradient ,  mean gradient , AVA VTI 0.73cm2, DI 0.21).. Aortic valve  regurgitation is not visualized.   5. The inferior vena cava is normal in size with greater than 50%  respiratory variability, suggesting right atrial pressure of 3 mmHg.   6. Rhythm strip during this exam demonstrates normal sinus rhythm.   Comparison(s): Prior study 07/04/2022: LVEF 60-65%, no RWMA, elevated LVEDP,  severe AS (peak velocity 4.76m/s, PG 79.25mmHG, MG , AVA 0.67cm2, DI  0.19).   FINDINGS   Left Ventricle: Left ventricular ejection fraction, by estimation, is 60  to 65%. The left ventricle has normal function. The left ventricle has no  regional wall motion abnormalities. The left ventricular internal cavity  size was normal in size. There is    no left ventricular hypertrophy. Left ventricular diastolic parameters  are indeterminate. Elevated left atrial pressure. The E/e' is 22.   Right Ventricle: The right ventricular size is normal. No increase in  right ventricular wall thickness. Right ventricular systolic function is  normal. There is normal pulmonary artery systolic pressure. The tricuspid  regurgitant velocity is 2.57 m/s, and   with an assumed right atrial pressure of 3 mmHg, the estimated right  ventricular systolic pressure is 29.4 mmHg.   Left Atrium: Left atrial size was normal in size.   Right Atrium: Right atrial size was normal in size.   Pericardium: There is no evidence of pericardial effusion.   Mitral Valve: The mitral valve is degenerative in appearance. Mild to  moderate mitral annular calcification. Trivial mitral valve regurgitation.  No evidence of mitral valve stenosis.   Tricuspid Valve: The tricuspid valve is grossly normal. Tricuspid valve  regurgitation is trivial. No evidence of tricuspid stenosis.   Aortic Valve: Native valve, calcified trileaflet, severe / critical aortic  stenosis (max velocity noted at RSB peak velocity 5.36m/s, peak gradient  , mean gradient , AVA VTI 0.73cm2, DI 0.21). Aortic valve  regurgitation is not visualized.  Aortic valve mean gradient measures 63.0 mmHg. Aortic valve peak gradient  measures 108.6 mmHg. Aortic valve area, by VTI measures 0.73 cm.   Pulmonic Valve: The pulmonic valve was normal in structure. Pulmonic valve  regurgitation is trivial. No evidence of pulmonic stenosis.   Aorta: The aortic root and ascending aorta are structurally normal, with  no evidence of dilitation.   Venous: The inferior vena cava is normal in size with greater than 50%  respiratory variability, suggesting right atrial pressure of 3 mmHg.   IAS/Shunts: The atrial septum is grossly normal.   EKG: Rhythm strip during this exam demonstrates normal sinus  rhythm.     LEFT VENTRICLE  PLAX 2D  LVIDd:         3.90 cm   Diastology  LVIDs:         2.60 cm  LV e' medial:    4.24 cm/s  LV PW:         1.00 cm   LV E/e' medial:  24.3  LV IVS:        1.00 cm   LV e' lateral:   5.22 cm/s  LVOT diam:     2.10 cm   LV E/e' lateral: 19.7  LV SV:         90  LV SV Index:   50  LVOT Area:     3.46 cm     RIGHT VENTRICLE  RV S prime:     14.70 cm/s  TAPSE (M-mode): 2.3 cm   LEFT ATRIUM             Index        RIGHT ATRIUM           Index  LA diam:        4.10 cm 2.26 cm/m   RA Area:     16.00 cm  LA Vol (A2C):   63.7 ml 35.06 ml/m  RA Volume:   40.70 ml  22.40 ml/m  LA Vol (A4C):   51.9 ml 28.57 ml/m  LA Biplane Vol: 58.1 ml 31.98 ml/m   AORTIC VALVE  AV Area (Vmax):    0.68 cm  AV Area (Vmean):   0.72 cm  AV Area (VTI):     0.73 cm  AV Vmax:           521.00 cm/s  AV Vmean:          375.000 cm/s  AV VTI:            1.230 m  AV Peak Grad:      108.6 mmHg  AV Mean Grad:      63.0 mmHg  LVOT Vmax:         103.00 cm/s  LVOT Vmean:        78.000 cm/s  LVOT VTI:          0.260 m  LVOT/AV VTI ratio: 0.21    AORTA  Ao Root diam: 2.70 cm  Ao Asc diam:  3.50 cm   MITRAL VALVE                TRICUSPID VALVE  MV Area (PHT): 2.75 cm     TR Peak grad:   26.4 mmHg  MV Decel Time: 276 msec     TR Vmax:        257.00 cm/s  MV E velocity: 103.00 cm/s  MV A velocity: 154.00 cm/s  SHUNTS  MV E/A ratio:  0.67         Systemic VTI:  0.26 m                              Systemic Diam: 2.10 cm   Recent Labs: 05/18/2022: Magnesium 1.7 09/04/2022: ALT 13 01/28/2023: BUN 12; Creatinine, Ser 0.72; Hemoglobin 10.6; Platelets 354; Potassium 4.2; Sodium 137   Lipid Panel    Component Value Date/Time   CHOL 132 05/17/2022 0435   TRIG 129 05/17/2022 0435   HDL 35 (L) 05/17/2022 0435   CHOLHDL 3.8 05/17/2022 0435   VLDL 26 05/17/2022 0435   LDLCALC 71 05/17/2022 0435     Wt Readings from Last 3 Encounters:  02/03/23 74.2 kg  01/28/23 73.2 kg   09/16/22 76.2 kg     Assessment and Plan:   1.  Severe Aortic Valve Stenosis: She has severe aortic valve stenosis. She has NYHA class 1 symptoms. I have personally reviewed the echo images. The aortic valve is thickened and calcified with limited leaflet mobility. I think she would benefit from AVR. Given advanced age, she is not a good candidate for conventional AVR by surgical approach. I think she may be a good candidate for TAVR. Given the critical AS, I think we should proceed with TAVR workup despite her minimal symptoms.   I have reviewed the natural history of aortic stenosis with the patient and their family members  who are present today. We have discussed the limitations of medical therapy and the poor prognosis associated with symptomatic aortic stenosis. We have reviewed potential treatment options, including palliative medical therapy, conventional surgical aortic valve replacement, and transcatheter aortic valve replacement. We discussed treatment options in the context of the patient's specific comorbid medical conditions.   She would like to proceed with planning for TAVR. I will arrange a right and left heart catheterization at [facility] 02/07/23. Risks and benefits of the cath procedure and the valve procedure are reviewed with the patient. After the cath, she will have a cardiac CT, CTA of the chest/abdomen and pelvis and will then be referred to see one of the CT surgeons on our TAVR team.     Labs/ tests ordered today include:  No orders of the defined types were placed in this encounter.  Disposition:   F/U will be arranged with the structural team  Signed, Verne Carrow, MD, [facility] 02/03/2023 1:26 PM    [facility] [address] [place], [place], [place]  [address] Phone: [phone]; Fax: [phone]

## 2023-02-03 NOTE — H&P (View-Only) (Signed)
[facility] Consult Note  Chief Complaint  Patient presents with   New Patient (Initial Visit)    Aortic stenosis   History of Present Illness: 82 yo female with history of HTN, hyperlipidemia, Diabetes, former tobacco abuse, prior TIA and severe aortic stenosis here today as a new consult, referred by Dr. Odis Hollingshead, for further discussion regarding her aortic stenosis and possible TAVR. She is followed by Dr. Odis Hollingshead. Echo 01/15/23 with LVEF=60-65%. Normal RV function. Critical aortic stenosis with mean gradient 63 mmHg, peak gradient 108 mmHg, AVA 0.73 cm2, DI 0.21.   She tells me today that she feels well overall but has some fatigue. She has lower extremity edema. She has no chest pain or pressure. No dyspnea. She lives in [place], [place]. She is retired from Warehouse manager work. She has upper dentures. She has no active dental issues.   Primary Care Physician: [name], MD Primary Cardiologist: Odis Hollingshead Referring Cardiologist: Odis Hollingshead  Past Medical History:  Diagnosis Date   Aortic stenosis    Arthritis    Cancer (HCC)    hx ov colon cancer   Diabetes mellitus without complication (HCC)    GERD (gastroesophageal reflux disease)    Heart murmur    Hyperlipidemia    Hypertension    Stroke (HCC)    mini stroke- no deficits approx 2 years ago   TIA (transient ischemic attack)     Past Surgical History:  Procedure Laterality Date   ABDOMINAL HYSTERECTOMY     CATARACT EXTRACTION Bilateral 11/27/2020   COLON SURGERY     COLONOSCOPY WITH PROPOFOL N/A 05/17/2022   Procedure: COLONOSCOPY WITH PROPOFOL;  Surgeon: Kathi Der, MD;  Location: [facility];  Service: Gastroenterology;  Laterality: N/A;   right knee replacement      TOTAL KNEE ARTHROPLASTY Left 09/16/2022   Procedure: TOTAL KNEE ARTHROPLASTY;  Surgeon: Joen Laura, MD;  Location: [facility];  Service: Orthopedics;  Laterality: Left;   TOTAL KNEE ARTHROPLASTY Right     Current Outpatient  Medications  Medication Sig Dispense Refill   amoxicillin (AMOXIL) 500 MG tablet Take 2,000 mg by mouth See admin instructions. Take 4 tablets (2000 mg) by mouth 1 hour prior to dental appointments.     atorvastatin (LIPITOR) 10 MG tablet Take 10 mg by mouth in the morning.     Carboxymethylcellulose Sod PF (THERATEARS PF) 0.25 % SOLN Place 1-2 drops into both eyes 3 (three) times daily as needed (dry/irritated eyes.).     cyanocobalamin (VITAMIN B12) 1000 MCG tablet Take 1,000 mcg by mouth in the morning.     DILT-XR 240 MG 24 hr capsule Take 240 mg by mouth in the morning.     glimepiride (AMARYL) 1 MG tablet Take 1 mg by mouth in the morning.     Lancets (ONETOUCH DELICA PLUS LANCET33G) MISC Apply 1 each topically daily.     lansoprazole (PREVACID) 30 MG capsule Take 30 mg by mouth daily before breakfast.     losartan (COZAAR) 50 MG tablet Take 50 mg by mouth daily.     magic mouthwash (nystatin, hydrocortisone, diphenhydrAMINE) suspension Swish and spit 5 mLs daily as needed (oral irritation.).     metoprolol succinate (TOPROL-XL) 25 MG 24 hr tablet TAKE 1 TABLET(25 MG) BY MOUTH DAILY 90 tablet 3   ONETOUCH ULTRA test strip 1 each daily.     potassium chloride SA (KLOR-CON) 20 MEQ tablet Take 20 mEq by mouth in the morning.     sodium chloride (OCEAN) 0.65 %  SOLN nasal spray Place 1 spray into both nostrils as needed for congestion.     No current facility-administered medications for this visit.    Allergies  Allergen Reactions   Latex Other (See Comments)    Made the hands sore when worn   Nsaids Other (See Comments)    Internal bleeding (Meloxicam is suspected as the reason for today's ER visit)    Social History   Socioeconomic History   Marital status: Single    Spouse name: Not on file   Number of children: 0   Years of education: Not on file   Highest education level: Not on file  Occupational History   Occupation: Retired-Clerical work  Tobacco Use   Smoking status:  Former    Current packs/day: 0.00    Average packs/day: 1 pack/day for 7.0 years (7.0 ttl pk-yrs)    Types: Cigarettes    Start date: 12    Quit date: 1990    Years since quitting: 34.7   Smokeless tobacco: Never  Vaping Use   Vaping status: Never Used  Substance and Sexual Activity   Alcohol use: Yes    Alcohol/week: 2.0 standard drinks of alcohol    Types: 1 Glasses of wine, 1 Cans of beer per week    Comment: occasionally   Drug use: Never   Sexual activity: Not on file  Other Topics Concern   Not on file  Social History Narrative   Not on file   Social Determinants of Health   Financial Resource Strain: Not on file  Food Insecurity: No Food Insecurity (09/16/2022)   Hunger Vital Sign    Worried About Running Out of Food in the Last Year: Never true    Ran Out of Food in the Last Year: Never true  Transportation Needs: No Transportation Needs (09/16/2022)   PRAPARE - Administrator, Civil Service (Medical): No    Lack of Transportation (Non-Medical): No  Physical Activity: Not on file  Stress: Not on file  Social Connections: Not on file  Intimate Partner Violence: Not At Risk (09/16/2022)   Humiliation, Afraid, Rape, and Kick questionnaire    Fear of Current or Ex-Partner: No    Emotionally Abused: No    Physically Abused: No    Sexually Abused: No    Family History  Problem Relation Age of Onset   Cancer - Colon Mother    Hypertension Father    Cancer - Lung Brother    Hypertension Sister    Cancer Brother    Heart attack Brother    Hypertension Brother     Review of Systems:  As stated in the HPI and otherwise negative.   BP (!) 142/70   Pulse 76   Ht 5\' 4"  (1.626 m)   Wt 74.2 kg   SpO2 96%   BMI 28.08 kg/m   Physical Examination: General: Well developed, well nourished, NAD  HEENT: OP clear, mucus membranes moist  SKIN: warm, dry. No rashes. Neuro: No focal deficits  Musculoskeletal: Muscle strength 5/5 all ext  Psychiatric: Mood  and affect normal  Neck: No JVD, no carotid bruits, no thyromegaly, no lymphadenopathy.  Lungs:Clear bilaterally, no wheezes, rhonci, crackles Cardiovascular: Regular rate and rhythm. Loud, harsh, late peaking systolic murmur.  Abdomen:Soft. Bowel sounds present. Non-tender.  Extremities: No lower extremity edema. Pulses are 2 + in the bilateral DP/PT.  EKG:  EKG  ordered today. The ekg ordered today demonstrates   Echo 01/15/23:  1. Left  ventricular ejection fraction, by estimation, is 60 to 65%. The  left ventricle has normal function. The left ventricle has no regional  wall motion abnormalities. Left ventricular diastolic parameters are  indeterminate. Elevated left atrial  pressure. The E/e' is 22.   2. Right ventricular systolic function is normal. The right ventricular  size is normal. There is normal pulmonary artery systolic pressure. The  estimated right ventricular systolic pressure is 29.4 mmHg.   3. The mitral valve is degenerative. Trivial mitral valve regurgitation.  No evidence of mitral stenosis.   4. Native valve, calcified trileaflet, severe / critical aortic stenosis  (max velocity noted at RSB peak velocity 5.68m/s, peak gradient ,  mean gradient , AVA VTI 0.73cm2, DI 0.21).. Aortic valve  regurgitation is not visualized.   5. The inferior vena cava is normal in size with greater than 50%  respiratory variability, suggesting right atrial pressure of 3 mmHg.   6. Rhythm strip during this exam demonstrates normal sinus rhythm.   Comparison(s): Prior study 07/04/2022: LVEF 60-65%, no RWMA, elevated LVEDP,  severe AS (peak velocity 4.76m/s, PG 79.25mmHG, MG , AVA 0.67cm2, DI  0.19).   FINDINGS   Left Ventricle: Left ventricular ejection fraction, by estimation, is 60  to 65%. The left ventricle has normal function. The left ventricle has no  regional wall motion abnormalities. The left ventricular internal cavity  size was normal in size. There is    no left ventricular hypertrophy. Left ventricular diastolic parameters  are indeterminate. Elevated left atrial pressure. The E/e' is 22.   Right Ventricle: The right ventricular size is normal. No increase in  right ventricular wall thickness. Right ventricular systolic function is  normal. There is normal pulmonary artery systolic pressure. The tricuspid  regurgitant velocity is 2.57 m/s, and   with an assumed right atrial pressure of 3 mmHg, the estimated right  ventricular systolic pressure is 29.4 mmHg.   Left Atrium: Left atrial size was normal in size.   Right Atrium: Right atrial size was normal in size.   Pericardium: There is no evidence of pericardial effusion.   Mitral Valve: The mitral valve is degenerative in appearance. Mild to  moderate mitral annular calcification. Trivial mitral valve regurgitation.  No evidence of mitral valve stenosis.   Tricuspid Valve: The tricuspid valve is grossly normal. Tricuspid valve  regurgitation is trivial. No evidence of tricuspid stenosis.   Aortic Valve: Native valve, calcified trileaflet, severe / critical aortic  stenosis (max velocity noted at RSB peak velocity 5.36m/s, peak gradient  , mean gradient , AVA VTI 0.73cm2, DI 0.21). Aortic valve  regurgitation is not visualized.  Aortic valve mean gradient measures 63.0 mmHg. Aortic valve peak gradient  measures 108.6 mmHg. Aortic valve area, by VTI measures 0.73 cm.   Pulmonic Valve: The pulmonic valve was normal in structure. Pulmonic valve  regurgitation is trivial. No evidence of pulmonic stenosis.   Aorta: The aortic root and ascending aorta are structurally normal, with  no evidence of dilitation.   Venous: The inferior vena cava is normal in size with greater than 50%  respiratory variability, suggesting right atrial pressure of 3 mmHg.   IAS/Shunts: The atrial septum is grossly normal.   EKG: Rhythm strip during this exam demonstrates normal sinus  rhythm.     LEFT VENTRICLE  PLAX 2D  LVIDd:         3.90 cm   Diastology  LVIDs:         2.60 cm  LV e' medial:    4.24 cm/s  LV PW:         1.00 cm   LV E/e' medial:  24.3  LV IVS:        1.00 cm   LV e' lateral:   5.22 cm/s  LVOT diam:     2.10 cm   LV E/e' lateral: 19.7  LV SV:         90  LV SV Index:   50  LVOT Area:     3.46 cm     RIGHT VENTRICLE  RV S prime:     14.70 cm/s  TAPSE (M-mode): 2.3 cm   LEFT ATRIUM             Index        RIGHT ATRIUM           Index  LA diam:        4.10 cm 2.26 cm/m   RA Area:     16.00 cm  LA Vol (A2C):   63.7 ml 35.06 ml/m  RA Volume:   40.70 ml  22.40 ml/m  LA Vol (A4C):   51.9 ml 28.57 ml/m  LA Biplane Vol: 58.1 ml 31.98 ml/m   AORTIC VALVE  AV Area (Vmax):    0.68 cm  AV Area (Vmean):   0.72 cm  AV Area (VTI):     0.73 cm  AV Vmax:           521.00 cm/s  AV Vmean:          375.000 cm/s  AV VTI:            1.230 m  AV Peak Grad:      108.6 mmHg  AV Mean Grad:      63.0 mmHg  LVOT Vmax:         103.00 cm/s  LVOT Vmean:        78.000 cm/s  LVOT VTI:          0.260 m  LVOT/AV VTI ratio: 0.21    AORTA  Ao Root diam: 2.70 cm  Ao Asc diam:  3.50 cm   MITRAL VALVE                TRICUSPID VALVE  MV Area (PHT): 2.75 cm     TR Peak grad:   26.4 mmHg  MV Decel Time: 276 msec     TR Vmax:        257.00 cm/s  MV E velocity: 103.00 cm/s  MV A velocity: 154.00 cm/s  SHUNTS  MV E/A ratio:  0.67         Systemic VTI:  0.26 m                              Systemic Diam: 2.10 cm   Recent Labs: 05/18/2022: Magnesium 1.7 09/04/2022: ALT 13 01/28/2023: BUN 12; Creatinine, Ser 0.72; Hemoglobin 10.6; Platelets 354; Potassium 4.2; Sodium 137   Lipid Panel    Component Value Date/Time   CHOL 132 05/17/2022 0435   TRIG 129 05/17/2022 0435   HDL 35 (L) 05/17/2022 0435   CHOLHDL 3.8 05/17/2022 0435   VLDL 26 05/17/2022 0435   LDLCALC 71 05/17/2022 0435     Wt Readings from Last 3 Encounters:  02/03/23 74.2 kg  01/28/23 73.2 kg   09/16/22 76.2 kg     Assessment and Plan:   1.  Severe Aortic Valve Stenosis: She has severe aortic valve stenosis. She has NYHA class 1 symptoms. I have personally reviewed the echo images. The aortic valve is thickened and calcified with limited leaflet mobility. I think she would benefit from AVR. Given advanced age, she is not a good candidate for conventional AVR by surgical approach. I think she may be a good candidate for TAVR. Given the critical AS, I think we should proceed with TAVR workup despite her minimal symptoms.   I have reviewed the natural history of aortic stenosis with the patient and their family members  who are present today. We have discussed the limitations of medical therapy and the poor prognosis associated with symptomatic aortic stenosis. We have reviewed potential treatment options, including palliative medical therapy, conventional surgical aortic valve replacement, and transcatheter aortic valve replacement. We discussed treatment options in the context of the patient's specific comorbid medical conditions.   She would like to proceed with planning for TAVR. I will arrange a right and left heart catheterization at [facility] 02/07/23. Risks and benefits of the cath procedure and the valve procedure are reviewed with the patient. After the cath, she will have a cardiac CT, CTA of the chest/abdomen and pelvis and will then be referred to see one of the CT surgeons on our TAVR team.     Labs/ tests ordered today include:  No orders of the defined types were placed in this encounter.  Disposition:   F/U will be arranged with the structural team  Signed, Verne Carrow, MD, [facility] 02/03/2023 1:26 PM    [facility] [address] [place], [place], [place]  [address] Phone: [phone]; Fax: [phone]

## 2023-02-03 NOTE — Progress Notes (Signed)
Pre Surgical Assessment: 5 M Walk Test  22M=16.33ft  5 Meter Walk Test- trial 1: 5.19 seconds 5 Meter Walk Test- trial 2: 5.13 seconds 5 Meter Walk Test- trial 3: 5.04 seconds 5 Meter Walk Test Average: 5.12 seconds

## 2023-02-06 ENCOUNTER — Telehealth: Payer: Self-pay | Admitting: *Deleted

## 2023-02-06 NOTE — Telephone Encounter (Addendum)
Cardiac Catheterization scheduled at [facility] for: Friday February 07, 2023 9 AM Arrival time [facility] Main Entrance A at: 7 AM  Nothing to eat after midnight prior to procedure, clear liquids until 5 AM day of procedure.  Medication instructions: -Hold:  Glimepiride-AM of procedure  -Other usual morning medications can be taken with sips of water including aspirin 81 mg.  Patient reports she can take and tolerate lower dose of aspirin 81 mg.  Plan to go home the same day, you will only stay overnight if medically necessary.  You must have responsible adult to drive you home.  Someone must be with you the first 24 hours after you arrive home.  Reviewed procedure instructions with patient.

## 2023-02-07 ENCOUNTER — Encounter (HOSPITAL_COMMUNITY)
Admission: RE | Disposition: A | Discharge status: Home / Self Care | Payer: Self-pay | Source: Home / Self Care | Attending: Cardiovascular Disease

## 2023-02-07 ENCOUNTER — Other Ambulatory Visit: Payer: Self-pay

## 2023-02-07 ENCOUNTER — Ambulatory Visit (HOSPITAL_COMMUNITY)
Admission: RE | Admit: 2023-02-07 | Discharge: 2023-02-07 | Disposition: A | Discharge status: Home / Self Care | Payer: Medicare Other | Attending: Cardiovascular Disease | Admitting: Cardiovascular Disease

## 2023-02-07 DIAGNOSIS — E119 Type 2 diabetes mellitus without complications: Secondary | ICD-10-CM | POA: Insufficient documentation

## 2023-02-07 DIAGNOSIS — I10 Essential (primary) hypertension: Secondary | ICD-10-CM | POA: Insufficient documentation

## 2023-02-07 DIAGNOSIS — Z87891 Personal history of nicotine dependence: Secondary | ICD-10-CM | POA: Diagnosis not present

## 2023-02-07 DIAGNOSIS — I2510 Atherosclerotic heart disease of native coronary artery without angina pectoris: Secondary | ICD-10-CM | POA: Insufficient documentation

## 2023-02-07 DIAGNOSIS — Z7984 Long term (current) use of oral hypoglycemic drugs: Secondary | ICD-10-CM | POA: Diagnosis not present

## 2023-02-07 DIAGNOSIS — Z8673 Personal history of transient ischemic attack (TIA), and cerebral infarction without residual deficits: Secondary | ICD-10-CM | POA: Insufficient documentation

## 2023-02-07 DIAGNOSIS — E785 Hyperlipidemia, unspecified: Secondary | ICD-10-CM | POA: Insufficient documentation

## 2023-02-07 DIAGNOSIS — I350 Nonrheumatic aortic (valve) stenosis: Secondary | ICD-10-CM | POA: Diagnosis not present

## 2023-02-07 HISTORY — PX: RIGHT HEART CATH AND CORONARY ANGIOGRAPHY: CATH118264

## 2023-02-07 LAB — POCT I-STAT 7, (LYTES, BLD GAS, ICA,H+H)
Acid-base deficit: 2 mmol/L (ref 0.0–2.0)
Bicarbonate: 23.2 mmol/L (ref 20.0–28.0)
Calcium, Ion: 1.09 mmol/L — ABNORMAL LOW (ref 1.15–1.40)
HCT: 30 % — ABNORMAL LOW (ref 36.0–46.0)
Hemoglobin: 10.2 g/dL — ABNORMAL LOW (ref 12.0–15.0)
O2 Saturation: 96 %
Potassium: 3.6 mmol/L (ref 3.5–5.1)
Sodium: 144 mmol/L (ref 135–145)
TCO2: 24 mmol/L (ref 22–32)
pCO2 arterial: 38.1 mm[Hg] (ref 32–48)
pH, Arterial: 7.392 (ref 7.35–7.45)
pO2, Arterial: 79 mm[Hg] — ABNORMAL LOW (ref 83–108)

## 2023-02-07 LAB — GLUCOSE, CAPILLARY: Glucose-Capillary: 120 mg/dL — ABNORMAL HIGH (ref 70–99)

## 2023-02-07 LAB — POCT I-STAT EG7
Acid-Base Excess: 0 mmol/L (ref 0.0–2.0)
Bicarbonate: 25.1 mmol/L (ref 20.0–28.0)
Calcium, Ion: 1.16 mmol/L (ref 1.15–1.40)
HCT: 31 % — ABNORMAL LOW (ref 36.0–46.0)
Hemoglobin: 10.5 g/dL — ABNORMAL LOW (ref 12.0–15.0)
O2 Saturation: 71 %
Potassium: 3.8 mmol/L (ref 3.5–5.1)
Sodium: 143 mmol/L (ref 135–145)
TCO2: 26 mmol/L (ref 22–32)
pCO2, Ven: 42.2 mm[Hg] — ABNORMAL LOW (ref 44–60)
pH, Ven: 7.382 (ref 7.25–7.43)
pO2, Ven: 38 mm[Hg] (ref 32–45)

## 2023-02-07 SURGERY — RIGHT HEART CATH AND CORONARY ANGIOGRAPHY
Anesthesia: LOCAL

## 2023-02-07 MED ORDER — HEPARIN SODIUM (PORCINE) 1000 UNIT/ML IJ SOLN
INTRAMUSCULAR | Status: DC | PRN
  Administered 2023-02-07: 3500 [IU] via INTRAVENOUS

## 2023-02-07 MED ORDER — HYDRALAZINE HCL 20 MG/ML IJ SOLN: 10.0000 mg | INTRAMUSCULAR | Status: DC | PRN

## 2023-02-07 MED ORDER — VERAPAMIL HCL 2.5 MG/ML IV SOLN
INTRAVENOUS | Status: AC
  Filled 2023-02-07: qty 2

## 2023-02-07 MED ORDER — ONDANSETRON HCL 4 MG/2ML IJ SOLN: 4.0000 mg | Freq: Four times a day (QID) | INTRAMUSCULAR | Status: DC | PRN

## 2023-02-07 MED ORDER — ACETAMINOPHEN 325 MG PO TABS: 650.0000 mg | ORAL_TABLET | ORAL | Status: DC | PRN

## 2023-02-07 MED ORDER — FENTANYL CITRATE (PF) 100 MCG/2ML IJ SOLN
INTRAMUSCULAR | Status: DC | PRN
  Administered 2023-02-07: 25 ug via INTRAVENOUS

## 2023-02-07 MED ORDER — SODIUM CHLORIDE 0.9 % IV SOLN: INTRAVENOUS | Status: AC

## 2023-02-07 MED ORDER — MIDAZOLAM HCL 2 MG/2ML IJ SOLN
INTRAMUSCULAR | Status: DC | PRN
  Administered 2023-02-07: 1 mg via INTRAVENOUS

## 2023-02-07 MED ORDER — SODIUM CHLORIDE 0.9 % IV SOLN: 250.0000 mL | INTRAVENOUS | Status: DC | PRN

## 2023-02-07 MED ORDER — HEPARIN (PORCINE) IN NACL 2000-0.9 UNIT/L-% IV SOLN
INTRAVENOUS | Status: DC | PRN
  Administered 2023-02-07: 1000 mL

## 2023-02-07 MED ORDER — SODIUM CHLORIDE 0.9% FLUSH: 3.0000 mL | Freq: Two times a day (BID) | INTRAVENOUS | Status: DC

## 2023-02-07 MED ORDER — LIDOCAINE HCL (PF) 1 % IJ SOLN
INTRAMUSCULAR | Status: DC | PRN
  Administered 2023-02-07 (×2): 2 mL

## 2023-02-07 MED ORDER — VERAPAMIL HCL 2.5 MG/ML IV SOLN
INTRAVENOUS | Status: DC | PRN
  Administered 2023-02-07: 10 mL via INTRA_ARTERIAL

## 2023-02-07 MED ORDER — LABETALOL HCL 5 MG/ML IV SOLN: 10.0000 mg | INTRAVENOUS | Status: DC | PRN

## 2023-02-07 MED ORDER — IOHEXOL 350 MG/ML SOLN
INTRAVENOUS | Status: DC | PRN
  Administered 2023-02-07: 40 mL

## 2023-02-07 MED ORDER — SODIUM CHLORIDE 0.9% FLUSH: 3.0000 mL | INTRAVENOUS | Status: DC | PRN

## 2023-02-07 MED ORDER — LIDOCAINE HCL (PF) 1 % IJ SOLN
INTRAMUSCULAR | Status: AC
  Filled 2023-02-07: qty 30

## 2023-02-07 MED ORDER — ASPIRIN 81 MG PO CHEW: 81.0000 mg | CHEWABLE_TABLET | ORAL | Status: DC

## 2023-02-07 MED ORDER — MIDAZOLAM HCL 2 MG/2ML IJ SOLN
INTRAMUSCULAR | Status: AC
  Filled 2023-02-07: qty 2

## 2023-02-07 MED ORDER — FENTANYL CITRATE (PF) 100 MCG/2ML IJ SOLN
INTRAMUSCULAR | Status: AC
  Filled 2023-02-07: qty 2

## 2023-02-07 SURGICAL SUPPLY — 13 items
CATH 5FR JL3.5 JR4 ANG PIG MP (CATHETERS) IMPLANT
CATH BALLN WEDGE 5F 110CM (CATHETERS) IMPLANT
DEVICE RAD COMP TR BAND LRG (VASCULAR PRODUCTS) IMPLANT
DEVICE RAD TR BAND REGULAR (VASCULAR PRODUCTS) IMPLANT
GLIDESHEATH SLEND SS 6F .021 (SHEATH) IMPLANT
GUIDEWIRE .025 260CM (WIRE) IMPLANT
GUIDEWIRE INQWIRE 1.5J.035X260 (WIRE) IMPLANT
INQWIRE 1.5J .035X260CM (WIRE) ×1
KIT SYRINGE INJ CVI SPIKEX1 (MISCELLANEOUS) IMPLANT
PACK CARDIAC CATHETERIZATION (CUSTOM PROCEDURE TRAY) ×1 IMPLANT
SET ATX-X65L (MISCELLANEOUS) IMPLANT
SHEATH 6FR 75 DEST SLENDER (SHEATH) IMPLANT
SHEATH GLIDE SLENDER 4/5FR (SHEATH) IMPLANT

## 2023-02-07 NOTE — Discharge Instructions (Signed)

## 2023-02-07 NOTE — Progress Notes (Signed)
Right radial TR band removed at 1225, gauze dressing applied. Right radial clean, dry, and intact. Patient walked to the bathroom without difficulties.

## 2023-02-07 NOTE — Interval H&P Note (Signed)
History and Physical Interval Note:  02/07/2023 7:53 AM  Jacqueline Rich  has presented today for surgery, with the diagnosis of aortic stenosis.  The various methods of treatment have been discussed with the patient and family. After consideration of risks, benefits and other options for treatment, the patient has consented to  Procedure(s): RIGHT/LEFT HEART CATH AND CORONARY ANGIOGRAPHY (N/A) as a surgical intervention.  The patient's history has been reviewed, patient examined, no change in status, stable for surgery.  I have reviewed the patient's chart and labs.  Questions were answered to the patient's satisfaction.    Cath Lab Visit (complete for each Cath Lab visit)  Clinical Evaluation Leading to the Procedure:   ACS: No.  Non-ACS:    Anginal Classification: CCS II  Anti-ischemic medical therapy: Minimal Therapy (1 class of medications)  Non-Invasive Test Results: No non-invasive testing performed  Prior CABG: No previous CABG        Verne Carrow

## 2023-02-10 ENCOUNTER — Encounter (HOSPITAL_COMMUNITY): Payer: Self-pay | Admitting: Cardiovascular Disease

## 2023-02-11 ENCOUNTER — Ambulatory Visit (HOSPITAL_COMMUNITY)
Admission: RE | Admit: 2023-02-11 | Discharge: 2023-02-11 | Disposition: A | Discharge status: Home / Self Care | Payer: Medicare Other | Source: Ambulatory Visit | Attending: Cardiovascular Disease | Admitting: Cardiovascular Disease

## 2023-02-11 DIAGNOSIS — Z01818 Encounter for other preprocedural examination: Secondary | ICD-10-CM | POA: Diagnosis not present

## 2023-02-11 DIAGNOSIS — I350 Nonrheumatic aortic (valve) stenosis: Secondary | ICD-10-CM | POA: Diagnosis not present

## 2023-02-11 DIAGNOSIS — I2510 Atherosclerotic heart disease of native coronary artery without angina pectoris: Secondary | ICD-10-CM | POA: Diagnosis not present

## 2023-02-11 DIAGNOSIS — K5790 Diverticulosis of intestine, part unspecified, without perforation or abscess without bleeding: Secondary | ICD-10-CM | POA: Diagnosis not present

## 2023-02-11 DIAGNOSIS — N289 Disorder of kidney and ureter, unspecified: Secondary | ICD-10-CM | POA: Diagnosis not present

## 2023-02-11 MED ORDER — IOHEXOL 350 MG/ML SOLN
100.0000 mL | Freq: Once | INTRAVENOUS | Status: AC | PRN
  Administered 2023-02-11: 100 mL via INTRAVENOUS

## 2023-02-18 ENCOUNTER — Other Ambulatory Visit: Payer: Self-pay

## 2023-02-18 DIAGNOSIS — I350 Nonrheumatic aortic (valve) stenosis: Secondary | ICD-10-CM

## 2023-02-18 NOTE — Progress Notes (Signed)
STS: 3.18%,

## 2023-02-19 NOTE — Progress Notes (Unsigned)
[address]       [place] [address]             [phone]           Jacqueline Rich [facility] Medical Record #[id number] Date of Birth: 11-Nov-1940  [name], DO [name], MD  Chief Complaint: Increasing fatigue    History of Present Illness:     Pt is an 82 yo female who was worked up for increasing fatigue and heart murmur. Pt denies DOE or lightheadedness or CP and had TTE with had normal EF with a mean aortic valve gradient of and with normal RV function also. She was evaluated also by Dr Sanjuana Kava and following cath without CAD and CTA performed which had acceptable anatomy for TAVR, was sent to have surgical consultation. Of note pt does at night feel like she hears wheezing in her chest and causes her to roll around to avoid hearing sounds. Pt lives alone and has no support system      Past Medical History:  Diagnosis Date   Arthritis    Cancer (HCC)    hx ov colon cancer   Diabetes mellitus without complication (HCC)    GERD (gastroesophageal reflux disease)    Hyperlipidemia    Hypertension    Severe aortic stenosis    Stroke (HCC)    mini stroke- no deficits approx 2 years ago   TIA (transient ischemic attack)     Past Surgical History:  Procedure Laterality Date   ABDOMINAL HYSTERECTOMY     CATARACT EXTRACTION Bilateral 11/27/2020   COLON SURGERY     COLONOSCOPY WITH PROPOFOL N/A 05/17/2022   Procedure: COLONOSCOPY WITH PROPOFOL;  Surgeon: Kathi Der, MD;  Location: [facility];  Service: Gastroenterology;  Laterality: N/A;   RIGHT HEART CATH AND CORONARY ANGIOGRAPHY N/A 02/07/2023   Procedure: RIGHT HEART CATH AND CORONARY ANGIOGRAPHY;  Surgeon: Kathleene Hazel, MD;  Location: [facility];  Service: Cardiovascular;  Laterality: N/A;   right knee replacement      TOTAL KNEE ARTHROPLASTY Left 09/16/2022   Procedure: TOTAL KNEE ARTHROPLASTY;  Surgeon: Joen Laura, MD;   Location: [facility];  Service: Orthopedics;  Laterality: Left;   TOTAL KNEE ARTHROPLASTY Right     Social History   Tobacco Use  Smoking Status Former   Current packs/day: 0.00   Average packs/day: 1 pack/day for 7.0 years (7.0 ttl pk-yrs)   Types: Cigarettes   Start date: 28   Quit date: 52   Years since quitting: 34.8  Smokeless Tobacco Never    Social History   Substance and Sexual Activity  Alcohol Use Yes   Alcohol/week: 2.0 standard drinks of alcohol   Types: 1 Glasses of wine, 1 Cans of beer per week   Comment: occasionally    Social History   Socioeconomic History   Marital status: Single    Spouse name: Not on file   Number of children: 0   Years of education: Not on file   Highest education level: Not on file  Occupational History   Occupation: Retired-Clerical work  Tobacco Use   Smoking status: Former    Current packs/day: 0.00    Average packs/day: 1 pack/day for 7.0 years (7.0 ttl pk-yrs)    Types: Cigarettes    Start date: 20    Quit date: 1990    Years since quitting: 34.8   Smokeless tobacco: Never  Vaping Use   Vaping  status: Never Used  Substance and Sexual Activity   Alcohol use: Yes    Alcohol/week: 2.0 standard drinks of alcohol    Types: 1 Glasses of wine, 1 Cans of beer per week    Comment: occasionally   Drug use: Never   Sexual activity: Not on file  Other Topics Concern   Not on file  Social History Narrative   Not on file   Social Determinants of Health   Financial Resource Strain: Not on file  Food Insecurity: No Food Insecurity (09/16/2022)   Hunger Vital Sign    Worried About Running Out of Food in the Last Year: Never true    Ran Out of Food in the Last Year: Never true  Transportation Needs: No Transportation Needs (09/16/2022)   PRAPARE - Administrator, Civil Service (Medical): No    Lack of Transportation (Non-Medical): No  Physical Activity: Not on file  Stress: Not on file  Social Connections: Not  on file  Intimate Partner Violence: Not At Risk (09/16/2022)   Humiliation, Afraid, Rape, and Kick questionnaire    Fear of Current or Ex-Partner: No    Emotionally Abused: No    Physically Abused: No    Sexually Abused: No    Allergies  Allergen Reactions   Latex Other (See Comments)    Made the hands sore when worn   Nsaids Other (See Comments)    Internal bleeding (Meloxicam is suspected as the reason for today's ER visit)    Current Outpatient Medications  Medication Sig Dispense Refill   amoxicillin (AMOXIL) 500 MG tablet Take 2,000 mg by mouth See admin instructions. Take 4 tablets (2000 mg) by mouth 1 hour prior to dental appointments.     atorvastatin (LIPITOR) 10 MG tablet Take 10 mg by mouth in the morning.     Carboxymethylcellulose Sod PF (THERATEARS PF) 0.25 % SOLN Place 1-2 drops into both eyes 3 (three) times daily as needed (dry/irritated eyes.).     cyanocobalamin (VITAMIN B12) 1000 MCG tablet Take 1,000 mcg by mouth in the morning.     DILT-XR 240 MG 24 hr capsule Take 240 mg by mouth in the morning.     glimepiride (AMARYL) 1 MG tablet Take 1 mg by mouth in the morning.     Lancets (ONETOUCH DELICA PLUS LANCET33G) MISC Apply 1 each topically daily.     lansoprazole (PREVACID) 30 MG capsule Take 30 mg by mouth daily before breakfast.     losartan (COZAAR) 50 MG tablet Take 50 mg by mouth daily.     metoprolol succinate (TOPROL-XL) 25 MG 24 hr tablet TAKE 1 TABLET(25 MG) BY MOUTH DAILY 90 tablet 3   ONETOUCH ULTRA test strip 1 each daily.     potassium chloride SA (KLOR-CON) 20 MEQ tablet Take 20 mEq by mouth in the morning.     sodium chloride (OCEAN) 0.65 % SOLN nasal spray Place 1 spray into both nostrils as needed for congestion.     No current facility-administered medications for this visit.     Family History  Problem Relation Age of Onset   Cancer - Colon Mother    Hypertension Father    Cancer - Lung Brother    Hypertension Sister    Cancer Brother     Heart attack Brother    Hypertension Brother        Physical Exam: Teeth in good repair Lungs: clear Card: RR with harsh systolic murmur Ext: no edema Neuro: alert  Diagnostic Studies & Laboratory data: I have personally reviewed the following studies and agree with the findings   TTE (12/2022) IMPRESSIONS     1. Left ventricular ejection fraction, by estimation, is 60 to 65%. The  left ventricle has normal function. The left ventricle has no regional  wall motion abnormalities. Left ventricular diastolic parameters are  indeterminate. Elevated left atrial  pressure. The E/e' is 22.   2. Right ventricular systolic function is normal. The right ventricular  size is normal. There is normal pulmonary artery systolic pressure. The  estimated right ventricular systolic pressure is 29.4 mmHg.   3. The mitral valve is degenerative. Trivial mitral valve regurgitation.  No evidence of mitral stenosis.   4. Native valve, calcified trileaflet, severe / critical aortic stenosis  (max velocity noted at RSB peak velocity 5.46m/s, peak gradient ,  mean gradient , AVA VTI 0.73cm2, DI 0.21).. Aortic valve  regurgitation is not visualized.   5. The inferior vena cava is normal in size with greater than 50%  respiratory variability, suggesting right atrial pressure of 3 mmHg.   6. Rhythm strip during this exam demonstrates normal sinus rhythm.   Comparison(s): Prior study 07/04/2022: LVEF 60-65%, no RWMA, elevated LVEDP,  severe AS (peak velocity 4.77m/s, PG 79.46mmHG, MG , AVA 0.67cm2, DI  0.19).   FINDINGS   Left Ventricle: Left ventricular ejection fraction, by estimation, is 60  to 65%. The left ventricle has normal function. The left ventricle has no  regional wall motion abnormalities. The left ventricular internal cavity  size was normal in size. There is   no left ventricular hypertrophy. Left ventricular diastolic parameters  are indeterminate. Elevated  left atrial pressure. The E/e' is 22.   Right Ventricle: The right ventricular size is normal. No increase in  right ventricular wall thickness. Right ventricular systolic function is  normal. There is normal pulmonary artery systolic pressure. The tricuspid  regurgitant velocity is 2.57 m/s, and   with an assumed right atrial pressure of 3 mmHg, the estimated right  ventricular systolic pressure is 29.4 mmHg.   Left Atrium: Left atrial size was normal in size.   Right Atrium: Right atrial size was normal in size.   Pericardium: There is no evidence of pericardial effusion.   Mitral Valve: The mitral valve is degenerative in appearance. Mild to  moderate mitral annular calcification. Trivial mitral valve regurgitation.  No evidence of mitral valve stenosis.   Tricuspid Valve: The tricuspid valve is grossly normal. Tricuspid valve  regurgitation is trivial. No evidence of tricuspid stenosis.   Aortic Valve: Native valve, calcified trileaflet, severe / critical aortic  stenosis (max velocity noted at RSB peak velocity 5.13m/s, peak gradient  , mean gradient , AVA VTI 0.73cm2, DI 0.21). Aortic valve  regurgitation is not visualized.  Aortic valve mean gradient measures 63.0 mmHg. Aortic valve peak gradient  measures 108.6 mmHg. Aortic valve area, by VTI measures 0.73 cm.   Pulmonic Valve: The pulmonic valve was normal in structure. Pulmonic valve  regurgitation is trivial. No evidence of pulmonic stenosis.   Aorta: The aortic root and ascending aorta are structurally normal, with  no evidence of dilitation.   Venous: The inferior vena cava is normal in size with greater than 50%  respiratory variability, suggesting right atrial pressure of 3 mmHg.   IAS/Shunts: The atrial septum is grossly normal.   EKG: Rhythm strip during this exam demonstrates normal sinus rhythm.     LEFT VENTRICLE  PLAX 2D  LVIDd:  3.90 cm   Diastology  LVIDs:         2.60 cm   LV  e' medial:    4.24 cm/s  LV PW:         1.00 cm   LV E/e' medial:  24.3  LV IVS:        1.00 cm   LV e' lateral:   5.22 cm/s  LVOT diam:     2.10 cm   LV E/e' lateral: 19.7  LV SV:         90  LV SV Index:   50  LVOT Area:     3.46 cm     RIGHT VENTRICLE  RV S prime:     14.70 cm/s  TAPSE (M-mode): 2.3 cm   LEFT ATRIUM             Index        RIGHT ATRIUM           Index  LA diam:        4.10 cm 2.26 cm/m   RA Area:     16.00 cm  LA Vol (A2C):   63.7 ml 35.06 ml/m  RA Volume:   40.70 ml  22.40 ml/m  LA Vol (A4C):   51.9 ml 28.57 ml/m  LA Biplane Vol: 58.1 ml 31.98 ml/m   AORTIC VALVE  AV Area (Vmax):    0.68 cm  AV Area (Vmean):   0.72 cm  AV Area (VTI):     0.73 cm  AV Vmax:           521.00 cm/s  AV Vmean:          375.000 cm/s  AV VTI:            1.230 m  AV Peak Grad:      108.6 mmHg  AV Mean Grad:      63.0 mmHg  LVOT Vmax:         103.00 cm/s  LVOT Vmean:        78.000 cm/s  LVOT VTI:          0.260 m  LVOT/AV VTI ratio: 0.21    AORTA  Ao Root diam: 2.70 cm  Ao Asc diam:  3.50 cm   MITRAL VALVE                TRICUSPID VALVE  MV Area (PHT): 2.75 cm     TR Peak grad:   26.4 mmHg  MV Decel Time: 276 msec     TR Vmax:        257.00 cm/s  MV E velocity: 103.00 cm/s  MV A velocity: 154.00 cm/s  SHUNTS  MV E/A ratio:  0.67         Systemic VTI:  0.26 m                              Systemic Diam: 2.10 cm   CATH (01/2023) Conclusion      Prox RCA lesion is 30% stenosed.   Mid RCA lesion is 50% stenosed.   Ost Cx to Prox Cx lesion is 40% stenosed.   Ost LAD to Prox LAD lesion is 40% stenosed.   Mid LAD to Dist LAD lesion is 20% stenosed.   Mild to moderate non-obstructive CAD Normal right heart pressures Hemo Data  Flowsheet Row Most Recent Value  Fick Cardiac Output 6.58 L/min  Fick  Cardiac Output Index 3.69 (L/min)/BSA  RA A Wave 8 mmHg  RA V Wave 5 mmHg  RA Mean 4 mmHg  RV Systolic Pressure 32 mmHg  RV Diastolic Pressure 1 mmHg  RV EDP 8  mmHg  PA Systolic Pressure 27 mmHg  PA Diastolic Pressure 13 mmHg  PA Mean 18 mmHg  PW A Wave 18 mmHg  PW V Wave 13 mmHg  PW Mean 10 mmHg  AO Systolic Pressure 149 mmHg  AO Diastolic Pressure 70 mmHg  AO Mean 102 mmHg  QP/QS 1  TPVR Index 4.88 HRUI  TSVR Index 27.64 HRUI  PVR SVR Ratio 0.08  TPVR/TSVR Ratio 0.18    Recent Radiology Findings:   CTA (01/2023) FINDINGS: Image quality: Excellent.   Noise artifact is: Limited.   Valve Morphology: Tricuspid aortic valve with severe calcifications and severely restricted leaflet motion in systole. Bulky calcification of the LCC.   Aortic Valve Calcium score: 2493   Aortic annular dimension:   Phase assessed: 30%   Annular area: 367 mm2   Annular perimeter: 69.1 mm   Max diameter: 23.8 mm   Min diameter: 20.1 mm   Annular and subannular calcification: None.   Membranous septum length: 3.9 mm   Optimal coplanar projection: LAO 30 CRA 44   Coronary Artery Height above Annulus:   Left Main: 13.7 mm   Right Coronary: 14.3 mm   Sinus of Valsalva Measurements:   Non-coronary: 31 mm   Right-coronary: 29 mm   Left-coronary: 31 mm   Sinus of Valsalva Height:   Non-coronary: 22.0 mm   Right-coronary: 18.6 mm   Left-coronary: 20.4 mm   Sinotubular Junction: 29 mm   Ascending Thoracic Aorta: 35 mm   Coronary Arteries: Normal coronary origin. Right dominance. The study was performed without use of NTG and is insufficient for plaque evaluation. Please refer to recent cardiac catheterization for coronary assessment. Severe 3-vessel coronary calcifications.   Cardiac Morphology:   Right Atrium: Right atrial size is within normal limits.   Right Ventricle: The right ventricular cavity is within normal limits.   Left Atrium: Left atrial size is normal in size with no left atrial appendage filling defect.   Left Ventricle: The ventricular cavity size is within normal limits.   Pulmonary arteries: Normal  in size without proximal filling defect.   Pulmonary veins: Normal pulmonary venous drainage.   Pericardium: Normal thickness with no significant effusion or calcium present.   Mitral Valve: The mitral valve is normal structure without significant calcification.   Extra-cardiac findings: See attached radiology report for non-cardiac structures.   IMPRESSION: 1. Annular measurements support a 23 mm S3 or 26 mm Evolut Pro.   2. No significant annular or subannular calcifications.   3. Sufficient coronary to annulus distance.   4. Optimal Fluoroscopic Angle for Delivery: LAO 30 CRA 44      Recent Lab Findings: Lab Results  Component Value Date   WBC 8.9 01/28/2023   HGB 10.5 (L) 02/07/2023   HCT 31.0 (L) 02/07/2023   PLT 354 01/28/2023   GLUCOSE 93 01/28/2023   CHOL 132 05/17/2022   TRIG 129 05/17/2022   HDL 35 (L) 05/17/2022   LDLCALC 71 05/17/2022   ALT 13 09/04/2022   AST 16 09/04/2022   NA 143 02/07/2023   K 3.8 02/07/2023   CL 103 01/28/2023   CREATININE 0.72 01/28/2023   BUN 12 01/28/2023   CO2 23 01/28/2023   INR 1.1 05/15/2022   HGBA1C 6.5 (H) 09/04/2022  Assessment / Plan:     82 yo female with NYHA class 1 symptoms of severe AS with normal LV function and no CAD. Pt has a class I indication for AVR and at her age would best be served with TAVR with a 23 mm sapien via the femoral approach. We discussed the natural history of AS and she understands the risks, goals and recovery from TAVR and wishes to proceed. We also discussed if the need for bailout with emergency open heart surgery was needed, she would be a candidate.   I have spent 60 min in review of the records, viewing studies and in face to face with patient and in coordination of future care    Eugenio Hoes 02/19/2023 4:53 PM

## 2023-02-20 ENCOUNTER — Encounter: Payer: Self-pay | Admitting: Thoracic Surgery (Cardiothoracic Vascular Surgery)

## 2023-02-20 ENCOUNTER — Institutional Professional Consult (permissible substitution): Payer: Medicare Other | Admitting: Thoracic Surgery (Cardiothoracic Vascular Surgery)

## 2023-02-20 VITALS — BP 134/73 | HR 74 | Resp 20 | Ht 64.0 in | Wt 162.0 lb

## 2023-02-20 DIAGNOSIS — I350 Nonrheumatic aortic (valve) stenosis: Secondary | ICD-10-CM

## 2023-02-20 NOTE — Patient Instructions (Signed)
TAVR

## 2023-02-21 ENCOUNTER — Encounter (HOSPITAL_COMMUNITY)
Admission: RE | Admit: 2023-02-21 | Discharge: 2023-02-21 | Disposition: A | Discharge status: Home / Self Care | Payer: Medicare Other | Source: Ambulatory Visit | Attending: Internal Medicine | Admitting: Internal Medicine

## 2023-02-21 ENCOUNTER — Other Ambulatory Visit: Payer: Self-pay

## 2023-02-21 ENCOUNTER — Ambulatory Visit (HOSPITAL_COMMUNITY)
Admission: RE | Admit: 2023-02-21 | Discharge: 2023-02-21 | Disposition: A | Discharge status: Home / Self Care | Payer: Medicare Other | Source: Ambulatory Visit | Attending: Internal Medicine | Admitting: Internal Medicine

## 2023-02-21 VITALS — BP 147/62 | HR 61 | Temp 98.2°F | Resp 18 | Ht 64.0 in | Wt 163.9 lb

## 2023-02-21 DIAGNOSIS — Z01812 Encounter for preprocedural laboratory examination: Secondary | ICD-10-CM | POA: Diagnosis not present

## 2023-02-21 DIAGNOSIS — Z01818 Encounter for other preprocedural examination: Secondary | ICD-10-CM | POA: Diagnosis not present

## 2023-02-21 DIAGNOSIS — Z1152 Encounter for screening for COVID-19: Secondary | ICD-10-CM | POA: Diagnosis not present

## 2023-02-21 DIAGNOSIS — I350 Nonrheumatic aortic (valve) stenosis: Secondary | ICD-10-CM | POA: Diagnosis not present

## 2023-02-21 DIAGNOSIS — Z01810 Encounter for preprocedural cardiovascular examination: Secondary | ICD-10-CM | POA: Diagnosis not present

## 2023-02-21 LAB — URINALYSIS, ROUTINE W REFLEX MICROSCOPIC
Bilirubin Urine: NEGATIVE
Glucose, UA: NEGATIVE mg/dL
Hgb urine dipstick: NEGATIVE
Ketones, ur: NEGATIVE mg/dL
Leukocytes,Ua: NEGATIVE
Nitrite: NEGATIVE
Protein, ur: NEGATIVE mg/dL
Specific Gravity, Urine: 1.003 — ABNORMAL LOW (ref 1.005–1.030)
pH: 6 (ref 5.0–8.0)

## 2023-02-21 LAB — CBC
HCT: 34.2 % — ABNORMAL LOW (ref 36.0–46.0)
Hemoglobin: 10.9 g/dL — ABNORMAL LOW (ref 12.0–15.0)
MCH: 27.7 pg (ref 26.0–34.0)
MCHC: 31.9 g/dL (ref 30.0–36.0)
MCV: 86.8 fL (ref 80.0–100.0)
Platelets: 323 10*3/uL (ref 150–400)
RBC: 3.94 MIL/uL (ref 3.87–5.11)
RDW: 14.7 % (ref 11.5–15.5)
WBC: 9.7 10*3/uL (ref 4.0–10.5)
nRBC: 0 % (ref 0.0–0.2)

## 2023-02-21 LAB — COMPREHENSIVE METABOLIC PANEL
ALT: 12 U/L (ref 0–44)
AST: 19 U/L (ref 15–41)
Albumin: 3.5 g/dL (ref 3.5–5.0)
Alkaline Phosphatase: 72 U/L (ref 38–126)
Anion gap: 9 (ref 5–15)
BUN: 8 mg/dL (ref 8–23)
CO2: 25 mmol/L (ref 22–32)
Calcium: 9.3 mg/dL (ref 8.9–10.3)
Chloride: 104 mmol/L (ref 98–111)
Creatinine, Ser: 0.75 mg/dL (ref 0.44–1.00)
GFR, Estimated: >60 mL/min (ref >60)
Glucose, Bld: 104 mg/dL — ABNORMAL HIGH (ref 70–99)
Potassium: 3.6 mmol/L (ref 3.5–5.1)
Sodium: 138 mmol/L (ref 135–145)
Total Bilirubin: 0.5 mg/dL (ref 0.3–1.2)
Total Protein: 8.2 g/dL — ABNORMAL HIGH (ref 6.5–8.1)

## 2023-02-21 LAB — SURGICAL PCR SCREEN
MRSA, PCR: NEGATIVE
Staphylococcus aureus: NEGATIVE

## 2023-02-21 LAB — SARS CORONAVIRUS 2 (TAT 6-24 HRS): SARS Coronavirus 2: NEGATIVE

## 2023-02-21 LAB — PROTIME-INR
INR: 1 (ref 0.8–1.2)
Prothrombin Time: 13.3 s (ref 11.4–15.2)

## 2023-02-21 LAB — GLUCOSE, CAPILLARY: Glucose-Capillary: 150 mg/dL — ABNORMAL HIGH (ref 70–99)

## 2023-02-21 NOTE — Progress Notes (Signed)
Patient signed all consents at PAT lab appointment. CHG soap and instructions were given to patient. CHG surgical prep reviewed with patient and all questions answered.  Patients chart send to anesthesia for review.  

## 2023-02-22 LAB — TYPE AND SCREEN
ABO/RH(D): O POS
Antibody Screen: NEGATIVE

## 2023-02-24 MED ORDER — POTASSIUM CHLORIDE 2 MEQ/ML IV SOLN
80.0000 meq | INTRAVENOUS | Status: DC
  Filled 2023-02-24 (×2): qty 40

## 2023-02-24 MED ORDER — HEPARIN 30,000 UNITS/1000 ML (OHS) CELLSAVER SOLUTION
Status: DC
  Filled 2023-02-24 (×2): qty 1000

## 2023-02-24 MED ORDER — CEFAZOLIN SODIUM-DEXTROSE 2-4 GM/100ML-% IV SOLN
2.0000 g | INTRAVENOUS | Status: AC
Start: 2023-02-25 — End: 2023-02-26
  Administered 2023-02-25: 2 g via INTRAVENOUS
  Filled 2023-02-24 (×2): qty 100

## 2023-02-24 MED ORDER — NOREPINEPHRINE 4 MG/250ML-% IV SOLN
0.0000 ug/min | INTRAVENOUS | Status: DC
  Filled 2023-02-24: qty 250

## 2023-02-24 MED ORDER — MAGNESIUM SULFATE 50 % IJ SOLN
40.0000 meq | INTRAMUSCULAR | Status: DC
  Filled 2023-02-24 (×3): qty 9.85

## 2023-02-24 MED ORDER — DEXMEDETOMIDINE HCL IN NACL 400 MCG/100ML IV SOLN
0.1000 ug/kg/h | INTRAVENOUS | Status: DC
  Filled 2023-02-24: qty 100

## 2023-02-24 NOTE — H&P (Signed)
[address]       [place] [address]             [phone]                                   Jacqueline Rich [facility] Medical Record #[id number] Date of Birth: December 29, 1940   Jacqueline Lerner, DO Jacqueline Ishihara, MD   Chief Complaint: Increasing fatigue     History of Present Illness:     Pt is an 82 yo female who was worked up for increasing fatigue and heart murmur. Pt denies DOE or lightheadedness or CP and had TTE with had normal EF with a mean aortic valve gradient of and with normal RV function also. She was evaluated also by Dr Sanjuana Kava and following cath without CAD and CTA performed which had acceptable anatomy for TAVR, was sent to have surgical consultation. Of note pt does at night feel like she hears wheezing in her chest and causes her to roll around to avoid hearing sounds. Pt lives alone and has no support system             Past Medical History:  Diagnosis Date   Arthritis     Cancer (HCC)      hx ov colon cancer   Diabetes mellitus without complication (HCC)     GERD (gastroesophageal reflux disease)     Hyperlipidemia     Hypertension     Severe aortic stenosis     Stroke (HCC)      mini stroke- no deficits approx 2 years ago   TIA (transient ischemic attack)                 Past Surgical History:  Procedure Laterality Date   ABDOMINAL HYSTERECTOMY       CATARACT EXTRACTION Bilateral 11/27/2020   COLON SURGERY       COLONOSCOPY WITH PROPOFOL N/A 05/17/2022    Procedure: COLONOSCOPY WITH PROPOFOL;  Surgeon: Kathi Der, MD;  Location: [facility];  Service: Gastroenterology;  Laterality: N/A;   RIGHT HEART CATH AND CORONARY ANGIOGRAPHY N/A 02/07/2023    Procedure: RIGHT HEART CATH AND CORONARY ANGIOGRAPHY;  Surgeon: Kathleene Hazel, MD;  Location: [facility];  Service: Cardiovascular;  Laterality: N/A;   right knee replacement        TOTAL KNEE ARTHROPLASTY Left 09/16/2022     Procedure: TOTAL KNEE ARTHROPLASTY;  Surgeon: Joen Laura, MD;  Location: [facility];  Service: Orthopedics;  Laterality: Left;   TOTAL KNEE ARTHROPLASTY Right            Tobacco Use History  Social History        Tobacco Use  Smoking Status Former   Current packs/day: 0.00   Average packs/day: 1 pack/day for 7.0 years (7.0 ttl pk-yrs)   Types: Cigarettes   Start date: 15   Quit date: 73   Years since quitting: 34.8  Smokeless Tobacco Never      Social History        Substance and Sexual Activity  Alcohol Use Yes   Alcohol/week: 2.0 standard drinks of alcohol   Types: 1 Glasses of wine, 1 Cans of beer per week    Comment: occasionally      Social History         Socioeconomic History   Marital status: Single  Spouse name: Not on file   Number of children: 0   Years of education: Not on file   Highest education level: Not on file  Occupational History   Occupation: Retired-Clerical work  Tobacco Use   Smoking status: Former      Current packs/day: 0.00      Average packs/day: 1 pack/day for 7.0 years (7.0 ttl pk-yrs)      Types: Cigarettes      Start date: 66      Quit date: 50      Years since quitting: 34.8   Smokeless tobacco: Never  Vaping Use   Vaping status: Never Used  Substance and Sexual Activity   Alcohol use: Yes      Alcohol/week: 2.0 standard drinks of alcohol      Types: 1 Glasses of wine, 1 Cans of beer per week      Comment: occasionally   Drug use: Never   Sexual activity: Not on file  Other Topics Concern   Not on file  Social History Narrative   Not on file    Social Determinants of Health        Financial Resource Strain: Not on file  Food Insecurity: No Food Insecurity (09/16/2022)    Hunger Vital Sign     Worried About Running Out of Food in the Last Year: Never true     Ran Out of Food in the Last Year: Never true  Transportation Needs: No Transportation Needs (09/16/2022)    PRAPARE - Nutritional therapist (Medical): No     Lack of Transportation (Non-Medical): No  Physical Activity: Not on file  Stress: Not on file  Social Connections: Not on file  Intimate Partner Violence: Not At Risk (09/16/2022)    Humiliation, Afraid, Rape, and Kick questionnaire     Fear of Current or Ex-Partner: No     Emotionally Abused: No     Physically Abused: No     Sexually Abused: No      Allergies       Allergies  Allergen Reactions   Latex Other (See Comments)      Made the hands sore when worn   Nsaids Other (See Comments)      Internal bleeding (Meloxicam is suspected as the reason for today's ER visit)              Current Outpatient Medications  Medication Sig Dispense Refill   amoxicillin (AMOXIL) 500 MG tablet Take 2,000 mg by mouth See admin instructions. Take 4 tablets (2000 mg) by mouth 1 hour prior to dental appointments.       atorvastatin (LIPITOR) 10 MG tablet Take 10 mg by mouth in the morning.       Carboxymethylcellulose Sod PF (THERATEARS PF) 0.25 % SOLN Place 1-2 drops into both eyes 3 (three) times daily as needed (dry/irritated eyes.).       cyanocobalamin (VITAMIN B12) 1000 MCG tablet Take 1,000 mcg by mouth in the morning.       DILT-XR 240 MG 24 hr capsule Take 240 mg by mouth in the morning.       glimepiride (AMARYL) 1 MG tablet Take 1 mg by mouth in the morning.       Lancets (ONETOUCH DELICA PLUS LANCET33G) MISC Apply 1 each topically daily.       lansoprazole (PREVACID) 30 MG capsule Take 30 mg by mouth daily before breakfast.       losartan (  COZAAR) 50 MG tablet Take 50 mg by mouth daily.       metoprolol succinate (TOPROL-XL) 25 MG 24 hr tablet TAKE 1 TABLET(25 MG) BY MOUTH DAILY 90 tablet 3   ONETOUCH ULTRA test strip 1 each daily.       potassium chloride SA (KLOR-CON) 20 MEQ tablet Take 20 mEq by mouth in the morning.       sodium chloride (OCEAN) 0.65 % SOLN nasal spray Place 1 spray into both nostrils as needed for congestion.           No current facility-administered medications for this visit.               Family History  Problem Relation Age of Onset   Cancer - Colon Mother     Hypertension Father     Cancer - Lung Brother     Hypertension Sister     Cancer Brother     Heart attack Brother     Hypertension Brother                  Physical Exam: Teeth in good repair Lungs: clear Card: RR with harsh systolic murmur Ext: no edema Neuro: alert         Diagnostic Studies & Laboratory data: I have personally reviewed the following studies and agree with the findings   TTE (12/2022) IMPRESSIONS     1. Left ventricular ejection fraction, by estimation, is 60 to 65%. The  left ventricle has normal function. The left ventricle has no regional  wall motion abnormalities. Left ventricular diastolic parameters are  indeterminate. Elevated left atrial  pressure. The E/e' is 22.   2. Right ventricular systolic function is normal. The right ventricular  size is normal. There is normal pulmonary artery systolic pressure. The  estimated right ventricular systolic pressure is 29.4 mmHg.   3. The mitral valve is degenerative. Trivial mitral valve regurgitation.  No evidence of mitral stenosis.   4. Native valve, calcified trileaflet, severe / critical aortic stenosis  (max velocity noted at RSB peak velocity 5.22m/s, peak gradient ,  mean gradient , AVA VTI 0.73cm2, DI 0.21).. Aortic valve  regurgitation is not visualized.   5. The inferior vena cava is normal in size with greater than 50%  respiratory variability, suggesting right atrial pressure of 3 mmHg.   6. Rhythm strip during this exam demonstrates normal sinus rhythm.   Comparison(s): Prior study 07/04/2022: LVEF 60-65%, no RWMA, elevated LVEDP,  severe AS (peak velocity 4.62m/s, PG 79.27mmHG, MG , AVA 0.67cm2, DI  0.19).   FINDINGS   Left Ventricle: Left ventricular ejection fraction, by estimation, is 60  to 65%. The left  ventricle has normal function. The left ventricle has no  regional wall motion abnormalities. The left ventricular internal cavity  size was normal in size. There is   no left ventricular hypertrophy. Left ventricular diastolic parameters  are indeterminate. Elevated left atrial pressure. The E/e' is 22.   Right Ventricle: The right ventricular size is normal. No increase in  right ventricular wall thickness. Right ventricular systolic function is  normal. There is normal pulmonary artery systolic pressure. The tricuspid  regurgitant velocity is 2.57 m/s, and   with an assumed right atrial pressure of 3 mmHg, the estimated right  ventricular systolic pressure is 29.4 mmHg.   Left Atrium: Left atrial size was normal in size.   Right Atrium: Right atrial size was normal in size.   Pericardium: There is no  evidence of pericardial effusion.   Mitral Valve: The mitral valve is degenerative in appearance. Mild to  moderate mitral annular calcification. Trivial mitral valve regurgitation.  No evidence of mitral valve stenosis.   Tricuspid Valve: The tricuspid valve is grossly normal. Tricuspid valve  regurgitation is trivial. No evidence of tricuspid stenosis.   Aortic Valve: Native valve, calcified trileaflet, severe / critical aortic  stenosis (max velocity noted at RSB peak velocity 5.28m/s, peak gradient  , mean gradient , AVA VTI 0.73cm2, DI 0.21). Aortic valve  regurgitation is not visualized.  Aortic valve mean gradient measures 63.0 mmHg. Aortic valve peak gradient  measures 108.6 mmHg. Aortic valve area, by VTI measures 0.73 cm.   Pulmonic Valve: The pulmonic valve was normal in structure. Pulmonic valve  regurgitation is trivial. No evidence of pulmonic stenosis.   Aorta: The aortic root and ascending aorta are structurally normal, with  no evidence of dilitation.   Venous: The inferior vena cava is normal in size with greater than 50%  respiratory  variability, suggesting right atrial pressure of 3 mmHg.   IAS/Shunts: The atrial septum is grossly normal.   EKG: Rhythm strip during this exam demonstrates normal sinus rhythm.     LEFT VENTRICLE  PLAX 2D  LVIDd:         3.90 cm   Diastology  LVIDs:         2.60 cm   LV e' medial:    4.24 cm/s  LV PW:         1.00 cm   LV E/e' medial:  24.3  LV IVS:        1.00 cm   LV e' lateral:   5.22 cm/s  LVOT diam:     2.10 cm   LV E/e' lateral: 19.7  LV SV:         90  LV SV Index:   50  LVOT Area:     3.46 cm     RIGHT VENTRICLE  RV S prime:     14.70 cm/s  TAPSE (M-mode): 2.3 cm   LEFT ATRIUM             Index        RIGHT ATRIUM           Index  LA diam:        4.10 cm 2.26 cm/m   RA Area:     16.00 cm  LA Vol (A2C):   63.7 ml 35.06 ml/m  RA Volume:   40.70 ml  22.40 ml/m  LA Vol (A4C):   51.9 ml 28.57 ml/m  LA Biplane Vol: 58.1 ml 31.98 ml/m   AORTIC VALVE  AV Area (Vmax):    0.68 cm  AV Area (Vmean):   0.72 cm  AV Area (VTI):     0.73 cm  AV Vmax:           521.00 cm/s  AV Vmean:          375.000 cm/s  AV VTI:            1.230 m  AV Peak Grad:      108.6 mmHg  AV Mean Grad:      63.0 mmHg  LVOT Vmax:         103.00 cm/s  LVOT Vmean:        78.000 cm/s  LVOT VTI:          0.260 m  LVOT/AV VTI ratio: 0.21  AORTA  Ao Root diam: 2.70 cm  Ao Asc diam:  3.50 cm   MITRAL VALVE                TRICUSPID VALVE  MV Area (PHT): 2.75 cm     TR Peak grad:   26.4 mmHg  MV Decel Time: 276 msec     TR Vmax:        257.00 cm/s  MV E velocity: 103.00 cm/s  MV A velocity: 154.00 cm/s  SHUNTS  MV E/A ratio:  0.67         Systemic VTI:  0.26 m                              Systemic Diam: 2.10 cm    CATH (01/2023) Conclusion       Prox RCA lesion is 30% stenosed.   Mid RCA lesion is 50% stenosed.   Ost Cx to Prox Cx lesion is 40% stenosed.   Ost LAD to Prox LAD lesion is 40% stenosed.   Mid LAD to Dist LAD lesion is 20% stenosed.   Mild to moderate non-obstructive  CAD Normal right heart pressures Hemo Data   Flowsheet Row Most Recent Value  Fick Cardiac Output 6.58 L/min  Fick Cardiac Output Index 3.69 (L/min)/BSA  RA A Wave 8 mmHg  RA V Wave 5 mmHg  RA Mean 4 mmHg  RV Systolic Pressure 32 mmHg  RV Diastolic Pressure 1 mmHg  RV EDP 8 mmHg  PA Systolic Pressure 27 mmHg  PA Diastolic Pressure 13 mmHg  PA Mean 18 mmHg  PW A Wave 18 mmHg  PW V Wave 13 mmHg  PW Mean 10 mmHg  AO Systolic Pressure 149 mmHg  AO Diastolic Pressure 70 mmHg  AO Mean 102 mmHg  QP/QS 1  TPVR Index 4.88 HRUI  TSVR Index 27.64 HRUI  PVR SVR Ratio 0.08  TPVR/TSVR Ratio 0.18     Recent Radiology Findings:   CTA (01/2023) FINDINGS: Image quality: Excellent.   Noise artifact is: Limited.   Valve Morphology: Tricuspid aortic valve with severe calcifications and severely restricted leaflet motion in systole. Bulky calcification of the LCC.   Aortic Valve Calcium score: 2493   Aortic annular dimension:   Phase assessed: 30%   Annular area: 367 mm2   Annular perimeter: 69.1 mm   Max diameter: 23.8 mm   Min diameter: 20.1 mm   Annular and subannular calcification: None.   Membranous septum length: 3.9 mm   Optimal coplanar projection: LAO 30 CRA 44   Coronary Artery Height above Annulus:   Left Main: 13.7 mm   Right Coronary: 14.3 mm   Sinus of Valsalva Measurements:   Non-coronary: 31 mm   Right-coronary: 29 mm   Left-coronary: 31 mm   Sinus of Valsalva Height:   Non-coronary: 22.0 mm   Right-coronary: 18.6 mm   Left-coronary: 20.4 mm   Sinotubular Junction: 29 mm   Ascending Thoracic Aorta: 35 mm   Coronary Arteries: Normal coronary origin. Right dominance. The study was performed without use of NTG and is insufficient for plaque evaluation. Please refer to recent cardiac catheterization for coronary assessment. Severe 3-vessel coronary calcifications.   Cardiac Morphology:   Right Atrium: Right atrial size is within normal  limits.   Right Ventricle: The right ventricular cavity is within normal limits.   Left Atrium: Left atrial size is normal in size with no left atrial appendage  filling defect.   Left Ventricle: The ventricular cavity size is within normal limits.   Pulmonary arteries: Normal in size without proximal filling defect.   Pulmonary veins: Normal pulmonary venous drainage.   Pericardium: Normal thickness with no significant effusion or calcium present.   Mitral Valve: The mitral valve is normal structure without significant calcification.   Extra-cardiac findings: See attached radiology report for non-cardiac structures.   IMPRESSION: 1. Annular measurements support a 23 mm S3 or 26 mm Evolut Pro.   2. No significant annular or subannular calcifications.   3. Sufficient coronary to annulus distance.   4. Optimal Fluoroscopic Angle for Delivery: LAO 30 CRA 44       Recent Lab Findings: Recent Labs       Lab Results  Component Value Date    WBC 8.9 01/28/2023    HGB 10.5 (L) 02/07/2023    HCT 31.0 (L) 02/07/2023    PLT 354 01/28/2023    GLUCOSE 93 01/28/2023    CHOL 132 05/17/2022    TRIG 129 05/17/2022    HDL 35 (L) 05/17/2022    LDLCALC 71 05/17/2022    ALT 13 09/04/2022    AST 16 09/04/2022    NA 143 02/07/2023    K 3.8 02/07/2023    CL 103 01/28/2023    CREATININE 0.72 01/28/2023    BUN 12 01/28/2023    CO2 23 01/28/2023    INR 1.1 05/15/2022    HGBA1C 6.5 (H) 09/04/2022            Assessment / Plan:     82 yo female with NYHA class 1 symptoms of severe AS with normal LV function and no CAD. Pt has a class I indication for AVR and at her age would best be served with TAVR with a 23 mm sapien via the femoral approach. We discussed the natural history of AS and she understands the risks, goals and recovery from TAVR and wishes to proceed. We also discussed if the need for bailout with emergency open heart surgery was needed, she would be a candidate.

## 2023-02-25 ENCOUNTER — Inpatient Hospital Stay (HOSPITAL_COMMUNITY): Payer: Medicare Other | Admitting: Anesthesiology

## 2023-02-25 ENCOUNTER — Other Ambulatory Visit: Payer: Self-pay

## 2023-02-25 ENCOUNTER — Inpatient Hospital Stay (HOSPITAL_COMMUNITY)
Admission: RE | Admit: 2023-02-25 | Discharge: 2023-02-26 | DRG: 267 | Disposition: A | Discharge status: Home / Self Care | Payer: Medicare Other | Attending: Internal Medicine | Admitting: Internal Medicine

## 2023-02-25 ENCOUNTER — Inpatient Hospital Stay (HOSPITAL_COMMUNITY): Payer: Self-pay | Admitting: Physician Assistant

## 2023-02-25 ENCOUNTER — Encounter (HOSPITAL_COMMUNITY)
Admission: RE | Disposition: A | Discharge status: Home / Self Care | Payer: Self-pay | Source: Home / Self Care | Attending: Internal Medicine

## 2023-02-25 ENCOUNTER — Other Ambulatory Visit: Payer: Self-pay | Admitting: Cardiology

## 2023-02-25 ENCOUNTER — Encounter (HOSPITAL_COMMUNITY): Payer: Self-pay | Admitting: Internal Medicine

## 2023-02-25 ENCOUNTER — Inpatient Hospital Stay (HOSPITAL_COMMUNITY): Payer: Medicare Other

## 2023-02-25 DIAGNOSIS — Z952 Presence of prosthetic heart valve: Secondary | ICD-10-CM | POA: Diagnosis not present

## 2023-02-25 DIAGNOSIS — Z85038 Personal history of other malignant neoplasm of large intestine: Secondary | ICD-10-CM | POA: Diagnosis not present

## 2023-02-25 DIAGNOSIS — I2510 Atherosclerotic heart disease of native coronary artery without angina pectoris: Secondary | ICD-10-CM | POA: Diagnosis not present

## 2023-02-25 DIAGNOSIS — Z006 Encounter for examination for normal comparison and control in clinical research program: Secondary | ICD-10-CM

## 2023-02-25 DIAGNOSIS — E785 Hyperlipidemia, unspecified: Secondary | ICD-10-CM | POA: Diagnosis not present

## 2023-02-25 DIAGNOSIS — I472 Ventricular tachycardia, unspecified: Secondary | ICD-10-CM | POA: Diagnosis present

## 2023-02-25 DIAGNOSIS — Z7984 Long term (current) use of oral hypoglycemic drugs: Secondary | ICD-10-CM | POA: Diagnosis not present

## 2023-02-25 DIAGNOSIS — I10 Essential (primary) hypertension: Secondary | ICD-10-CM | POA: Diagnosis not present

## 2023-02-25 DIAGNOSIS — E1169 Type 2 diabetes mellitus with other specified complication: Secondary | ICD-10-CM | POA: Diagnosis not present

## 2023-02-25 DIAGNOSIS — Z96653 Presence of artificial knee joint, bilateral: Secondary | ICD-10-CM | POA: Diagnosis present

## 2023-02-25 DIAGNOSIS — Z8249 Family history of ischemic heart disease and other diseases of the circulatory system: Secondary | ICD-10-CM

## 2023-02-25 DIAGNOSIS — I440 Atrioventricular block, first degree: Secondary | ICD-10-CM | POA: Diagnosis not present

## 2023-02-25 DIAGNOSIS — Z794 Long term (current) use of insulin: Secondary | ICD-10-CM | POA: Diagnosis not present

## 2023-02-25 DIAGNOSIS — E119 Type 2 diabetes mellitus without complications: Secondary | ICD-10-CM | POA: Diagnosis not present

## 2023-02-25 DIAGNOSIS — Z79899 Other long term (current) drug therapy: Secondary | ICD-10-CM

## 2023-02-25 DIAGNOSIS — Z8673 Personal history of transient ischemic attack (TIA), and cerebral infarction without residual deficits: Secondary | ICD-10-CM | POA: Diagnosis not present

## 2023-02-25 DIAGNOSIS — Z7982 Long term (current) use of aspirin: Secondary | ICD-10-CM

## 2023-02-25 DIAGNOSIS — Z87891 Personal history of nicotine dependence: Secondary | ICD-10-CM | POA: Diagnosis not present

## 2023-02-25 DIAGNOSIS — I350 Nonrheumatic aortic (valve) stenosis: Secondary | ICD-10-CM

## 2023-02-25 DIAGNOSIS — Z1152 Encounter for screening for COVID-19: Secondary | ICD-10-CM | POA: Diagnosis not present

## 2023-02-25 DIAGNOSIS — I4729 Other ventricular tachycardia: Secondary | ICD-10-CM | POA: Insufficient documentation

## 2023-02-25 DIAGNOSIS — Z90710 Acquired absence of both cervix and uterus: Secondary | ICD-10-CM

## 2023-02-25 DIAGNOSIS — M1990 Unspecified osteoarthritis, unspecified site: Secondary | ICD-10-CM | POA: Diagnosis present

## 2023-02-25 DIAGNOSIS — K219 Gastro-esophageal reflux disease without esophagitis: Secondary | ICD-10-CM | POA: Diagnosis not present

## 2023-02-25 HISTORY — DX: Presence of prosthetic heart valve: Z95.2

## 2023-02-25 HISTORY — PX: INTRAOPERATIVE TRANSTHORACIC ECHOCARDIOGRAM: SHX6523

## 2023-02-25 LAB — POCT I-STAT 7, (LYTES, BLD GAS, ICA,H+H)
Acid-base deficit: 2 mmol/L (ref 0.0–2.0)
Bicarbonate: 23.6 mmol/L (ref 20.0–28.0)
Calcium, Ion: 1.28 mmol/L (ref 1.15–1.40)
HCT: 29 % — ABNORMAL LOW (ref 36.0–46.0)
Hemoglobin: 9.9 g/dL — ABNORMAL LOW (ref 12.0–15.0)
O2 Saturation: 96 %
Potassium: 3.8 mmol/L (ref 3.5–5.1)
Sodium: 138 mmol/L (ref 135–145)
TCO2: 25 mmol/L (ref 22–32)
pCO2 arterial: 43.1 mm[Hg] (ref 32–48)
pH, Arterial: 7.346 — ABNORMAL LOW (ref 7.35–7.45)
pO2, Arterial: 84 mm[Hg] (ref 83–108)

## 2023-02-25 LAB — POCT I-STAT, CHEM 8
BUN: 11 mg/dL (ref 8–23)
BUN: 9 mg/dL (ref 8–23)
Calcium, Ion: 1.22 mmol/L (ref 1.15–1.40)
Calcium, Ion: 1.1 mmol/L — ABNORMAL LOW (ref 1.15–1.40)
Chloride: 102 mmol/L (ref 98–111)
Chloride: 106 mmol/L (ref 98–111)
Creatinine, Ser: 0.8 mg/dL (ref 0.44–1.00)
Creatinine, Ser: 0.6 mg/dL (ref 0.44–1.00)
Glucose, Bld: 148 mg/dL — ABNORMAL HIGH (ref 70–99)
Glucose, Bld: 138 mg/dL — ABNORMAL HIGH (ref 70–99)
HCT: 27 % — ABNORMAL LOW (ref 36.0–46.0)
HCT: 25 % — ABNORMAL LOW (ref 36.0–46.0)
Hemoglobin: 9.2 g/dL — ABNORMAL LOW (ref 12.0–15.0)
Hemoglobin: 8.5 g/dL — ABNORMAL LOW (ref 12.0–15.0)
Potassium: 4 mmol/L (ref 3.5–5.1)
Potassium: 3.6 mmol/L (ref 3.5–5.1)
Sodium: 140 mmol/L (ref 135–145)
Sodium: 140 mmol/L (ref 135–145)
TCO2: 24 mmol/L (ref 22–32)
TCO2: 21 mmol/L — ABNORMAL LOW (ref 22–32)

## 2023-02-25 LAB — GLUCOSE, CAPILLARY
Glucose-Capillary: 117 mg/dL — ABNORMAL HIGH (ref 70–99)
Glucose-Capillary: 108 mg/dL — ABNORMAL HIGH (ref 70–99)
Glucose-Capillary: 77 mg/dL (ref 70–99)
Glucose-Capillary: 98 mg/dL (ref 70–99)

## 2023-02-25 LAB — ECHOCARDIOGRAM LIMITED
AR max vel: 3.29 cm2
AV Area VTI: 3.4 cm2
AV Area mean vel: 3.18 cm2
AV Mean grad: 5 mm[Hg]
AV Peak grad: 8.9 mm[Hg]
Ao pk vel: 1.49 m/s
S' Lateral: 2.6 cm

## 2023-02-25 SURGERY — TRANSCATHETER AORTIC VALVE REPLACEMENT, TRANSFEMORAL (CATHLAB)
Anesthesia: Monitor Anesthesia Care

## 2023-02-25 MED ORDER — ACETAMINOPHEN 325 MG PO TABS: 650.0000 mg | ORAL_TABLET | Freq: Four times a day (QID) | ORAL | Status: DC | PRN

## 2023-02-25 MED ORDER — FENTANYL CITRATE (PF) 100 MCG/2ML IJ SOLN
INTRAMUSCULAR | Status: DC | PRN
  Administered 2023-02-25 (×4): 25 ug via INTRAVENOUS

## 2023-02-25 MED ORDER — NITROGLYCERIN IN D5W 200-5 MCG/ML-% IV SOLN: 0.0000 ug/min | INTRAVENOUS | Status: DC

## 2023-02-25 MED ORDER — SODIUM CHLORIDE 0.9 % IV SOLN: 250.0000 mL | INTRAVENOUS | Status: DC | PRN

## 2023-02-25 MED ORDER — LIDOCAINE HCL (PF) 1 % IJ SOLN
INTRAMUSCULAR | Status: DC | PRN
  Administered 2023-02-25: 2 mL
  Administered 2023-02-25 (×3): 5 mL

## 2023-02-25 MED ORDER — ASPIRIN 81 MG PO CHEW
81.0000 mg | CHEWABLE_TABLET | Freq: Every day | ORAL | Status: DC
  Administered 2023-02-25 – 2023-02-26 (×2): 81 mg via ORAL
  Filled 2023-02-25 (×2): qty 1

## 2023-02-25 MED ORDER — INSULIN ASPART 100 UNIT/ML IJ SOLN: 0.0000 [IU] | Freq: Three times a day (TID) | INTRAMUSCULAR | Status: DC

## 2023-02-25 MED ORDER — NOREPINEPHRINE BITARTRATE 1 MG/ML IV SOLN
INTRAVENOUS | Status: DC | PRN
  Administered 2023-02-25: 8 mL via INTRAVENOUS

## 2023-02-25 MED ORDER — ONDANSETRON HCL 4 MG/2ML IJ SOLN: 4.0000 mg | Freq: Four times a day (QID) | INTRAMUSCULAR | Status: DC | PRN

## 2023-02-25 MED ORDER — SODIUM CHLORIDE 0.9 % IV SOLN
INTRAVENOUS | Status: DC
Start: 2023-02-25 — End: 2023-02-25

## 2023-02-25 MED ORDER — PANTOPRAZOLE SODIUM 20 MG PO TBEC
20.0000 mg | DELAYED_RELEASE_TABLET | Freq: Every day | ORAL | Status: DC
  Administered 2023-02-25 – 2023-02-26 (×2): 20 mg via ORAL
  Filled 2023-02-25 (×2): qty 1

## 2023-02-25 MED ORDER — OXYCODONE HCL 5 MG PO TABS: 5.0000 mg | ORAL_TABLET | ORAL | Status: DC | PRN

## 2023-02-25 MED ORDER — POTASSIUM CHLORIDE CRYS ER 20 MEQ PO TBCR
20.0000 meq | EXTENDED_RELEASE_TABLET | Freq: Every morning | ORAL | Status: DC
  Administered 2023-02-26: 20 meq via ORAL
  Filled 2023-02-25: qty 1

## 2023-02-25 MED ORDER — CEFAZOLIN SODIUM-DEXTROSE 2-4 GM/100ML-% IV SOLN
2.0000 g | Freq: Three times a day (TID) | INTRAVENOUS | Status: AC
Start: 2023-02-25 — End: 2023-02-26
  Administered 2023-02-25 – 2023-02-26 (×2): 2 g via INTRAVENOUS
  Filled 2023-02-25 (×2): qty 100

## 2023-02-25 MED ORDER — SODIUM CHLORIDE 0.9% FLUSH
3.0000 mL | Freq: Two times a day (BID) | INTRAVENOUS | Status: DC
  Administered 2023-02-26: 3 mL via INTRAVENOUS

## 2023-02-25 MED ORDER — OXYCODONE HCL 5 MG PO TABS: 5.0000 mg | ORAL_TABLET | Freq: Once | ORAL | Status: DC | PRN

## 2023-02-25 MED ORDER — PROTAMINE SULFATE 10 MG/ML IV SOLN
INTRAVENOUS | Status: DC | PRN
  Administered 2023-02-25: 100 mg via INTRAVENOUS

## 2023-02-25 MED ORDER — DEXMEDETOMIDINE HCL IN NACL 80 MCG/20ML IV SOLN
INTRAVENOUS | Status: DC | PRN
  Administered 2023-02-25: 1 ug/kg/h via INTRAVENOUS
  Administered 2023-02-25: 36.76 ug via INTRAVENOUS

## 2023-02-25 MED ORDER — PROPOFOL 10 MG/ML IV BOLUS
INTRAVENOUS | Status: DC | PRN
  Administered 2023-02-25: 30 mg via INTRAVENOUS
  Administered 2023-02-25: 10 mg via INTRAVENOUS

## 2023-02-25 MED ORDER — HEPARIN (PORCINE) IN NACL 2000-0.9 UNIT/L-% IV SOLN
INTRAVENOUS | Status: DC | PRN
  Administered 2023-02-25: 1000 mL

## 2023-02-25 MED ORDER — INSULIN ASPART 100 UNIT/ML IJ SOLN: 0.0000 [IU] | INTRAMUSCULAR | Status: DC | PRN

## 2023-02-25 MED ORDER — ONDANSETRON HCL 4 MG/2ML IJ SOLN: 4.0000 mg | Freq: Once | INTRAMUSCULAR | Status: DC | PRN

## 2023-02-25 MED ORDER — LACTATED RINGERS IV SOLN: INTRAVENOUS | Status: DC | PRN

## 2023-02-25 MED ORDER — SODIUM CHLORIDE 0.9 % IV SOLN
INTRAVENOUS | Status: AC
  Administered 2023-02-25: 50 mL/h via INTRAVENOUS

## 2023-02-25 MED ORDER — LIDOCAINE HCL (PF) 1 % IJ SOLN
INTRAMUSCULAR | Status: AC
  Filled 2023-02-25: qty 30

## 2023-02-25 MED ORDER — CHLORHEXIDINE GLUCONATE 0.12 % MT SOLN
15.0000 mL | Freq: Once | OROMUCOSAL | Status: AC
  Administered 2023-02-25: 15 mL via OROMUCOSAL
  Filled 2023-02-25 (×2): qty 15

## 2023-02-25 MED ORDER — CHLORHEXIDINE GLUCONATE 4 % EX SOLN
60.0000 mL | Freq: Once | CUTANEOUS | Status: DC
  Filled 2023-02-25: qty 60

## 2023-02-25 MED ORDER — ATORVASTATIN CALCIUM 10 MG PO TABS
10.0000 mg | ORAL_TABLET | Freq: Every morning | ORAL | Status: DC
  Administered 2023-02-26: 10 mg via ORAL
  Filled 2023-02-25: qty 1

## 2023-02-25 MED ORDER — MORPHINE SULFATE (PF) 2 MG/ML IV SOLN: 1.0000 mg | INTRAVENOUS | Status: DC | PRN

## 2023-02-25 MED ORDER — HEPARIN SODIUM (PORCINE) 1000 UNIT/ML IJ SOLN
INTRAMUSCULAR | Status: DC | PRN
  Administered 2023-02-25: 11000 [IU] via INTRAVENOUS

## 2023-02-25 MED ORDER — IODIXANOL 320 MG/ML IV SOLN
INTRAVENOUS | Status: DC | PRN
  Administered 2023-02-25: 50 mL via INTRA_ARTERIAL

## 2023-02-25 MED ORDER — CHLORHEXIDINE GLUCONATE 4 % EX SOLN
30.0000 mL | CUTANEOUS | Status: DC
Start: 2023-02-25 — End: 2023-02-25

## 2023-02-25 MED ORDER — ACETAMINOPHEN 650 MG RE SUPP: 650.0000 mg | Freq: Four times a day (QID) | RECTAL | Status: DC | PRN

## 2023-02-25 MED ORDER — ACETAMINOPHEN 500 MG PO TABS
1000.0000 mg | ORAL_TABLET | Freq: Once | ORAL | Status: AC
Start: 2023-02-25 — End: 2023-02-25
  Administered 2023-02-25: 1000 mg via ORAL
  Filled 2023-02-25: qty 2

## 2023-02-25 MED ORDER — TRAMADOL HCL 50 MG PO TABS: 50.0000 mg | ORAL_TABLET | ORAL | Status: DC | PRN

## 2023-02-25 MED ORDER — OXYCODONE HCL 5 MG/5ML PO SOLN: 5.0000 mg | Freq: Once | ORAL | Status: DC | PRN

## 2023-02-25 MED ORDER — FENTANYL CITRATE (PF) 100 MCG/2ML IJ SOLN: 25.0000 ug | INTRAMUSCULAR | Status: DC | PRN

## 2023-02-25 MED ORDER — SODIUM CHLORIDE 0.9% FLUSH: 3.0000 mL | INTRAVENOUS | Status: DC | PRN

## 2023-02-25 SURGICAL SUPPLY — 38 items
BAG SNAP BAND KOVER 36X36 (MISCELLANEOUS) ×2 IMPLANT
BALLN MUSTANG 12.0X40 75 (BALLOONS) ×1
BALLN MUSTANG 12X60X135 (BALLOONS) ×1
BALLOON MUSTANG 12.0X40 75 (BALLOONS) IMPLANT
BALLOON MUSTANG 12X60X135 (BALLOONS) IMPLANT
CABLE ADAPT PACING TEMP 12FT (ADAPTER) IMPLANT
CABLE SURGICAL S-101-97-12 (CABLE) IMPLANT
CATH 23 ULTRA DELIVERY (CATHETERS) IMPLANT
CATH DIAG 6FR JR4 (CATHETERS) IMPLANT
CATH DIAG 6FR PIGTAIL ANGLED (CATHETERS) IMPLANT
CATH INFINITI 5FR ANG PIGTAIL (CATHETERS) IMPLANT
CATH INFINITI 6F AL1 (CATHETERS) IMPLANT
CATH INFINITI 6F AL2 (CATHETERS) IMPLANT
CLOSURE MYNX CONTROL 6F/7F (Vascular Products) IMPLANT
CLOSURE PERCLOSE PROSTYLE (VASCULAR PRODUCTS) IMPLANT
CRIMPER (MISCELLANEOUS) IMPLANT
DEVICE INFLATION ATRION QL2530 (MISCELLANEOUS) IMPLANT
KIT ENCORE 40 (KITS) IMPLANT
KIT SAPIAN 3 ULTRA RESILIA 23 (Valve) IMPLANT
PACK CARDIAC CATHETERIZATION (CUSTOM PROCEDURE TRAY) ×1 IMPLANT
SET ATX-X65L (MISCELLANEOUS) IMPLANT
SHEATH INTRODUCER SET 20-26 (SHEATH) IMPLANT
SHEATH PINNACLE 6F 10CM (SHEATH) IMPLANT
SHEATH PINNACLE 8F 10CM (SHEATH) IMPLANT
SHEATH PROBE COVER 6X72 (BAG) IMPLANT
SHIELD CATH-GARD CONTAMINATION (MISCELLANEOUS) IMPLANT
STOPCOCK MORSE 400PSI 3WAY (MISCELLANEOUS) ×2 IMPLANT
SYR CONTROL 10ML ANGIOGRAPHIC (SYRINGE) IMPLANT
TUBING ART PRESS 72 MALE/FEM (TUBING) IMPLANT
TUBING CIL FLEX 10 FLL-RA (TUBING) IMPLANT
WIRE AMPLATZ SS-J .035X180CM (WIRE) IMPLANT
WIRE EMERALD 3MM-J .035X150CM (WIRE) IMPLANT
WIRE EMERALD 3MM-J .035X260CM (WIRE) IMPLANT
WIRE EMERALD ST .035X260CM (WIRE) IMPLANT
WIRE G STIF.035X180 STR (WIRE) IMPLANT
WIRE MICRO SET SILHO 5FR 7 (SHEATH) IMPLANT
WIRE PACING TEMP ST TIP 5 (CATHETERS) IMPLANT
WIRE SAFARI SM CURVE 275 (WIRE) IMPLANT

## 2023-02-25 NOTE — Progress Notes (Signed)
  [facility]   MULTIDISCIPLINARY HEART VALVE TEAM  Patient doing well s/p TAVR. She is hemodynamically stable. Groin sites stable. 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 Transferred from cath lab holding to 4E. Early ambulation after bedrest completed and hopeful discharge over the next 24-48 hours.   Cline Crock PA-C  [facility]  Pager [phone]

## 2023-02-25 NOTE — Progress Notes (Signed)
  Echocardiogram 2D Echocardiogram has been performed.  Jacqueline Rich 02/25/2023, 12:07 PM

## 2023-02-25 NOTE — Anesthesia Postprocedure Evaluation (Signed)
Anesthesia Post Note  Patient: Jacqueline Rich  Procedure(s) Performed: Transcatheter Aortic Valve Replacement, Transfemoral INTRAOPERATIVE TRANSTHORACIC ECHOCARDIOGRAM     Patient location during evaluation: PACU Anesthesia Type: MAC Level of consciousness: awake and alert Pain management: pain level controlled Vital Signs Assessment: post-procedure vital signs reviewed and stable Respiratory status: spontaneous breathing, nonlabored ventilation and respiratory function stable Cardiovascular status: stable and blood pressure returned to baseline Anesthetic complications: no   There were no known notable events for this encounter.  Last Vitals:  Vitals:   02/25/23 1415 02/25/23 1430  BP: (!) 104/52 (!) 97/57  Pulse: (!) 48 (!) 48  Resp: 13 13  Temp:    SpO2: 98% 98%    Last Pain:  Vitals:   02/25/23 1300  TempSrc: Temporal  PainSc:                  Beryle Lathe

## 2023-02-25 NOTE — Anesthesia Preprocedure Evaluation (Addendum)
Anesthesia Evaluation  Patient identified by MRN, date of birth, ID band Patient awake    Reviewed: Allergy & Precautions, NPO status , Patient's Chart, lab work & pertinent test results, reviewed documented beta blocker date and time   History of Anesthesia Complications Negative for: history of anesthetic complications  Airway Mallampati: II  TM Distance: >3 FB Neck ROM: Full    Dental  (+) Dental Advisory Given, Edentulous Upper   Pulmonary former smoker   Pulmonary exam normal        Cardiovascular hypertension, Pt. on home beta blockers and Pt. on medications + Valvular Problems/Murmurs AS  Rhythm:Regular Rate:Normal + Systolic murmurs  '24 Cath -   Prox RCA lesion is 30% stenosed.   Mid RCA lesion is 50% stenosed.   Ost Cx to Prox Cx lesion is 40% stenosed.   Ost LAD to Prox LAD lesion is 40% stenosed.   Mid LAD to Dist LAD lesion is 20% stenosed.  '24 TTE - EF 60 to 65%. Trivial mitral valve regurgitation. Native valve, calcified trileaflet, severe / critical aortic stenosis (max velocity noted at RSB peak velocity 5.70m/s, peak gradient , mean gradient , AVA VTI 0.73cm2, DI 0.21). Aortic valve regurgitation is not visualized.      Neuro/Psych TIA negative psych ROS   GI/Hepatic Neg liver ROS,GERD  Medicated and Controlled,,  Endo/Other  diabetes, Type 2, Oral Hypoglycemic Agents    Renal/GU negative Renal ROS     Musculoskeletal  (+) Arthritis ,    Abdominal   Peds  Hematology  (+) Blood dyscrasia, anemia   Anesthesia Other Findings   Reproductive/Obstetrics                             Anesthesia Physical Anesthesia Plan  ASA: 4  Anesthesia Plan: MAC   Post-op Pain Management: Tylenol PO (pre-op)*   Induction:   PONV Risk Score and Plan: 2 and Propofol infusion and Treatment may vary due to age or medical condition  Airway Management Planned: Natural  Airway and Simple Face Mask  Additional Equipment: Arterial line  Intra-op Plan:   Post-operative Plan:   Informed Consent: I have reviewed the patients History and Physical, chart, labs and discussed the procedure including the risks, benefits and alternatives for the proposed anesthesia with the patient or authorized representative who has indicated his/her understanding and acceptance.       Plan Discussed with: CRNA and Anesthesiologist  Anesthesia Plan Comments:         Anesthesia Quick Evaluation

## 2023-02-25 NOTE — Op Note (Signed)
[facility]   MULTIDISCIPLINARY HEART VALVE TEAM   TAVR OPERATIVE NOTE   Date of Procedure:  02/25/2023  Preoperative Diagnosis: Severe Aortic Stenosis   Postoperative Diagnosis: Same   Procedure:   Transcatheter Aortic Valve Replacement - Percutaneous right Transfemoral Approach  Edwards Sapien 3 Ultra THV (size 23 mm, model # 9755RSL)   Co-Surgeons:  Eugenio Hoes MD and Alverda Skeans, MD   Anesthesiologist:  Sherril Cong MD  Echocardiographer:  Thurmon Fair MD  Pre-operative Echo Findings: Severe aortic stenosis normal left ventricular systolic function  Post-operative Echo Findings: trivial paravalvular leak normal left ventricular systolic function   BRIEF CLINICAL NOTE AND INDICATIONS FOR SURGERY  82 yo female with NYHA class 1 symptoms of severe AS with normal LV function and no CAD. Pt has a class I indication for AVR and at her age would best be served with TAVR with a 23 mm sapien via the femoral approach.     DETAILS OF THE OPERATIVE PROCEDURE  PREPARATION:    The patient was brought to the operating room on the above mentioned date and appropriate monitoring was established by the anesthesia team. The patient was placed in the supine position on the operating table.  Intravenous antibiotics were administered. The patient was monitored closely throughout the procedure under conscious sedation.   Baseline transthoracic echocardiogram was performed. The patient's abdomen and both groins were prepped and draped in a sterile manner. A time out procedure was performed.   PERIPHERAL ACCESS:    Using the modified Seldinger technique, femoral arterial and venous access was obtained with placement of 6 Fr sheaths on the left and right side and the RIJ.  A pigtail diagnostic catheter was passed through the left arterial sheath under fluoroscopic guidance into the aortic root.  A temporary transvenous pacemaker catheter was passed through the RIJ   venous sheath under fluoroscopic guidance into the right ventricle.  The pacemaker was tested to ensure stable lead placement and pacemaker capture. Aortic root angiography was performed in order to determine the optimal angiographic angle for valve deployment.   TRANSFEMORAL ACCESS:   Percutaneous transfemoral access and sheath placement was performed using ultrasound guidance.  The right common femoral artery was cannulated using a micropuncture needle and appropriate location was verified using hand injection angiogram.  A pair of Abbott Perclose percutaneous closure devices were placed and a 6 French sheath replaced into the femoral artery.  The patient was heparinized systemically and ACT verified > 250 seconds.    A 14 Fr transfemoral E-sheath was introduced into the right common femoral artery after progressively dilating over an Amplatz superstiff wire. An AL2 catheter was used to direct a straight-tip exchange length wire across the native aortic valve into the left ventricle. This was exchanged out for a pigtail catheter and position was confirmed in the LV apex. Simultaneous LV and Ao pressures were recorded. The mean gradient was The pigtail catheter was exchanged for a Safari wire in the LV apex.   BALLOON AORTIC VALVULOPLASTY:   Balloon aortic valvuloplasty was performed using a 12 mm valvuloplasty balloon.  Once optimal position was achieved, BAV was done under rapid ventricular pacing. The patient recovered well hemodynamically.    TRANSCATHETER HEART VALVE DEPLOYMENT:   An Edwards Sapien 3 Ultra transcatheter heart valve (size 23 mm) was prepared and crimped per manufacturer's guidelines, and the proper orientation of the valve is confirmed on the [employer] delivery system. The valve was advanced through the introducer sheath  using normal technique until in an appropriate position in the abdominal aorta beyond the sheath tip. The balloon was then retracted and using  the fine-tuning wheel was centered on the valve. The valve was then advanced across the aortic arch using appropriate flexion of the catheter. The valve was carefully positioned across the aortic valve annulus. The Commander catheter was retracted using normal technique. Once final position of the valve has been confirmed by angiographic assessment, the valve is deployed during rapid ventricular pacing to maintain systolic blood pressure < 50 mmHg and pulse pressure < 10 mmHg. The balloon inflation is held for >3 seconds after reaching full deployment volume. Once the balloon has fully deflated the balloon is retracted into the ascending aorta and valve function is assessed using echocardiography. There is felt to be trivial paravalvular leak and no central aortic insufficiency.  The patient's hemodynamic recovery following valve deployment is good.  The deployment balloon and guidewire are both removed.    PROCEDURE COMPLETION:   The sheath was removed and femoral artery closure performed.  Protamine was administered once femoral arterial repair was complete. The temporary pacemaker, pigtail catheter and femoral sheaths were removed with manual pressure used for venous hemostasis.  A Mynx femoral closure device was utilized following removal of the diagnostic sheath in the left femoral artery.  The patient tolerated the procedure well and is transported to the cath lab recovery area in stable condition. There were no immediate intraoperative complications. All sponge instrument and needle counts are verified correct at completion of the operation.   No blood products were administered during the operation.  The patient received a total of 60 mL of intravenous contrast during the procedure.   Eugenio Hoes, MD 02/25/2023 12:45 PM

## 2023-02-25 NOTE — Discharge Summary (Addendum)
[facility]   MULTIDISCIPLINARY HEART VALVE TEAM  Discharge Summary    Patient ID: DAE ANTONUCCI MRN: [id number]; DOB: 1941/04/03  Admit date: 02/25/2023 Discharge date: 02/26/2023  Primary Care Provider: [name], MD  Primary Cardiologist: [name], DO / Dr. Lynnette Caffey, MD and Dr. Leafy Ro, MD (TAVR)  Discharge Diagnoses    Principal Problem:   S/P TAVR (transcatheter aortic valve replacement) Active Problems:   Dyslipidemia   Essential hypertension   Type 2 diabetes mellitus with other specified complication (HCC)   Severe aortic stenosis   1st degree AV block   Hypomagnesemia   NSVT (nonsustained ventricular tachycardia) (HCC)  Allergies Allergies  Allergen Reactions   Latex Other (See Comments)    Made the hands sore when worn   Nsaids Other (See Comments)    Internal bleeding (Meloxicam is suspected as the reason for today's ER visit)   Diagnostic Studies/Procedures   TAVR OPERATIVE NOTE     Date of Procedure:                02/25/2023   Preoperative Diagnosis:      Severe Aortic Stenosis    Postoperative Diagnosis:    Same    Procedure:        Transcatheter Aortic Valve Replacement - Percutaneous right Transfemoral Approach             Edwards Sapien 3 Ultra THV (size 23 mm, model # 9755RSL)              Co-Surgeons:                        Eugenio Hoes MD and Alverda Skeans, MD     Anesthesiologist:                  Sherril Cong MD   Echocardiographer:              Thurmon Fair MD   Pre-operative Echo Findings: Severe aortic stenosis normal left ventricular systolic function   Post-operative Echo Findings: trivial paravalvular leak normal left ventricular systolic function  _____________    Echo 02/26/23:  IMPRESSIONS  1. There is a trivial perivalvular leak at the 12 o'clock position best  seen on PLAX. The aortic valve has been repaired/replaced. Aortic valve  regurgitation is trivial. There is a 23 mm  Edwards Ultra, stented (TAVR)  valve present in the aortic  position. Procedure Date: 02/25/23. Echo findings are consistent with  normal structure and function of the aortic valve prosthesis. Aortic valve  area, by VTI measures 1.75 cm. Aortic valve mean gradient measures 10.0  mmHg. Aortic valve Vmax measures  2.16 m/s.   2. Left ventricular ejection fraction, by estimation, is 60 to 65%. Left  ventricular ejection fraction by PLAX is 62 %. The left ventricle has  normal function. The left ventricle has no regional wall motion  abnormalities. There is mild concentric left  ventricular hypertrophy. Left ventricular diastolic parameters are  consistent with Grade I diastolic dysfunction (impaired relaxation).   3. Right ventricular systolic function is normal. The right ventricular  size is normal. Tricuspid regurgitation signal is inadequate for assessing  PA pressure.   4. The mitral valve is degenerative. Trivial mitral valve regurgitation.  Mild mitral stenosis. The mean mitral valve gradient is 5.0 mmHg with  average heart rate of 87 bpm. Moderate mitral annular calcification.   5. The inferior vena cava is normal in size with greater than  50%  respiratory variability, suggesting right atrial pressure of 3 mmHg.    History of Present Illness     Jacqueline Rich is a 82 y.o. female with a history of HTN, HLD, DMT2, 1st deg AV block, former tobacco abuse, prior TIA and critical aortic stenosis who presented to [facility] on 02/25/23 for planned TAVR.   Jacqueline Rich is followed by Dr. Odis Hollingshead for her cardiology care. She has been followed for her aortic valve disease for quite some time. Echocardiogram 01/20/23 showed normal LVEF at 60-65% with critical AS with a mean gradient at , AVA 0.73cm2. [facility] 02/07/23 showed only mild to moderate non-obstructive CAD with normal right heart pressures.  She was evaluated by the multidisciplinary valve team and felt to have critical,  symptomatic aortic stenosis and to be a suitable candidate for TAVR, which was set up for 02/25/23.    Hospital Course   Severe/ Critical AS: s/p successful TAVR with a 23 mm Edwards Sapien 3 THV via the TF approach on 02/25/23. Post operative echo showed EF 60%, normally functioning TAVR with a mean gradient of 10 mmHg and trivial PVL (12 o'clock) as well as moderate MAC with mild MS.  Groin sites are stable. Started on a baby aspirin 81mg  daily. Walked with cardiac rehab with no issues. Plan for discharge home today with close follow up in the office.      First degree AV block: pt had a pre-existing 1st deg AV block. Initial post op ECG showed slight widening of QRS and prolongation of PR. ECG today with NSR with improved QRS complex and regression of PR. No ambulatory monitor needed.  Hypomagnesemia with brief NSVT: Replaced with PO Mg+ and KCL prior to discharge and resumed on home KCL. Asymptomatic.   HTN: Elevated above her baseline however will resume home regimen with losartan 50mg  daily, Toprol XL 25mg  daily and Dilt-XR 240mg  daily. Follow outpatient for any changes needed.   HLD: Stable with last LDL 71 from 04/2022. Resumed on Lipitor 10mg  daily.   DM2: Covered with SSI while inpatient. Resume home meds.   Consultants: None    The patient has been seen and examined by Dr. Lynnette Caffey who feels she is stable and ready for discharge today, 10/30. _____________  Discharge Vitals Blood pressure 136/72, pulse 98, temperature 99 F (37.2 C), temperature source Oral, resp. rate 18, height 5\' 4"  (1.626 m), weight 72.7 kg, SpO2 96%.  Filed Weights   02/25/23 0653 02/26/23 0403  Weight: 73.5 kg 72.7 kg   General: Well developed, well nourished, NAD Lungs:Clear to ausculation bilaterally. No wheezes, rales, or rhonchi. Breathing is unlabored. Cardiovascular: RRR with S1 S2. No murmurs Extremities: No edema. Groin site stable with no evidence of bleeding or hematoma.  Neuro: Alert and  oriented. No focal deficits. No facial asymmetry. MAE spontaneously. Psych: Responds to questions appropriately with normal affect.    Disposition   Pt is being discharged home today in good condition.  Follow-up Plans & Appointments    Follow-up Information     Janetta Hora, PA-C Follow up on 03/05/2023.   Specialties: Cardiology, Radiology Why: Your appointment will be at 3:35pm. Please arrive by 3:20pm Contact information: [address] [place] [address] [phone]                Discharge Instructions     Amb Referral to Cardiac Rehabilitation   Complete by: As directed    Diagnosis: Valve Replacement   Valve: Aortic  After initial evaluation and assessments completed: Virtual Based Care may be provided alone or in conjunction with Phase 2 Cardiac Rehab based on patient barriers.: Yes   Intensive Cardiac Rehabilitation (ICR) [facility] location only OR Traditional Cardiac Rehabilitation (TCR) *If criteria for ICR are not met will enroll in TCR [facility] only): Yes       Discharge Medications   Allergies as of 02/26/2023       Reactions   Latex Other (See Comments)   Made the hands sore when worn   Nsaids Other (See Comments)   Internal bleeding (Meloxicam is suspected as the reason for today's ER visit)        Medication List     TAKE these medications    amoxicillin 500 MG tablet Commonly known as: AMOXIL Take 2,000 mg by mouth See admin instructions. Take 4 tablets (2000 mg) by mouth 1 hour prior to dental appointments.   aspirin 81 MG chewable tablet Chew 1 tablet (81 mg total) by mouth daily. Start taking on: February 27, 2023   atorvastatin 10 MG tablet Commonly known as: LIPITOR Take 10 mg by mouth in the morning.   cyanocobalamin 1000 MCG tablet Commonly known as: VITAMIN B12 Take 1,000 mcg by mouth in the morning.   Dilt-XR 240 MG 24 hr capsule Generic drug: diltiazem Take 240 mg by mouth in the morning.    glimepiride 1 MG tablet Commonly known as: AMARYL Take 1 mg by mouth in the morning. Notes to patient: Resume on Friday 11/1.    lansoprazole 30 MG capsule Commonly known as: PREVACID Take 30 mg by mouth daily before breakfast.   losartan 50 MG tablet Commonly known as: COZAAR Take 50 mg by mouth daily.   metoprolol succinate 25 MG 24 hr tablet Commonly known as: TOPROL-XL TAKE 1 TABLET(25 MG) BY MOUTH DAILY   potassium chloride SA 20 MEQ tablet Commonly known as: KLOR-CON M Take 20 mEq by mouth in the morning.   sodium chloride 0.65 % Soln nasal spray Commonly known as: OCEAN Place 1 spray into both nostrils as needed for congestion.   Theratears PF 0.25 % Soln Generic drug: Carboxymethylcellulose Sod PF Place 1-2 drops into both eyes 3 (three) times daily as needed (dry/irritated eyes.).         Outstanding Labs/Studies    Duration of Discharge Encounter   Greater than 30 minutes including physician time.  Byrd Hesselbach, PA-C 02/26/2023, 1:06 PM [phone]   ATTENDING ATTESTATION:  After conducting a review of all available clinical information with the care team, interviewing the patient, and performing a physical exam, I agree with the findings and plan described in this note.   GEN: No acute distress.   HEENT:  MMM, no JVD, no scleral icterus Cardiac: RRR, with 2/6 SEM Respiratory: Clear to auscultation bilaterally. GI: Soft, nontender, non-distended  MS: No edema; No deformity. Neuro:  Nonfocal  Vasc:  +2 radial pulses; access sites intact  Patient doing well after TAVR with stable access sites, no evidence of stroke, and no conduction abnormalities.  She had 1 episode of NSVT; we will replete her potassium and magnesium and restart her beta-blocker.  Follow up echocardiogram with planned discharge and close hospital follow up with structural team.  Alverda Skeans, MD Pager [phone]

## 2023-02-25 NOTE — Progress Notes (Signed)
Pt arrived from ...cath.., A/ox 4...pt denies any pain, MD aware,CCMD called. CHG bath given,no further needs at this time   

## 2023-02-25 NOTE — Interval H&P Note (Signed)
History and Physical Interval Note:  02/25/2023 9:37 AM  Jacqueline Rich  has presented today for surgery, with the diagnosis of Critical Aortic Stenosis.  The various methods of treatment have been discussed with the patient and family. After consideration of risks, benefits and other options for treatment, the patient has consented to  Procedure(s): Transcatheter Aortic Valve Replacement, Transfemoral (N/A) INTRAOPERATIVE TRANSTHORACIC ECHOCARDIOGRAM (N/A) as a surgical intervention.  The patient's history has been reviewed, patient examined, no change in status, stable for surgery.  I have reviewed the patient's chart and labs.  Questions were answered to the patient's satisfaction.     Eugenio Hoes

## 2023-02-25 NOTE — Transfer of Care (Signed)
Immediate Anesthesia Transfer of Care Note  Patient: Jacqueline Rich  Procedure(s) Performed: Transcatheter Aortic Valve Replacement, Transfemoral INTRAOPERATIVE TRANSTHORACIC ECHOCARDIOGRAM  Patient Location: PACU and Cath Lab  Anesthesia Type:MAC  Level of Consciousness: drowsy  Airway & Oxygen Therapy: Patient Spontanous Breathing and Patient connected to nasal cannula oxygen  Post-op Assessment: Report given to RN and Post -op Vital signs reviewed and stable  Post vital signs: Reviewed and stable  Last Vitals:  Vitals Value Taken Time  BP    Temp    Pulse 60 02/25/23 1235  Resp 14 02/25/23 1235  SpO2 97 % 02/25/23 1235  Vitals shown include unfiled device data.  Last Pain:  Vitals:   02/25/23 1205  TempSrc:   PainSc: 4          Complications: There were no known notable events for this encounter.

## 2023-02-25 NOTE — Op Note (Signed)
HEART AND VASCULAR CENTER  TAVR OPERATIVE NOTE  Date of Procedure:                02/25/2023   Preoperative Diagnosis:      Severe Aortic Stenosis    Postoperative Diagnosis:    Same    Procedure:        Transcatheter Aortic Valve Replacement - Percutaneous right Transfemoral Approach             Edwards Sapien 3 Ultra THV (size 23 mm, model # [id number])              Co-Surgeons:                        Eugenio Hoes MD and Alverda Skeans, MD     Anesthesiologist:                  Sherril Cong MD   Echocardiographer:              Thurmon Fair MD   Pre-operative Echo Findings: Severe aortic stenosis Normal left ventricular systolic function   Post-operative Echo Findings: Trivial paravalvular leak Normal left ventricular systolic function   Left Heart Catheterization Findings: Left ventricular end-diastolic pressure of   BRIEF CLINICAL NOTE AND INDICATIONS FOR SURGERY  The patient is an 82 year old female with a history of hypertension, hyperlipidemia, type 2 diabetes, prior TIA, and critical aortic stenosis who is referred for elective transcatheter aortic valve replacement on the right transfemoral approach with a 23 mm SAPIEN 3 valve.   During the course of the patient's preoperative work up they have been evaluated comprehensively by a multidisciplinary team of specialists coordinated through the Multidisciplinary Heart Valve Clinic in the [facility].  They have been demonstrated to suffer from symptomatic severe aortic stenosis as noted above. The patient has been counseled extensively as to the relative risks and benefits of all options for the treatment of severe aortic stenosis including long term medical therapy, conventional surgery for aortic valve replacement, and transcatheter aortic valve replacement.  The patient has been independently evaluated by Dr. Milinda Antis with CT surgery and they are felt to be at high risk for conventional  surgical aortic valve replacement. The surgeon indicated the patient would be a poor candidate for conventional surgery. Based upon review of all of the patient's preoperative diagnostic tests they are felt to be candidate for transcatheter aortic valve replacement using the transfemoral approach as an alternative to high risk conventional surgery.    Following the decision to proceed with transcatheter aortic valve replacement, a discussion has been held regarding what types of management strategies would be attempted intraoperatively in the event of life-threatening complications, including whether or not the patient would be considered a candidate for the use of cardiopulmonary bypass and/or conversion to open sternotomy for attempted surgical intervention.  The patient has been advised of a variety of complications that might develop peculiar to this approach including but not limited to risks of death, stroke, paravalvular leak, aortic dissection or other major vascular complications, aortic annulus rupture, device embolization, cardiac rupture or perforation, acute myocardial infarction, arrhythmia, heart block or bradycardia requiring permanent pacemaker placement, congestive heart failure, respiratory failure, renal failure, pneumonia, infection, other late complications related to structural valve deterioration or migration, or other complications that might ultimately cause a temporary or permanent loss of functional independence or other long term morbidity.  The patient provides full informed consent  for the procedure as described and all questions were answered preoperatively.    DETAILS OF THE OPERATIVE PROCEDURE  PREPARATION:   The patient is brought to the operating room on the above mentioned date and central monitoring was established by the anesthesia team including placement of a radial arterial line. The patient is placed in the supine position on the operating table.  Intravenous  antibiotics are administered. Conscious sedation is used.   Baseline transthoracic echocardiogram was performed. The patient's chest, abdomen, both groins, and both lower extremities are prepared and draped in a sterile manner. A time out procedure is performed.   PERIPHERAL ACCESS:   Using the modified Seldinger technique, femoral arterial and venous access were obtained with placement of a 6 Fr sheath in the left femoral artery and a 6 Fr sheath in the right femoral vein using u/s guidance.  A pigtail diagnostic catheter was passed through the femoral arterial sheath under fluoroscopic guidance into the aortic root.  Aortic root angiography was performed in order to determine the optimal angiographic angle for valve deployment.  Ultrasound was used to gain access to the right internal jugular vein and a 6 French sheath was placed.  A temporary pacemaker was then maneuvered to the right ventricular apex, thresholds were tested, and found to be adequate.  TRANSFEMORAL ACCESS:  A micropuncture kit was used to gain access to the right femoral artery using u/s guidance. Position confirmed with angiography. Pre-closure with double ProGlide closure devices. The patient was heparinized systemically and ACT verified > 250 seconds.    A 14 Fr transfemoral E-sheath was introduced into the right femoral artery after progressively dilating over an Amplatz superstiff wire. An JR4 catheter was used to direct a straight-tip exchange length wire across the native aortic valve into the left ventricle. This was exchanged out for a pigtail catheter and position was confirmed in the LV apex. Simultaneous left ventricular, aortic, and left ventricular end-diastolic pressures were recorded.  The pigtail catheter was then exchanged for an Safari wire in the LV apex.    TRANSCATHETER HEART VALVE DEPLOYMENT:  An Edwards Sapien 3 THV (size 23 mm) was prepared and crimped per manufacturer's guidelines, and the proper  orientation of the valve is confirmed on the [employer] delivery system. The valve was advanced through the introducer sheath using normal technique until in an appropriate position in the abdominal aorta beyond the sheath tip. The balloon was then retracted and using the fine-tuning wheel was centered on the valve. The valve was then advanced across the aortic arch using appropriate flexion of the catheter. The valve was carefully positioned across the aortic valve annulus. The Commander catheter was retracted using normal technique. Once final position of the valve has been confirmed by angiographic assessment, the valve is deployed while temporarily holding ventilation and during rapid ventricular pacing to maintain systolic blood pressure < 50 mmHg and pulse pressure < 10 mmHg. The balloon inflation is held for >3 seconds after reaching full deployment volume. Once the balloon has fully deflated the balloon is retracted into the ascending aorta and valve function is assessed using TTE. There is felt to be no paravalvular leak and no central aortic insufficiency.  The patient's hemodynamic recovery following valve deployment is good.  The deployment balloon and guidewire are both removed. Echo demostrated acceptable post-procedural gradients, stable mitral valve function, and no AI.   PROCEDURE COMPLETION:  The sheath was then removed and closure devices were completed. Protamine was administered once femoral arterial  repair was complete. The temporary pacemaker, pigtail catheters and femoral sheaths were removed with a Mynx closure device placed in the artery and manual pressure used for venous hemostasis.    The patient tolerated the procedure well and is transported to the surgical intensive care in stable condition. There were no immediate intraoperative complications. All sponge instrument and needle counts are verified correct at completion of the operation.   No blood products were  administered during the operation.  The patient received a total of 50 mL of intravenous contrast during the procedure.  Orbie Pyo MD 02/25/2023 12:53 PM

## 2023-02-26 ENCOUNTER — Inpatient Hospital Stay (HOSPITAL_COMMUNITY): Payer: Medicare Other

## 2023-02-26 DIAGNOSIS — I4729 Other ventricular tachycardia: Secondary | ICD-10-CM | POA: Insufficient documentation

## 2023-02-26 DIAGNOSIS — Z952 Presence of prosthetic heart valve: Secondary | ICD-10-CM

## 2023-02-26 DIAGNOSIS — Z006 Encounter for examination for normal comparison and control in clinical research program: Secondary | ICD-10-CM | POA: Diagnosis not present

## 2023-02-26 DIAGNOSIS — I472 Ventricular tachycardia, unspecified: Secondary | ICD-10-CM | POA: Diagnosis not present

## 2023-02-26 DIAGNOSIS — I350 Nonrheumatic aortic (valve) stenosis: Secondary | ICD-10-CM | POA: Diagnosis not present

## 2023-02-26 DIAGNOSIS — Z1152 Encounter for screening for COVID-19: Secondary | ICD-10-CM | POA: Diagnosis not present

## 2023-02-26 LAB — GLUCOSE, CAPILLARY
Glucose-Capillary: 104 mg/dL — ABNORMAL HIGH (ref 70–99)
Glucose-Capillary: 120 mg/dL — ABNORMAL HIGH (ref 70–99)

## 2023-02-26 LAB — BASIC METABOLIC PANEL
Anion gap: 15 (ref 5–15)
BUN: 11 mg/dL (ref 8–23)
CO2: 23 mmol/L (ref 22–32)
Calcium: 10.4 mg/dL — ABNORMAL HIGH (ref 8.9–10.3)
Chloride: 98 mmol/L (ref 98–111)
Creatinine, Ser: 0.77 mg/dL (ref 0.44–1.00)
GFR, Estimated: >60 mL/min (ref >60)
Glucose, Bld: 108 mg/dL — ABNORMAL HIGH (ref 70–99)
Potassium: 3.6 mmol/L (ref 3.5–5.1)
Sodium: 136 mmol/L (ref 135–145)

## 2023-02-26 LAB — ECHOCARDIOGRAM COMPLETE
AR max vel: 1.74 cm2
AV Area VTI: 1.75 cm2
AV Area mean vel: 1.68 cm2
AV Mean grad: 10 mm[Hg]
AV Peak grad: 18.7 mm[Hg]
Ao pk vel: 2.16 m/s
Area-P 1/2: 2.69 cm2
Height: 64 in
S' Lateral: 2.5 cm
Weight: 2563.2 [oz_av]

## 2023-02-26 LAB — CBC
HCT: 28.6 % — ABNORMAL LOW (ref 36.0–46.0)
Hemoglobin: 9.3 g/dL — ABNORMAL LOW (ref 12.0–15.0)
MCH: 27.6 pg (ref 26.0–34.0)
MCHC: 32.5 g/dL (ref 30.0–36.0)
MCV: 84.9 fL (ref 80.0–100.0)
Platelets: 244 10*3/uL (ref 150–400)
RBC: 3.37 MIL/uL — ABNORMAL LOW (ref 3.87–5.11)
RDW: 14.5 % (ref 11.5–15.5)
WBC: 11.1 10*3/uL — ABNORMAL HIGH (ref 4.0–10.5)
nRBC: 0 % (ref 0.0–0.2)

## 2023-02-26 LAB — MAGNESIUM: Magnesium: 1.6 mg/dL — ABNORMAL LOW (ref 1.7–2.4)

## 2023-02-26 MED ORDER — ASPIRIN 81 MG PO CHEW: 81.0000 mg | CHEWABLE_TABLET | Freq: Every day | ORAL | Status: AC

## 2023-02-26 MED ORDER — MAGNESIUM OXIDE -MG SUPPLEMENT 400 (240 MG) MG PO TABS
400.0000 mg | ORAL_TABLET | Freq: Once | ORAL | Status: AC
Start: 2023-02-26 — End: 2023-02-26
  Administered 2023-02-26: 400 mg via ORAL
  Filled 2023-02-26: qty 1

## 2023-02-26 MED ORDER — POTASSIUM CHLORIDE CRYS ER 20 MEQ PO TBCR
40.0000 meq | EXTENDED_RELEASE_TABLET | Freq: Once | ORAL | Status: AC
  Administered 2023-02-26: 40 meq via ORAL
  Filled 2023-02-26: qty 2

## 2023-02-26 NOTE — Progress Notes (Signed)
   02/26/23 1409  TOC Brief Assessment  Insurance and Status Reviewed  Patient has primary care physician Yes  Home environment has been reviewed home with sister  Prior level of function: independent  Prior/Current Home Services No current home services  Social Determinants of Health Reivew SDOH reviewed no interventions necessary  Readmission risk has been reviewed Yes  Transition of care needs no transition of care needs at this time     Pt s/p TAVR, stable for transition home today, per pt she has cane and walker at home- no new DME needs, lives with sister who will provide transportation home.  No TOC needs noted.

## 2023-02-26 NOTE — Progress Notes (Signed)
CARDIAC REHAB PHASE I   PRE:  Rate/Rhythm: 84 NSR  BP:  Sitting: 149/63      SpO2: 96 RA  MODE:  Ambulation: 190 ft    POST:  Rate/Rhythm: 110 ST  BP:  Sitting: 145/61     SpO2: 96 RA  Pt ambulated with RW requiring supervision assistance in hallway. Pt walked well, reports some difference in SOB while walking. Pt assisted to chair and educated on CRP2 at [facility], ex guidelines, HH diet, and restrictions. Pt denies questions, will refer to [facility].   Faustino Congress  MS, ACSM-CEP 9:37 AM 02/26/2023    Service time is from 0901 to [phone].

## 2023-02-26 NOTE — Progress Notes (Signed)
Echocardiogram 2D Echocardiogram has been performed.  Warren Lacy Marlos Carmen RDCS 02/26/2023, 9:03 AM

## 2023-02-26 NOTE — Discharge Instructions (Signed)

## 2023-02-26 NOTE — Progress Notes (Signed)
Discharge instructions (including medications) discussed with and copy provided to patient/caregiver 

## 2023-02-26 NOTE — Progress Notes (Signed)
Mobility Specialist Progress Note:   02/26/23 1327  Mobility  Activity Ambulated with assistance in hallway  Level of Assistance  (MinG)  Assistive Device Front wheel walker  Distance Ambulated (ft) 190 ft  Activity Response Tolerated well  Mobility Referral Yes  $Mobility charge 1 Mobility  Mobility Specialist Start Time (ACUTE ONLY) 1230  Mobility Specialist Stop Time (ACUTE ONLY) 1235  Mobility Specialist Time Calculation (min) (ACUTE ONLY) 5 min   Pre Mobility: 92 HR  During Mobility: 105 HR Post Mobility: 90 HR   Pt received in bed, agreeable to mobility. SB to stand. MinG during ambulation. Pt denied any discomfort during ambulation. Asx throughout. Pt returned to bed with call bell in reach and all needs met.  Leory Plowman  Mobility Specialist Please contact via [employer] office at [phone]

## 2023-02-26 NOTE — Plan of Care (Signed)

## 2023-02-27 ENCOUNTER — Telehealth: Payer: Self-pay | Admitting: Physician Assistant

## 2023-02-27 NOTE — Telephone Encounter (Signed)
  HEART AND VASCULAR CENTER   MULTIDISCIPLINARY HEART VALVE TEAM   Patient contacted regarding discharge from [facility] on 10/30  Patient understands to follow up with a structural heart APP on 11/6 at [address].  Patient understands discharge instructions? yes Patient understands medications and regimen? yes Patient understands to bring all medications to this visit? yes  Cline Crock PA-C  [facility]

## 2023-02-28 ENCOUNTER — Telehealth (HOSPITAL_COMMUNITY): Payer: Self-pay

## 2023-02-28 NOTE — Telephone Encounter (Signed)
Referral recv'd and verified for MD signature. Follow up appointment is on 11/6@335 . Insurance benefits and eligibility TBD.

## 2023-02-28 NOTE — Telephone Encounter (Signed)
Called patient to see if she is interested in the Cardiac Rehab Program. Patient expressed interest. Explained scheduling process and went over insurance, patient verbalized understanding. Someone from our cardiac rehab staff will contact pt at a later time.

## 2023-03-04 ENCOUNTER — Telehealth (HOSPITAL_COMMUNITY): Payer: Self-pay

## 2023-03-04 NOTE — Telephone Encounter (Signed)
Pt insurance is active and benefits verified through [facility] Co-pay $0, DED $0/$0 met, out of pocket $3500/$1779.21 met, co-insurance 0%. no pre-authorization required. Passport, 03/04/2023@336 , REF# [id number]  How many CR sessions are covered? (36 visits for TCR, 72 visits for ICR)72 Is this a lifetime maximum or an annual maximum? annual Has the member used any of these services to date? no Is there a time limit (weeks/months) on start of program and/or program completion? no

## 2023-03-05 ENCOUNTER — Ambulatory Visit: Payer: Medicare Other

## 2023-03-05 ENCOUNTER — Ambulatory Visit: Payer: Medicare Other | Attending: Cardiology | Admitting: Physician Assistant

## 2023-03-05 VITALS — BP 132/80 | HR 68 | Ht 64.0 in | Wt 159.2 lb

## 2023-03-05 DIAGNOSIS — I4729 Other ventricular tachycardia: Secondary | ICD-10-CM | POA: Diagnosis not present

## 2023-03-05 DIAGNOSIS — Z952 Presence of prosthetic heart valve: Secondary | ICD-10-CM | POA: Diagnosis not present

## 2023-03-05 DIAGNOSIS — I10 Essential (primary) hypertension: Secondary | ICD-10-CM

## 2023-03-05 DIAGNOSIS — E785 Hyperlipidemia, unspecified: Secondary | ICD-10-CM

## 2023-03-05 DIAGNOSIS — I440 Atrioventricular block, first degree: Secondary | ICD-10-CM | POA: Diagnosis not present

## 2023-03-05 MED ORDER — AMOXICILLIN 500 MG PO TABS: 2000.0000 mg | ORAL_TABLET | ORAL | 12 refills | Status: AC

## 2023-03-05 NOTE — Patient Instructions (Signed)
Medication Instructions:  Your physician recommends that you continue on your current medications as directed. Please refer to the Current Medication list given to you today.  *If you need a refill on your cardiac medications before your next appointment, please call your pharmacy*  Lab Work: TODAY": BMET and Mg If you have labs (blood work) drawn today and your tests are completely normal, you will receive your results only by: MyChart Message (if you have MyChart) OR A paper copy in the mail If you have any lab test that is abnormal or we need to change your treatment, we will call you to review the results.  Follow-Up: At [facility], you and your health needs are our priority.  As part of our continuing mission to provide you with exceptional heart care, we have created designated Provider Care Teams.  These Care Teams include your primary Cardiologist (physician) and Advanced Practice Providers (APPs -  Physician Assistants and Nurse Practitioners) who all work together to provide you with the care you need, when you need it.  Your next appointment:   As scheduled

## 2023-03-05 NOTE — Progress Notes (Signed)
HEART AND VASCULAR CENTER   MULTIDISCIPLINARY HEART VALVE CLINIC                                     Cardiology Office Note:    Date:  03/05/2023   ID:  Jacqueline Rich, DOB 11-17-1940, MRN [id number]  PCP:  [name], MD  [facility] Cardiologist:  [name], DO  / Dr. Lynnette Caffey, MD and Dr. Leafy Ro, MD (TAVR)  [facility] Electrophysiologist:  None   Referring MD: [name],*   TOC s/p TAVR  History of Present Illness:    Jacqueline Rich is a 82 y.o. 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  Jacqueline Rich is followed by Dr. Odis Hollingshead for her cardiology care. She has been followed for her aortic valve disease for quite some time. Echocardiogram 01/20/23 showed normal LVEF at 60-65% with critical AS with a mean gradient at , AVA 0.73cm2. [facility] 02/07/23 showed only mild to moderate non-obstructive CAD with normal right heart pressures.   She was evaluated by the multidisciplinary valve team and underwent successful TAVR with a 23 mm Edwards Sapien 3 THV via the TF approach on 02/25/23. Post operative echo showed EF 60%, normally functioning TAVR with a mean gradient of 10 mmHg and trivial PVL (12 o'clock) as well as moderate MAC with mild MS. She was started on baby aspirin 81mg  daily. She had some asymptomatic NSVT and electrolytes were supplemented.   Today the patient presents to clinic for follow up. Here alone. Has had some mild wooziness with positional changes or laying down with head position changes. No CP or SOB. No LE edema, orthopnea or PND. No dizziness or syncope. No blood in stool or urine. No palpitations.     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 Lynnette Caffey & Leafy Ro.   Severe aortic stenosis    Stroke (HCC)    mini stroke- no deficits approx 2 years ago   TIA (transient ischemic attack)     Past Surgical History:  Procedure Laterality Date   ABDOMINAL HYSTERECTOMY     CATARACT EXTRACTION Bilateral 11/27/2020   COLON SURGERY     COLONOSCOPY WITH PROPOFOL N/A 05/17/2022   Procedure: COLONOSCOPY WITH PROPOFOL;  Surgeon: Kathi Der, MD;  Location: [facility] ENDOSCOPY;  Service: Gastroenterology;  Laterality: N/A;   INTRAOPERATIVE TRANSTHORACIC ECHOCARDIOGRAM N/A 02/25/2023   Procedure: INTRAOPERATIVE TRANSTHORACIC ECHOCARDIOGRAM;  Surgeon: Orbie Pyo, MD;  Location: [facility] INVASIVE CV LAB;  Service: Cardiovascular;  Laterality: N/A;   RIGHT HEART CATH AND CORONARY ANGIOGRAPHY N/A 02/07/2023   Procedure: RIGHT HEART CATH AND CORONARY ANGIOGRAPHY;  Surgeon: Kathleene Hazel, MD;  Location: [facility] INVASIVE CV LAB;  Service: Cardiovascular;  Laterality: N/A;   right knee replacement      TOTAL KNEE ARTHROPLASTY Left 09/16/2022   Procedure: TOTAL KNEE ARTHROPLASTY;  Surgeon: Joen Laura, MD;  Location: [facility] ORS;  Service: Orthopedics;  Laterality: Left;  TOTAL KNEE ARTHROPLASTY Right     Current Medications: Current Meds  Medication Sig   amoxicillin (AMOXIL) 500 MG tablet Take 4 tablets (2,000 mg total) by mouth as directed. 1 hour prior to dental work including cleanings   aspirin 81 MG chewable tablet Chew 1 tablet (81 mg total) by mouth daily.   atorvastatin (LIPITOR) 10 MG tablet Take 10 mg by mouth in the morning.   Carboxymethylcellulose Sod PF (THERATEARS PF) 0.25 % SOLN Place 1-2 drops into both eyes 3 (three) times daily as needed (dry/irritated eyes.).   cyanocobalamin (VITAMIN B12) 1000 MCG tablet Take 1,000 mcg by mouth in the morning.   DILT-XR 240 MG  24 hr capsule Take 240 mg by mouth in the morning.   glimepiride (AMARYL) 1 MG tablet Take 1 mg by mouth in the morning.   lansoprazole (PREVACID) 30 MG capsule Take 30 mg by mouth daily before breakfast.   losartan (COZAAR) 50 MG tablet Take 50 mg by mouth daily.   metoprolol succinate (TOPROL-XL) 25 MG 24 hr tablet TAKE 1 TABLET(25 MG) BY MOUTH DAILY   potassium chloride SA (KLOR-CON) 20 MEQ tablet Take 20 mEq by mouth in the morning.   sodium chloride (OCEAN) 0.65 % SOLN nasal spray Place 1 spray into both nostrils as needed for congestion.   [DISCONTINUED] amoxicillin (AMOXIL) 500 MG tablet Take 2,000 mg by mouth See admin instructions. Take 4 tablets (2000 mg) by mouth 1 hour prior to dental appointments.      ROS:   Please see the history of present illness.    All other systems reviewed and are negative.  EKGs   EKG:  EKG is ordered today.  The ekg ordered today demonstrates sinus with septal Q waves. HR 68  Recent Labs: 02/21/2023: ALT 12 02/26/2023: BUN 11; Creatinine, Ser 0.77; Hemoglobin 9.3; Magnesium 1.6; Platelets 244; Potassium 3.6; Sodium 136  Recent Lipid Panel    Component Value Date/Time   CHOL 132 05/17/2022 0435   TRIG 129 05/17/2022 0435   HDL 35 (L) 05/17/2022 0435   CHOLHDL 3.8 05/17/2022 0435   VLDL 26 05/17/2022 0435   LDLCALC 71 05/17/2022 0435     Risk Assessment/Calculations:           Physical Exam:    VS:  BP 132/80   Pulse 68   Ht 5\' 4"  (1.626 m)   Wt 159 lb 3.2 oz (72.2 kg)   SpO2 97%   BMI 27.33 kg/m     Wt Readings from Last 3 Encounters:  03/05/23 159 lb 3.2 oz (72.2 kg)  02/26/23 160 lb 3.2 oz (72.7 kg)  02/21/23 163 lb 14.4 oz (74.3 kg)     GEN: Well nourished, well developed in no acute distress NECK: No JVD; No carotid bruits CARDIAC: RRR, soft flow murmur @ RUSB. No rubs, gallops RESPIRATORY:  Clear to auscultation without rales, wheezing or rhonchi  ABDOMEN: Soft, non-tender, non-distended EXTREMITIES:  No edema;  No deformity.  Groin sites clear without hematoma or ecchymosis    ASSESSMENT:    1. S/P TAVR (transcatheter aortic valve replacement)   2. 1st degree AV block   3. Benign hypertension   4. NSVT (nonsustained ventricular tachycardia) (HCC)   5. Dyslipidemia    PLAN:    In order of problems listed above:  Critical AS s/p TAVR: doing great 1 week out from TAVR. ECG with no HAVB. Groin sites are stable. SBE prophylaxis discussed; I have RX'd amoxicillin. Continue on a baby  aspirin 81mg  daily. I will see her back for 1 month follow up and echo.  First degree AV block: pt had a pre-existing 1st deg AV block. Initial post op ECG showed slight widening of QRS and prolongation of PR that later resolved. ECG today shows sinus with no 1st deg AV block.    NSVT: none on ECG today. Will recheck BMET and Mag today   HTN: BP well controlled. Continue losartan 50mg  daily, Toprol XL 25mg  daily and Dilt-XR 240mg  daily.   HLD: stable with last LDL 71 from 04/2022. Continue on Lipitor 10mg  daily.     Cardiac Rehabilitation Eligibility Assessment  The patient is ready to start cardiac rehabilitation from a cardiac standpoint.         Medication Adjustments/Labs and Tests Ordered: Current medicines are reviewed at length with the patient today.  Concerns regarding medicines are outlined above.  Orders Placed This Encounter  Procedures   Basic metabolic panel   Magnesium   EKG 12-Lead   Meds ordered this encounter  Medications   amoxicillin (AMOXIL) 500 MG tablet    Sig: Take 4 tablets (2,000 mg total) by mouth as directed. 1 hour prior to dental work including cleanings    Dispense:  12 tablet    Refill:  12    Order Specific Question:   Supervising Provider    Answer:   Tonny Bollman [[id number]]    Patient Instructions  Medication Instructions:  Your physician recommends that you continue on your current medications as directed. Please refer to the Current Medication list given to you  today.  *If you need a refill on your cardiac medications before your next appointment, please call your pharmacy*  Lab Work: TODAY": BMET and Mg If you have labs (blood work) drawn today and your tests are completely normal, you will receive your results only by: MyChart Message (if you have MyChart) OR A paper copy in the mail If you have any lab test that is abnormal or we need to change your treatment, we will call you to review the results.  Follow-Up: At [facility], you and your health needs are our priority.  As part of our continuing mission to provide you with exceptional heart care, we have created designated Provider Care Teams.  These Care Teams include your primary Cardiologist (physician) and Advanced Practice Providers (APPs -  Physician Assistants and Nurse Practitioners) who all work together to provide you with the care you need, when you need it.  Your next appointment:   As scheduled    Signed, Cline Crock, PA-C  03/05/2023 3:12 PM    [facility]

## 2023-03-06 DIAGNOSIS — M79643 Pain in unspecified hand: Secondary | ICD-10-CM | POA: Diagnosis not present

## 2023-03-06 DIAGNOSIS — E119 Type 2 diabetes mellitus without complications: Secondary | ICD-10-CM | POA: Diagnosis not present

## 2023-03-06 DIAGNOSIS — Z952 Presence of prosthetic heart valve: Secondary | ICD-10-CM | POA: Diagnosis not present

## 2023-03-06 DIAGNOSIS — D649 Anemia, unspecified: Secondary | ICD-10-CM | POA: Diagnosis not present

## 2023-03-06 LAB — BASIC METABOLIC PANEL
BUN/Creatinine Ratio: 8 — ABNORMAL LOW (ref 12–28)
BUN: 7 mg/dL — ABNORMAL LOW (ref 8–27)
CO2: 24 mmol/L (ref 20–29)
Calcium: 9.4 mg/dL (ref 8.7–10.3)
Chloride: 102 mmol/L (ref 96–106)
Creatinine, Ser: 0.83 mg/dL (ref 0.57–1.00)
Glucose: 85 mg/dL (ref 70–99)
Potassium: 4 mmol/L (ref 3.5–5.2)
Sodium: 139 mmol/L (ref 134–144)
eGFR: 71 mL/min/{1.73_m2} (ref >59)

## 2023-03-06 LAB — MAGNESIUM: Magnesium: 1.9 mg/dL (ref 1.6–2.3)

## 2023-03-07 ENCOUNTER — Encounter (HOSPITAL_COMMUNITY): Payer: Self-pay

## 2023-03-07 ENCOUNTER — Telehealth (HOSPITAL_COMMUNITY): Payer: Self-pay

## 2023-03-07 NOTE — Telephone Encounter (Signed)
Attempted to call patient in regards to Cardiac Rehab - LM on VM Mailed letter 

## 2023-03-13 ENCOUNTER — Other Ambulatory Visit: Payer: Self-pay | Admitting: Internal Medicine

## 2023-03-13 DIAGNOSIS — R921 Mammographic calcification found on diagnostic imaging of breast: Secondary | ICD-10-CM

## 2023-03-17 ENCOUNTER — Telehealth (HOSPITAL_COMMUNITY): Payer: Self-pay

## 2023-03-17 NOTE — Telephone Encounter (Signed)
Pt returned CR phone call and stated she is not interested at this time.   Closed referral 

## 2023-03-25 ENCOUNTER — Telehealth (INDEPENDENT_AMBULATORY_CARE_PROVIDER_SITE_OTHER): Payer: Self-pay | Admitting: Otolaryngology

## 2023-03-25 NOTE — Telephone Encounter (Signed)
Spoke with patient, confirmed appt and same location

## 2023-03-26 ENCOUNTER — Encounter (INDEPENDENT_AMBULATORY_CARE_PROVIDER_SITE_OTHER): Payer: Self-pay

## 2023-03-26 ENCOUNTER — Ambulatory Visit (INDEPENDENT_AMBULATORY_CARE_PROVIDER_SITE_OTHER): Payer: Medicare Other | Admitting: Otolaryngology

## 2023-03-26 VITALS — Ht 64.0 in | Wt 161.0 lb

## 2023-03-26 DIAGNOSIS — J358 Other chronic diseases of tonsils and adenoids: Secondary | ICD-10-CM

## 2023-03-26 DIAGNOSIS — D3701 Neoplasm of uncertain behavior of lip: Secondary | ICD-10-CM | POA: Insufficient documentation

## 2023-03-26 NOTE — Progress Notes (Signed)
Patient ID: Jacqueline Rich, female   DOB: 1941-01-07, 82 y.o.   MRN: [id number]  Follow-up: Right tonsillar cyst  HPI: The patient is an 82 year old female who returns today for her follow-up evaluation.  She was last seen 1 year ago.  At that time, she was noted to have a stable 1 cm right tonsillar cyst.  No other suspicious mass or lesion was noted.  The treatment options were discussed.  The patient elected to proceed with conservative observation.  According to the patient, she has not noted any change in her tonsillar cyst.  She denies any dysphagia, odynophagia, dysphonia, or dyspnea.  She has noted occasional bad taste in her throat.  She has no other complaint today.  Exam: General: Communicates without difficulty, well nourished, no acute distress. Head: Normocephalic, no evidence injury, no tenderness, facial buttresses intact without stepoff. Face/sinus: No tenderness to palpation and percussion. Facial movement is normal and symmetric. Eyes: PERRL, EOMI. No scleral icterus, conjunctivae clear. Neuro: CN II exam reveals vision grossly intact.  No nystagmus at any point of gaze. Ears: Auricles well formed without lesions.  Ear canals are intact without mass or lesion.  No erythema or edema is appreciated.  The TMs are intact without fluid. Nose: External evaluation reveals normal support and skin without lesions.  Dorsum is intact.  Anterior rhinoscopy reveals pink mucosa over anterior aspect of inferior turbinates and intact septum.  No purulence noted. Oral: 1 cm right tonsillar cyst.  The cyst has a smooth surface without any ulceration. Neck: Full range of motion without pain.  There is no significant lymphadenopathy.  No masses palpable.  Thyroid bed within normal limits to palpation.  Parotid glands and submandibular glands equal bilaterally without mass.  Trachea is midline. Neuro:  CN 2-12 grossly intact. Gait normal.   Assessment 1.  The patient has a stable 1 cm right tonsillar  cyst. The cyst has a smooth surface without any ulceration.  2.  The rest of her ENT exam is normal.  Plan 1.  The physical exam findings are reviewed with the patient. 2.  The treatment options are again discussed.  Options include conservative observation versus surgical excision. 3.  The patient would like to continue with conservative observation. 4.  The patient will return for reevaluation in 12 months.

## 2023-04-01 NOTE — Progress Notes (Unsigned)
[facility]   MULTIDISCIPLINARY HEART VALVE CLINIC                                     Cardiology Office Note:    Date:  04/02/2023   ID:  Jacqueline Rich, DOB Mar 24, 1941, MRN [id number]  PCP:  [name], MD  [facility] Cardiologist:  [name], DO  / Dr. Lynnette Caffey, MD and Dr. Leafy Ro, MD (TAVR)  [facility] Electrophysiologist:  None   Referring MD: [name],*   1 month s/p TAVR  History of Present Illness:    Jacqueline Rich is a 82 y.o. 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  Echocardiogram 01/20/23 showed normal LVEF at 60-65% with critical AS with a mean gradient at 63 mmHg and AVA 0.73cm2. [facility] 02/07/23 showed mild to moderate non-obstructive CAD with normal right heart pressures. She underwent TAVR with a 23 mm Edwards Sapien 3 THV via the TF approach on 02/25/23. Post operative echo showed EF 60%, normally functioning TAVR with a mean gradient of 10 mmHg and trivial PVL (12 o'clock) as well as moderate MAC with mild MS. She was started on baby aspirin 81mg  daily. She had some asymptomatic NSVT and electrolytes were supplemented.   Today the patient presents to clinic for follow up. Here alone. Biggest complaint is right arm and hand pain. This has been worse since her procedure. Has had stiffness all over her body that she thinks is arthritis. No CP or SOB. No LE edema, orthopnea or PND. No dizziness or syncope. No blood in stool or urine. No palpitations.    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 Lynnette Caffey & Leafy Ro.   Severe aortic stenosis    Stroke (HCC)    mini stroke- no deficits approx 2 years ago   TIA (transient ischemic attack)     Past Surgical History:  Procedure Laterality Date   ABDOMINAL HYSTERECTOMY     CATARACT EXTRACTION Bilateral 11/27/2020   COLON SURGERY     COLONOSCOPY WITH PROPOFOL N/A 05/17/2022   Procedure: COLONOSCOPY WITH PROPOFOL;  Surgeon: Kathi Der, MD;  Location: [facility];  Service: Gastroenterology;  Laterality: N/A;   INTRAOPERATIVE TRANSTHORACIC ECHOCARDIOGRAM N/A 02/25/2023   Procedure: INTRAOPERATIVE TRANSTHORACIC ECHOCARDIOGRAM;  Surgeon: Orbie Pyo, MD;  Location: [facility] INVASIVE CV LAB;  Service: Cardiovascular;  Laterality: N/A;   RIGHT HEART CATH AND CORONARY ANGIOGRAPHY N/A 02/07/2023   Procedure: RIGHT HEART CATH AND CORONARY ANGIOGRAPHY;  Surgeon: Kathleene Hazel, MD;  Location: [facility] INVASIVE CV LAB;  Service: Cardiovascular;  Laterality: N/A;   right knee replacement      TOTAL KNEE ARTHROPLASTY Left 09/16/2022   Procedure: TOTAL KNEE ARTHROPLASTY;  Surgeon: Joen Laura, MD;  Location: [facility] ORS;  Service: Orthopedics;  Laterality: Left;   TOTAL KNEE ARTHROPLASTY Right     Current Medications: Current Meds  Medication Sig   amoxicillin (AMOXIL) 500  MG tablet Take 4 tablets (2,000 mg total) by mouth as directed. 1 hour prior to dental work including cleanings   aspirin 81 MG chewable tablet Chew 1 tablet (81 mg total) by mouth daily.   atorvastatin (LIPITOR) 10 MG tablet Take 10 mg by mouth in the morning.   Carboxymethylcellulose Sod PF (THERATEARS PF) 0.25 % SOLN Place 1-2 drops into both eyes 3 (three) times daily as needed (dry/irritated eyes.).   cyanocobalamin (VITAMIN B12) 1000 MCG tablet Take 1,000 mcg by mouth in the morning.   DILT-XR 240 MG 24 hr capsule Take 240 mg by mouth in the morning.   glimepiride (AMARYL) 1 MG tablet Take 1 mg by mouth in the morning.   lansoprazole (PREVACID) 30 MG  capsule Take 30 mg by mouth daily before breakfast.   losartan (COZAAR) 50 MG tablet Take 50 mg by mouth daily.   metoprolol succinate (TOPROL-XL) 25 MG 24 hr tablet TAKE 1 TABLET(25 MG) BY MOUTH DAILY   potassium chloride SA (KLOR-CON) 20 MEQ tablet Take 20 mEq by mouth in the morning.   sodium chloride (OCEAN) 0.65 % SOLN nasal spray Place 1 spray into both nostrils as needed for congestion.      ROS:   Please see the history of present illness.    All other systems reviewed and are negative.  EKGs   EKG:  EKG is NOT ordered today.    Recent Labs: 02/21/2023: ALT 12 02/26/2023: Hemoglobin 9.3; Platelets 244 03/05/2023: BUN 7; Creatinine, Ser 0.83; Magnesium 1.9; Potassium 4.0; Sodium 139  Recent Lipid Panel    Component Value Date/Time   CHOL 132 05/17/2022 0435   TRIG 129 05/17/2022 0435   HDL 35 (L) 05/17/2022 0435   CHOLHDL 3.8 05/17/2022 0435   VLDL 26 05/17/2022 0435   LDLCALC 71 05/17/2022 0435     Risk Assessment/Calculations:           Physical Exam:    VS:  BP 132/68 (BP Location: Left Arm, Patient Position: Sitting, Cuff Size: Normal)   Pulse 72   Ht 5\' 4"  (1.626 m)   Wt 160 lb 6.4 oz (72.8 kg)   SpO2 95%   BMI 27.53 kg/m     Wt Readings from Last 3 Encounters:  04/02/23 160 lb 6.4 oz (72.8 kg)  03/26/23 161 lb (73 kg)  03/05/23 159 lb 3.2 oz (72.2 kg)     GEN: Well nourished, well developed in no acute distress NECK: No JVD; No carotid bruits CARDIAC: RRR, soft flow murmur @ RUSB. No rubs, gallops RESPIRATORY:  Clear to auscultation without rales, wheezing or rhonchi  ABDOMEN: Soft, non-tender, non-distended EXTREMITIES:  No edema; No deformity.   ASSESSMENT:    1. S/P TAVR (transcatheter aortic valve replacement)   2. 1st degree AV block   3. NSVT (nonsustained ventricular tachycardia) (HCC)   4. Benign hypertension   5. Dyslipidemia   6. Right arm pain     PLAN:    In order of problems listed above:  Critical AS s/p TAVR: echo  today shows EF 60%, mild LVH, normally functioning TAVR with a mean gradient of 12 mm hg and no PVL. She has NYHA class I symptoms. SBE prophylaxis discussed; she has amoxicillin. Continue on a baby aspirin 81mg  daily. I will see her back for 1 year follow up and echo.  First degree AV block: stable after TAVR.  NSVT: repeat labs 11/16 showed stable electrolytes. Continue Toprol XL 25mg  daily   HTN: BP well  controlled. Continue losartan 50mg  daily, Toprol XL 25mg  daily and Dilt-XR 240mg  daily.   HLD: stable with last LDL 71 from 04/2022. Continue on Lipitor 10mg  daily.  Right arm pain: worse since TAVR where IVs were place. There is no abnormality on physical exam and she has a good pulse. Possible nerve irritation from IVs. PCP told her it was arthritis.    Medication Adjustments/Labs and Tests Ordered: Current medicines are reviewed at length with the patient today.  Concerns regarding medicines are outlined above.  No orders of the defined types were placed in this encounter.  No orders of the defined types were placed in this encounter.   Patient Instructions  Medication Instructions:  Your physician recommends that you continue on your current medications as directed. Please refer to the Current Medication list given to you today.  *If you need a refill on your cardiac medications before your next appointment, please call your pharmacy*  Lab Work: None ordered today.  Testing/Procedures: None ordered today.  Follow-Up: At [facility], you and your health needs are our priority.  As part of our continuing mission to provide you with exceptional heart care, we have created designated Provider Care Teams.  These Care Teams include your primary Cardiologist (physician) and Advanced Practice Providers (APPs -  Physician Assistants and Nurse Practitioners) who all work together to provide you with the care you need, when you need it.  Your next appointment:   1 year(s)  The  format for your next appointment:   In Person  Provider:   Carlean Jews, PA-C{   Signed, Cline Crock, PA-C  04/02/2023 3:27 PM    [facility]

## 2023-04-02 ENCOUNTER — Ambulatory Visit (HOSPITAL_COMMUNITY): Payer: Medicare Other | Attending: Internal Medicine

## 2023-04-02 ENCOUNTER — Other Ambulatory Visit: Payer: Self-pay

## 2023-04-02 ENCOUNTER — Ambulatory Visit: Payer: Medicare Other

## 2023-04-02 VITALS — BP 132/68 | HR 72 | Ht 64.0 in | Wt 160.4 lb

## 2023-04-02 DIAGNOSIS — I4729 Other ventricular tachycardia: Secondary | ICD-10-CM | POA: Insufficient documentation

## 2023-04-02 DIAGNOSIS — Z952 Presence of prosthetic heart valve: Secondary | ICD-10-CM

## 2023-04-02 DIAGNOSIS — I10 Essential (primary) hypertension: Secondary | ICD-10-CM | POA: Insufficient documentation

## 2023-04-02 DIAGNOSIS — M79601 Pain in right arm: Secondary | ICD-10-CM

## 2023-04-02 DIAGNOSIS — I350 Nonrheumatic aortic (valve) stenosis: Secondary | ICD-10-CM | POA: Insufficient documentation

## 2023-04-02 DIAGNOSIS — I440 Atrioventricular block, first degree: Secondary | ICD-10-CM | POA: Insufficient documentation

## 2023-04-02 DIAGNOSIS — E785 Hyperlipidemia, unspecified: Secondary | ICD-10-CM | POA: Diagnosis not present

## 2023-04-02 LAB — ECHOCARDIOGRAM COMPLETE
AV Mean grad: 12 mm[Hg]
AV Peak grad: 21.4 mm[Hg]
Ao pk vel: 2.31 m/s
Area-P 1/2: 2.99 cm2
S' Lateral: 2.3 cm

## 2023-04-02 NOTE — Patient Instructions (Signed)
Medication Instructions:  Your physician recommends that you continue on your current medications as directed. Please refer to the Current Medication list given to you today.  *If you need a refill on your cardiac medications before your next appointment, please call your pharmacy*  Lab Work: None ordered today.  Testing/Procedures: None ordered today.  Follow-Up: At [facility], you and your health needs are our priority.  As part of our continuing mission to provide you with exceptional heart care, we have created designated Provider Care Teams.  These Care Teams include your primary Cardiologist (physician) and Advanced Practice Providers (APPs -  Physician Assistants and Nurse Practitioners) who all work together to provide you with the care you need, when you need it.  Your next appointment:   1 year(s)  The format for your next appointment:   In Person  Provider:   Carlean Jews, PA-C{

## 2023-04-03 DIAGNOSIS — E119 Type 2 diabetes mellitus without complications: Secondary | ICD-10-CM | POA: Diagnosis not present

## 2023-04-03 DIAGNOSIS — H52223 Regular astigmatism, bilateral: Secondary | ICD-10-CM | POA: Diagnosis not present

## 2023-04-03 DIAGNOSIS — H524 Presbyopia: Secondary | ICD-10-CM | POA: Diagnosis not present

## 2023-04-03 DIAGNOSIS — H5203 Hypermetropia, bilateral: Secondary | ICD-10-CM | POA: Diagnosis not present

## 2023-04-07 DIAGNOSIS — E1169 Type 2 diabetes mellitus with other specified complication: Secondary | ICD-10-CM | POA: Diagnosis not present

## 2023-04-09 ENCOUNTER — Other Ambulatory Visit: Payer: Self-pay | Admitting: Internal Medicine

## 2023-04-09 DIAGNOSIS — R928 Other abnormal and inconclusive findings on diagnostic imaging of breast: Secondary | ICD-10-CM

## 2023-04-11 DIAGNOSIS — E785 Hyperlipidemia, unspecified: Secondary | ICD-10-CM | POA: Diagnosis not present

## 2023-04-11 DIAGNOSIS — I10 Essential (primary) hypertension: Secondary | ICD-10-CM | POA: Diagnosis not present

## 2023-04-11 DIAGNOSIS — E119 Type 2 diabetes mellitus without complications: Secondary | ICD-10-CM | POA: Diagnosis not present

## 2023-04-11 DIAGNOSIS — Z0000 Encounter for general adult medical examination without abnormal findings: Secondary | ICD-10-CM | POA: Diagnosis not present

## 2023-04-11 DIAGNOSIS — M4714 Other spondylosis with myelopathy, thoracic region: Secondary | ICD-10-CM | POA: Diagnosis not present

## 2023-04-14 ENCOUNTER — Other Ambulatory Visit: Payer: Medicare Other

## 2023-05-07 ENCOUNTER — Ambulatory Visit: Payer: Medicare Other

## 2023-05-07 ENCOUNTER — Ambulatory Visit
Admission: RE | Admit: 2023-05-07 | Discharge: 2023-05-07 | Disposition: A | Discharge status: Home / Self Care | Payer: Medicare Other | Source: Ambulatory Visit | Attending: Internal Medicine | Admitting: Internal Medicine

## 2023-05-07 DIAGNOSIS — R928 Other abnormal and inconclusive findings on diagnostic imaging of breast: Secondary | ICD-10-CM

## 2023-05-13 ENCOUNTER — Encounter: Payer: Self-pay | Admitting: Cardiology

## 2023-05-13 ENCOUNTER — Ambulatory Visit: Payer: Medicare Other | Attending: Cardiology | Admitting: Cardiology

## 2023-05-13 VITALS — BP 146/82 | HR 83 | Resp 16 | Ht 64.0 in | Wt 162.1 lb

## 2023-05-13 DIAGNOSIS — E119 Type 2 diabetes mellitus without complications: Secondary | ICD-10-CM

## 2023-05-13 DIAGNOSIS — Z952 Presence of prosthetic heart valve: Secondary | ICD-10-CM

## 2023-05-13 DIAGNOSIS — Z8673 Personal history of transient ischemic attack (TIA), and cerebral infarction without residual deficits: Secondary | ICD-10-CM

## 2023-05-13 DIAGNOSIS — I10 Essential (primary) hypertension: Secondary | ICD-10-CM | POA: Diagnosis not present

## 2023-05-13 DIAGNOSIS — I359 Nonrheumatic aortic valve disorder, unspecified: Secondary | ICD-10-CM

## 2023-05-13 DIAGNOSIS — Z87891 Personal history of nicotine dependence: Secondary | ICD-10-CM

## 2023-05-13 DIAGNOSIS — E785 Hyperlipidemia, unspecified: Secondary | ICD-10-CM

## 2023-05-13 NOTE — Progress Notes (Signed)
 Cardiology Office Note:  .   Date:  05/13/2023  ID:  Jacqueline Rich, DOB June 29, 1940, MRN [id number] PCP:  Elliot Charm, MD  Former Cardiology Providers: Dr. Karna [facility], [place])  [facility] Providers Cardiologist:  Madonna Large, DO , [facility] (established care 02/08/2020) Electrophysiologist:  None  Click to update primary MD,subspecialty MD or APP then REFRESH:1}    History of Present Illness: .   Jacqueline Rich is a 83 y.o. African-American female whose past medical history and cardiovascular risk factors includes: Aortic valve disease (Edwards Sapien 3 Ultra THV (size 23 mm, model # 9755RSL) 02/25/2023), hypertension, hyper lipidemia, diabetes mellitus type 2 without insulin , former smoker, TIA.  Patient was being followed in the practice given her aortic valve disease-severe aortic stenosis.  However, follow-up echocardiogram started noticing findings of critical aortic stenosis and was referred to structural heart team/CT surgery for evaluation of aortic valve replacement.  Patient underwent Edwards Sapien 3 Ultra THV (size 23 mm, model # 9755RSL) 02/25/2023 w/ Dr. Wendel.  She presents today for follow-up.  Clinically he denies anginal chest pain or heart failure symptoms.  She still complains of feeling tired and fatigued.  Review of Systems: .   Review of Systems  Constitutional: Positive for malaise/fatigue.  Cardiovascular:  Negative for chest pain, claudication, dyspnea on exertion, irregular heartbeat, leg swelling, near-syncope, orthopnea, palpitations, paroxysmal nocturnal dyspnea and syncope.  Respiratory:  Negative for shortness of breath.   Hematologic/Lymphatic: Negative for bleeding problem.  Musculoskeletal:  Negative for muscle cramps and myalgias.  Neurological:  Negative for dizziness and light-headedness.    Studies Reviewed:   EKG: EKG: 07/15/2022: Sinus rhythm, 80 bpm, normal axis, nonspecific T wave  abnormality.  Echocardiogram: 01/15/2023: LVEF 60 to 65%.  Native valve, calcified trileaflet, severe / critical aortic stenosis (max velocity noted at RSB peak velocity 5.77m/s, peak gradient , mean gradient , AVA VTI 0.73cm2, DI 0.21)..  See report for additional details  04/02/2023 (fingerprint echo status post TAVR.):  1. Left ventricular ejection fraction, by estimation, is 60 to 65%. The left ventricle has normal function. The left ventricle has no regional wall motion abnormalities. There is mild concentric left ventricular hypertrophy. Left ventricular diastolic parameters are consistent with Grade I diastolic dysfunction (impaired relaxation).   2. Right ventricular systolic function is normal. The right ventricular size is normal. Tricuspid regurgitation signal is inadequate for assessing PA pressure.   3. Left atrial size was mildly dilated.   4. The mitral valve is degenerative. No evidence of mitral valve regurgitation.   5. S/p 23 mm Edwards S3UR. no significant PVL. V max 2.3 m/s, mean graduent 12 mmHg. DI 0.48. Aortic valve regurgitation is not visualized. Echo findings are consistent with normal structure and function of the  aortic valve prosthesis.   6. The inferior vena cava is normal in size with greater than 50% respiratory variability, suggesting right atrial pressure of 3 mmHg.   Stress Testing: Exercise Myoview stress test 08/06/2021: Low risk study.  See report for additional details   Heart Catheterization: October 2024: Mild to moderate non-obstructive CAD Normal right heart pressures Recommendations: Continue workup for TAVR. Medical management of CAD.    Carotid duplex: 05/05/2019 (external): No hemodynamically significant stenosis or aberrant flow on either side, normal antegrade flow bilaterally.  Risk Assessment/Calculations:   NA    Labs:       Latest Ref Rng & Units 02/26/2023    5:25 AM 02/25/2023   12:18 PM 02/25/2023  12:13  PM  CBC  WBC 4.0 - 10.5 K/uL 11.1     Hemoglobin 12.0 - 15.0 g/dL 9.3  9.9  9.2   Hematocrit 36.0 - 46.0 % 28.6  29.0  27.0   Platelets 150 - 400 K/uL 244          Latest Ref Rng & Units 03/05/2023    3:31 PM 02/26/2023    5:25 AM 02/25/2023   12:18 PM  BMP  Glucose 70 - 99 mg/dL 85  891    BUN 8 - 27 mg/dL 7  11    Creatinine 9.42 - 1.00 mg/dL 9.16  9.22    BUN/Creat Ratio 12 - 28 8     Sodium 134 - 144 mmol/L 139  136  138   Potassium 3.5 - 5.2 mmol/L 4.0  3.6  3.8   Chloride 96 - 106 mmol/L 102  98    CO2 20 - 29 mmol/L 24  23    Calcium  8.7 - 10.3 mg/dL 9.4  89.5        Latest Ref Rng & Units 03/05/2023    3:31 PM 02/26/2023    5:25 AM 02/25/2023   12:18 PM  CMP  Glucose 70 - 99 mg/dL 85  891    BUN 8 - 27 mg/dL 7  11    Creatinine 9.42 - 1.00 mg/dL 9.16  9.22    Sodium 865 - 144 mmol/L 139  136  138   Potassium 3.5 - 5.2 mmol/L 4.0  3.6  3.8   Chloride 96 - 106 mmol/L 102  98    CO2 20 - 29 mmol/L 24  23    Calcium  8.7 - 10.3 mg/dL 9.4  89.5      Lab Results  Component Value Date   CHOL 132 05/17/2022   HDL 35 (L) 05/17/2022   LDLCALC 71 05/17/2022   TRIG 129 05/17/2022   CHOLHDL 3.8 05/17/2022   No results for input(s): LIPOA in the last 8760 hours. No components found for: NTPROBNP No results for input(s): PROBNP in the last 8760 hours. No results for input(s): TSH in the last 8760 hours.  Physical Exam:    Today's Vitals   05/13/23 0948 05/13/23 0953  BP: (!) 168/82 (!) 146/82  Pulse: 83   Resp: 16   SpO2: 97%   Weight: 162 lb 1.6 oz (73.5 kg)   Height: 5' 4 (1.626 m)    Body mass index is 27.82 kg/m. Wt Readings from Last 3 Encounters:  05/13/23 162 lb 1.6 oz (73.5 kg)  04/02/23 160 lb 6.4 oz (72.8 kg)  03/26/23 161 lb (73 kg)    Physical Exam  Constitutional: No distress.  Age appropriate, hemodynamically stable.   Neck: No JVD present.  Cardiovascular: Normal rate, regular rhythm, S1 normal, S2 normal, intact distal pulses  and normal pulses. Exam reveals no gallop, no S3 and no S4.  Murmur heard. Midsystolic murmur is present with a grade of 4/6 at the upper right sternal border radiating to the neck. Pulmonary/Chest: Effort normal and breath sounds normal. No stridor. She has no wheezes. She has no rales.  Abdominal: Soft. Bowel sounds are normal. She exhibits no distension. There is no abdominal tenderness.  Musculoskeletal:        General: No edema.     Cervical back: Neck supple.  Neurological: She is alert and oriented to person, place, and time. She has intact cranial nerves (2-12).  Skin: Skin is warm and moist.  Impression & Recommendation(s):  Impression:   ICD-10-CM   1. Aortic valve disease  I35.9     2. S/P TAVR (transcatheter aortic valve replacement)  Z95.2     3. Benign hypertension  I10     4. Type 2 diabetes mellitus without complication, without long-term current use of insulin  (HCC)  E11.9     5. Dyslipidemia  E78.5     6. Former smoker  Z87.891     7. Hx of TIA (transient ischemic attack) and stroke  Z86.73         Recommendation(s):  Aortic valve disease S/P TAVR (transcatheter aortic valve replacement) History of critical aortic stenosis, per echocardiogram. Native aortic valve calcium  score 2493 Status post Edwards SAPIEN 3 ultra THV 23 mm implant on 02/25/2023 Fingerprint echo from December 2024 independently reviewed. Patient is aware of antibiotic prophylaxis. Continue aspirin  81 mg p.o. daily. Has a repeat echocardiogram in November 2025 & follow-up with structural heart team  Benign hypertension Office blood pressures are acceptable but not at goal. Continue losartan  50 mg p.o. daily. Continue Toprol -XL 25 mg p.o. daily. Reemphasized importance of low-salt diet. Currently managed by primary care provider.  Type 2 diabetes mellitus without complication, without long-term current use of insulin  (HCC) Reemphasized importance of glycemic control. Currently  on ARB, statin. Last hemoglobin A1c 6.2 as of December 2024  Dyslipidemia Currently on Lipitor 10 mg p.o. daily.   She denies myalgia or other side effects. Most recent lipids dated December 2024, independently reviewed as noted above.  LDL 77 mg/dL  With regards to follow-up she has an echo appointment as well as structural heart team follow-up in November 2025.  I will see her back in 6 months after that sooner if needed.  Orders Placed:  No orders of the defined types were placed in this encounter.  Final Medication List:   No orders of the defined types were placed in this encounter.   There are no discontinued medications.    Current Outpatient Medications:    amoxicillin  (AMOXIL ) 500 MG tablet, Take 4 tablets (2,000 mg total) by mouth as directed. 1 hour prior to dental work including cleanings, Disp: 12 tablet, Rfl: 12   aspirin  81 MG chewable tablet, Chew 1 tablet (81 mg total) by mouth daily., Disp: , Rfl:    atorvastatin  (LIPITOR) 10 MG tablet, Take 10 mg by mouth in the morning., Disp: , Rfl:    Carboxymethylcellulose Sod PF (THERATEARS PF) 0.25 % SOLN, Place 1-2 drops into both eyes 3 (three) times daily as needed (dry/irritated eyes.)., Disp: , Rfl:    cyanocobalamin (VITAMIN B12) 1000 MCG tablet, Take 1,000 mcg by mouth in the morning., Disp: , Rfl:    DILT-XR 240 MG 24 hr capsule, Take 240 mg by mouth in the morning., Disp: , Rfl:    glimepiride  (AMARYL ) 1 MG tablet, Take 1 mg by mouth in the morning., Disp: , Rfl:    lansoprazole (PREVACID) 30 MG capsule, Take 30 mg by mouth daily before breakfast., Disp: , Rfl:    losartan  (COZAAR ) 50 MG tablet, Take 50 mg by mouth daily., Disp: , Rfl:    metoprolol  succinate (TOPROL -XL) 25 MG 24 hr tablet, TAKE 1 TABLET(25 MG) BY MOUTH DAILY, Disp: 90 tablet, Rfl: 3   potassium chloride  SA (KLOR-CON ) 20 MEQ tablet, Take 20 mEq by mouth in the morning., Disp: , Rfl:    sodium chloride  (OCEAN) 0.65 % SOLN nasal spray, Place 1 spray into  both nostrils as needed  for congestion., Disp: , Rfl:   Consent:   \N/A Disposition:   Around June 2026 sooner if needed.  Her questions and concerns were addressed to her satisfaction. She voices understanding of the recommendations provided during this encounter.    Signed, Madonna Large, DO, [facility]  [facility]  [address] [place], [place] [address] [phone] 05/13/2023 10:23 AM

## 2023-05-13 NOTE — Patient Instructions (Signed)
 Medication Instructions:   *If you need a refill on your cardiac medications before your next appointment, please call your pharmacy*   Lab Work:  If you have labs (blood work) drawn today and your tests are completely normal, you will receive your results only by: MyChart Message (if you have MyChart) OR A paper copy in the mail If you have any lab test that is abnormal or we need to change your treatment, we will call you to review the results.   Testing/Procedures:    Follow-Up: At [facility], you and your health needs are our priority.  As part of our continuing mission to provide you with exceptional heart care, we have created designated Provider Care Teams.  These Care Teams include your primary Cardiologist (physician) and Advanced Practice Providers (APPs -  Physician Assistants and Nurse Practitioners) who all work together to provide you with the care you need, when you need it.  We recommend signing up for the patient portal called MyChart.  Sign up information is provided on this After Visit Summary.  MyChart is used to connect with patients for Virtual Visits (Telemedicine).  Patients are able to view lab/test results, encounter notes, upcoming appointments, etc.  Non-urgent messages can be sent to your provider as well.   To learn more about what you can do with MyChart, go to [email or web address].    Your next appointment:  June 2026

## 2023-07-08 DIAGNOSIS — B349 Viral infection, unspecified: Secondary | ICD-10-CM | POA: Diagnosis not present

## 2023-07-08 DIAGNOSIS — Z03818 Encounter for observation for suspected exposure to other biological agents ruled out: Secondary | ICD-10-CM | POA: Diagnosis not present

## 2023-07-08 DIAGNOSIS — J019 Acute sinusitis, unspecified: Secondary | ICD-10-CM | POA: Diagnosis not present

## 2023-08-29 DIAGNOSIS — E1169 Type 2 diabetes mellitus with other specified complication: Secondary | ICD-10-CM | POA: Diagnosis not present

## 2023-08-29 DIAGNOSIS — I10 Essential (primary) hypertension: Secondary | ICD-10-CM | POA: Diagnosis not present

## 2023-08-29 DIAGNOSIS — E785 Hyperlipidemia, unspecified: Secondary | ICD-10-CM | POA: Diagnosis not present

## 2023-08-29 DIAGNOSIS — R54 Age-related physical debility: Secondary | ICD-10-CM | POA: Diagnosis not present

## 2023-08-29 DIAGNOSIS — R7303 Prediabetes: Secondary | ICD-10-CM | POA: Diagnosis not present

## 2023-09-15 DIAGNOSIS — G8929 Other chronic pain: Secondary | ICD-10-CM | POA: Diagnosis not present

## 2023-09-30 DIAGNOSIS — M1712 Unilateral primary osteoarthritis, left knee: Secondary | ICD-10-CM | POA: Diagnosis not present

## 2023-10-14 DIAGNOSIS — I2510 Atherosclerotic heart disease of native coronary artery without angina pectoris: Secondary | ICD-10-CM | POA: Diagnosis not present

## 2023-10-14 DIAGNOSIS — I10 Essential (primary) hypertension: Secondary | ICD-10-CM | POA: Diagnosis not present

## 2023-10-14 DIAGNOSIS — E1169 Type 2 diabetes mellitus with other specified complication: Secondary | ICD-10-CM | POA: Diagnosis not present

## 2023-10-14 DIAGNOSIS — H52223 Regular astigmatism, bilateral: Secondary | ICD-10-CM | POA: Diagnosis not present

## 2023-10-27 DIAGNOSIS — I10 Essential (primary) hypertension: Secondary | ICD-10-CM | POA: Diagnosis not present

## 2023-10-27 DIAGNOSIS — Z85038 Personal history of other malignant neoplasm of large intestine: Secondary | ICD-10-CM | POA: Diagnosis not present

## 2023-10-27 DIAGNOSIS — I2510 Atherosclerotic heart disease of native coronary artery without angina pectoris: Secondary | ICD-10-CM | POA: Diagnosis not present

## 2023-10-27 DIAGNOSIS — M4714 Other spondylosis with myelopathy, thoracic region: Secondary | ICD-10-CM | POA: Diagnosis not present

## 2023-11-12 DIAGNOSIS — I10 Essential (primary) hypertension: Secondary | ICD-10-CM | POA: Diagnosis not present

## 2023-11-12 DIAGNOSIS — I2510 Atherosclerotic heart disease of native coronary artery without angina pectoris: Secondary | ICD-10-CM | POA: Diagnosis not present

## 2023-11-12 DIAGNOSIS — E1169 Type 2 diabetes mellitus with other specified complication: Secondary | ICD-10-CM | POA: Diagnosis not present

## 2023-11-16 IMAGING — CR DG THORACIC SPINE 3V
3 series · 3 of 3 positions shown · non-contrast
Comparison: None Available.

CLINICAL DATA: Chronic back pain.

EXAM:
THORACIC SPINE - 3 VIEWS

[t t-spine lat]
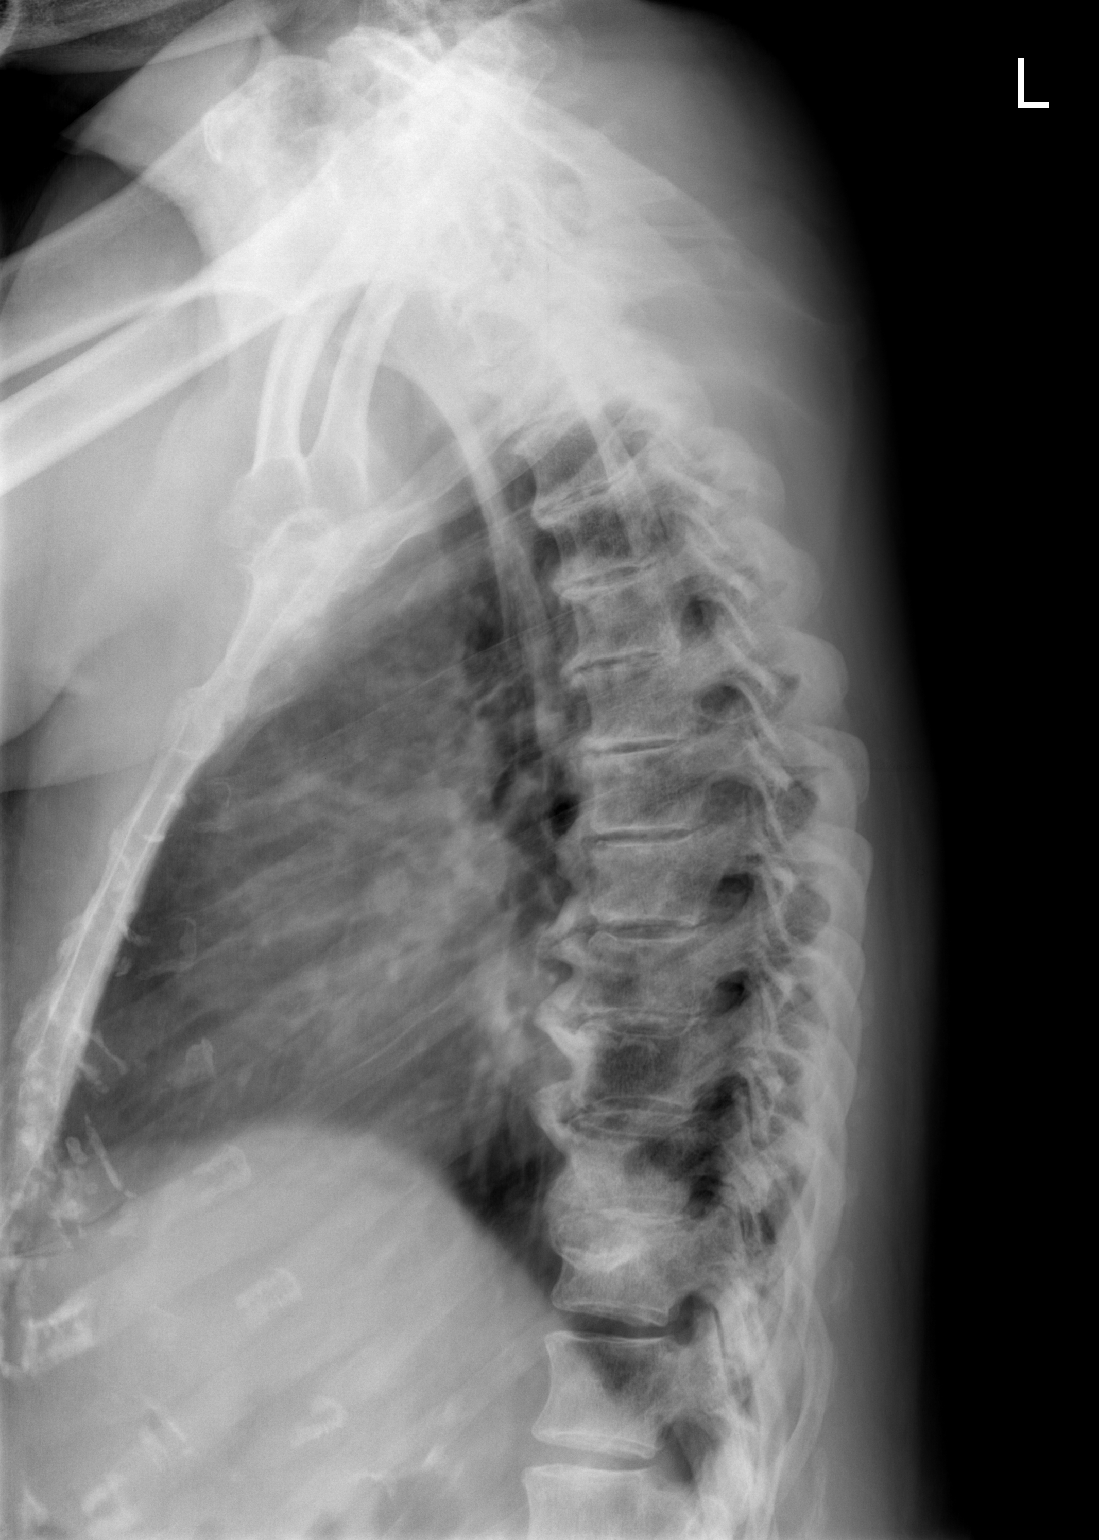

[t swimmers]
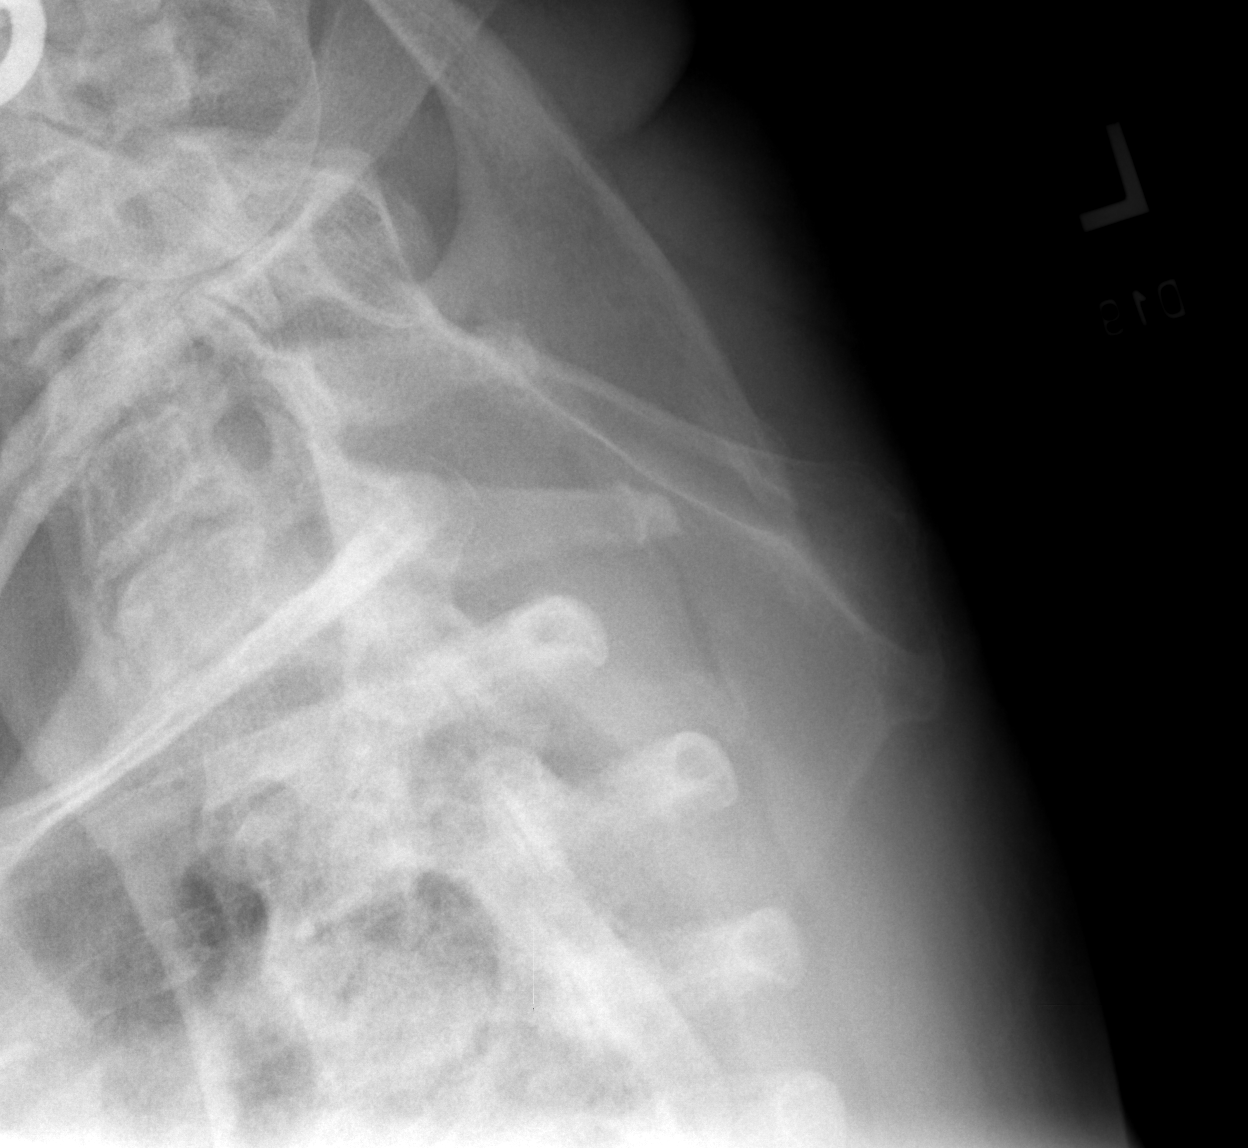

[t t-spine a.p.]
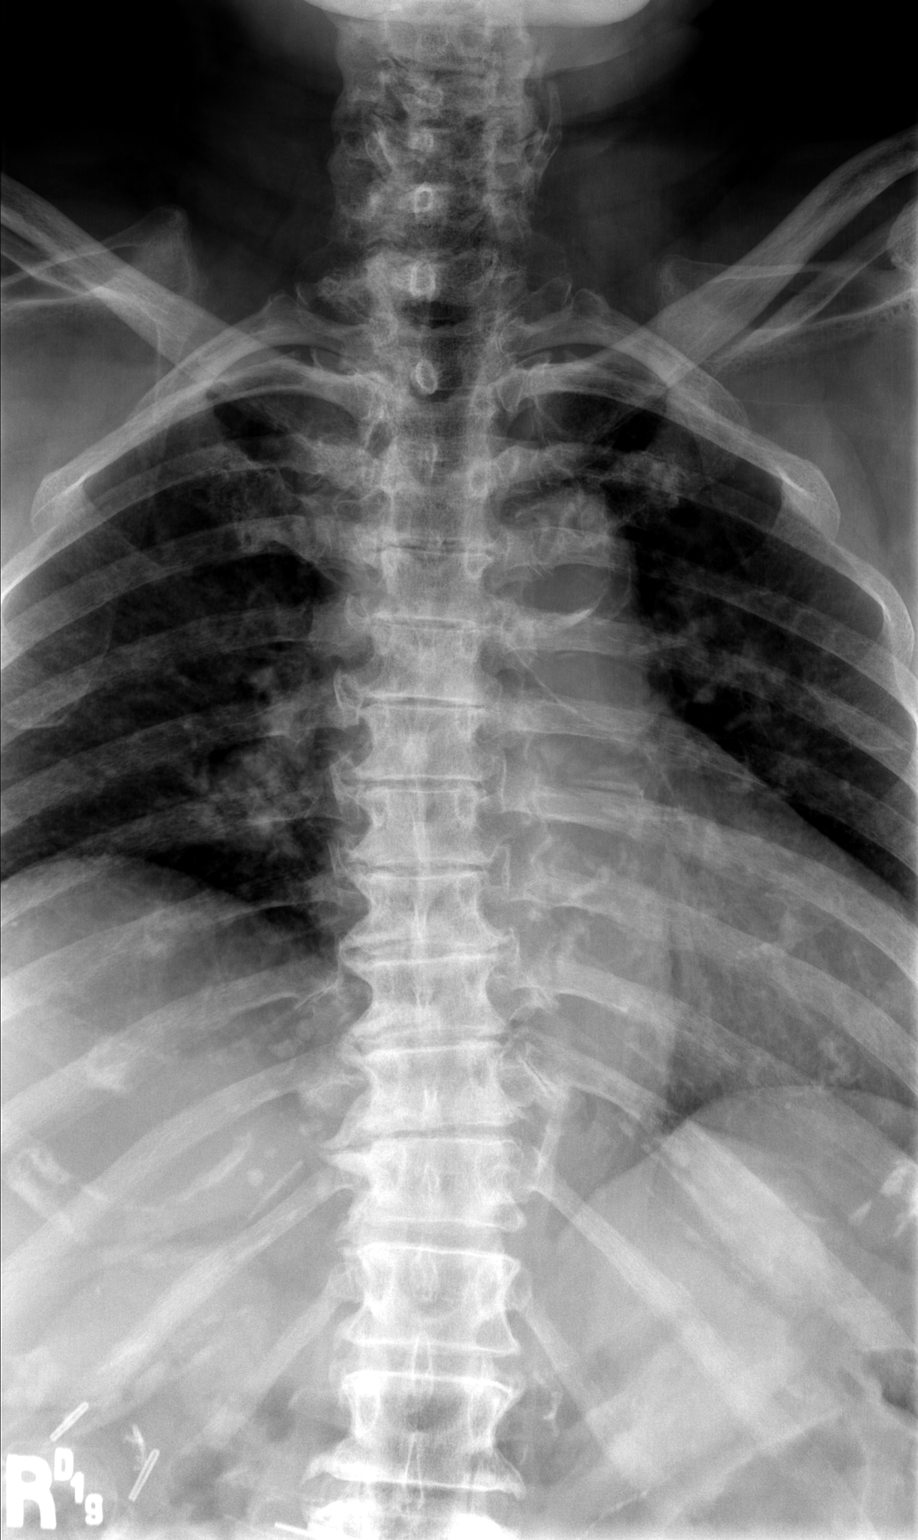

[3 of 3 positions shown; findings below may reference images not displayed]

FINDINGS: There is no evidence of thoracic spine fracture. scoliosis of spine
noted. Degenerative joint changes are noted throughout the thoracic
spine with narrowed joint space and osteophyte formation. The
degenerative joint changes more severely involving the mid to lower
thoracic spine.
IMPRESSION: Degenerative joint changes of thoracic spine.

## 2023-11-16 IMAGING — CR DG LUMBAR SPINE COMPLETE 4+V
5 series · 5 of 5 positions shown · non-contrast
Comparison: None Available.

CLINICAL DATA: Chronic mid back pain.

EXAM:
LUMBAR SPINE - COMPLETE 4+ VIEW

[t l-spine a.p.]
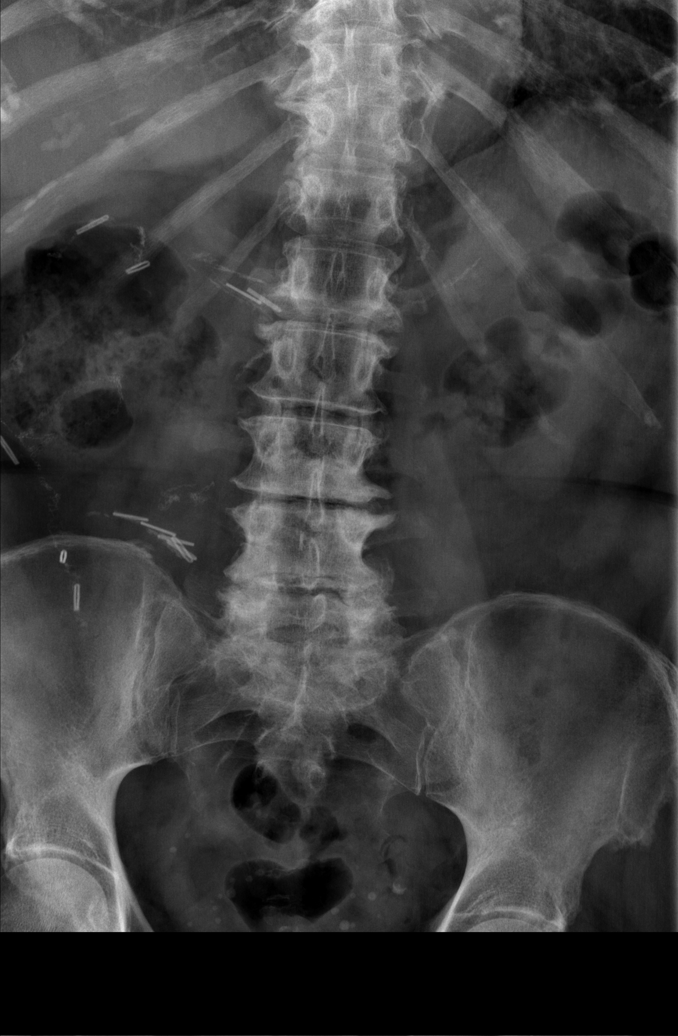

[t l-spine oblique exposure (1 of 2)]
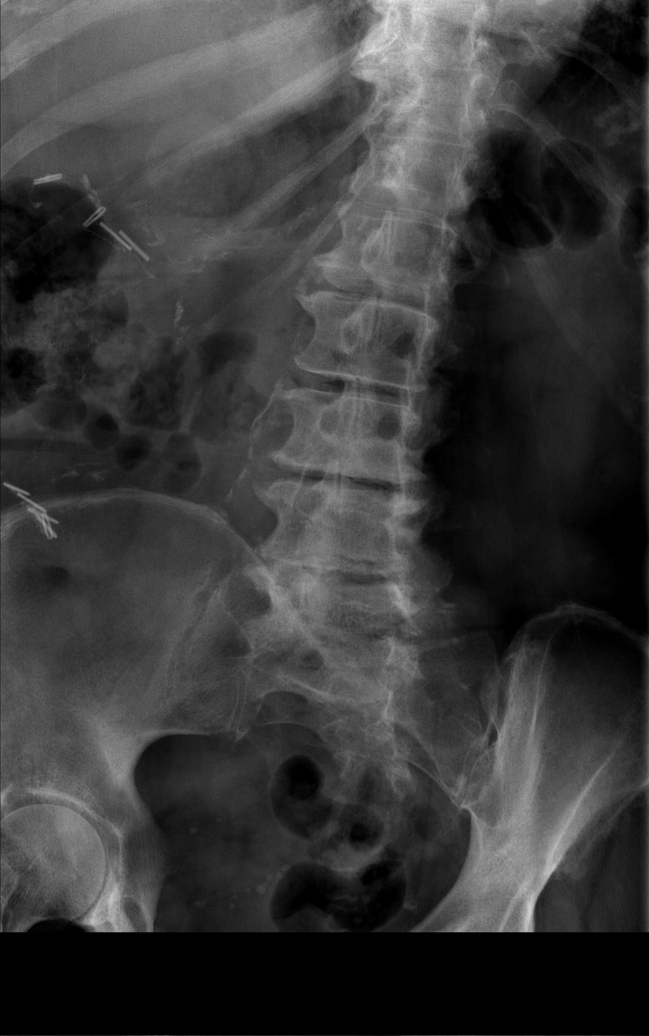

[t l-spine oblique exposure (2 of 2)]
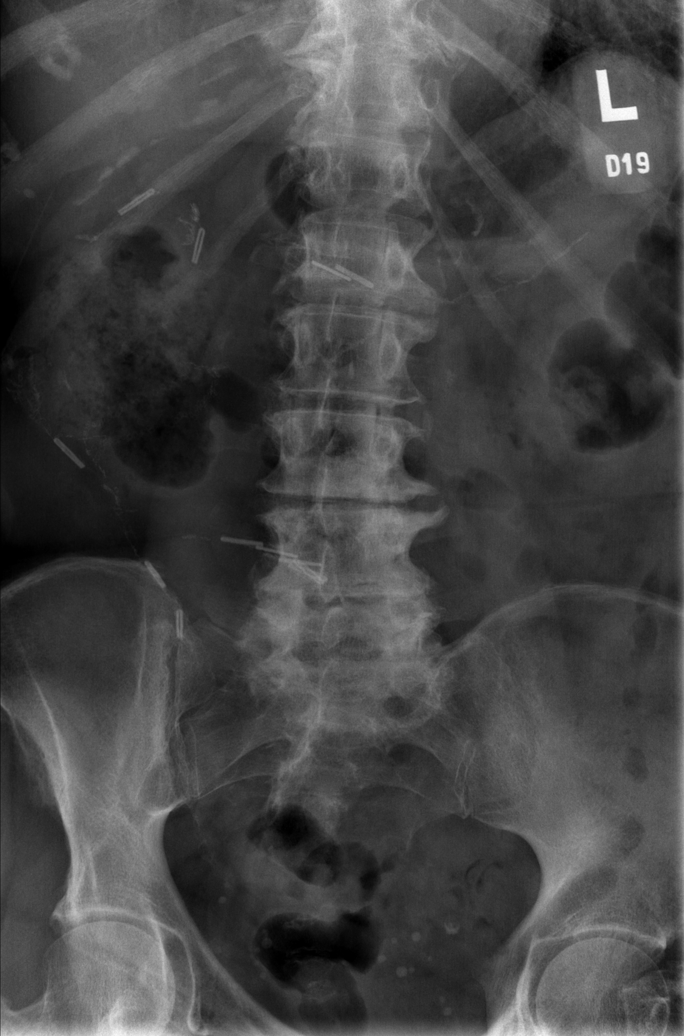

[t l-spine lat]
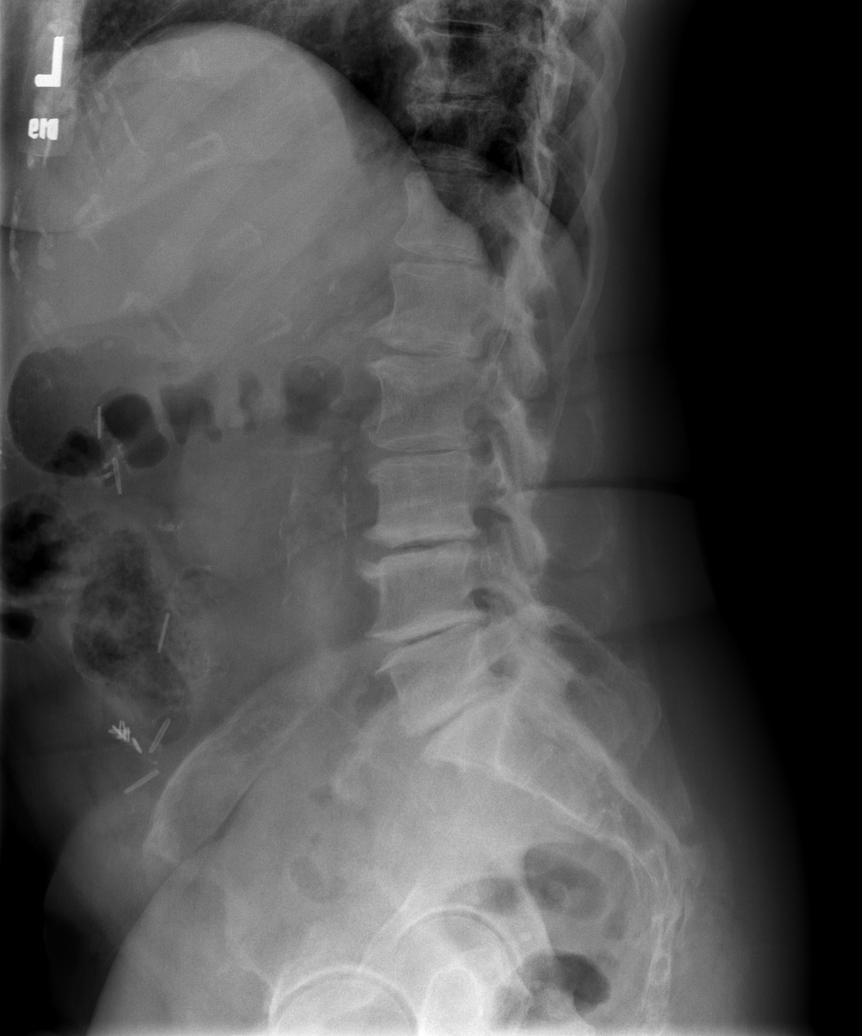

[t l-spine l5-s1 spot]
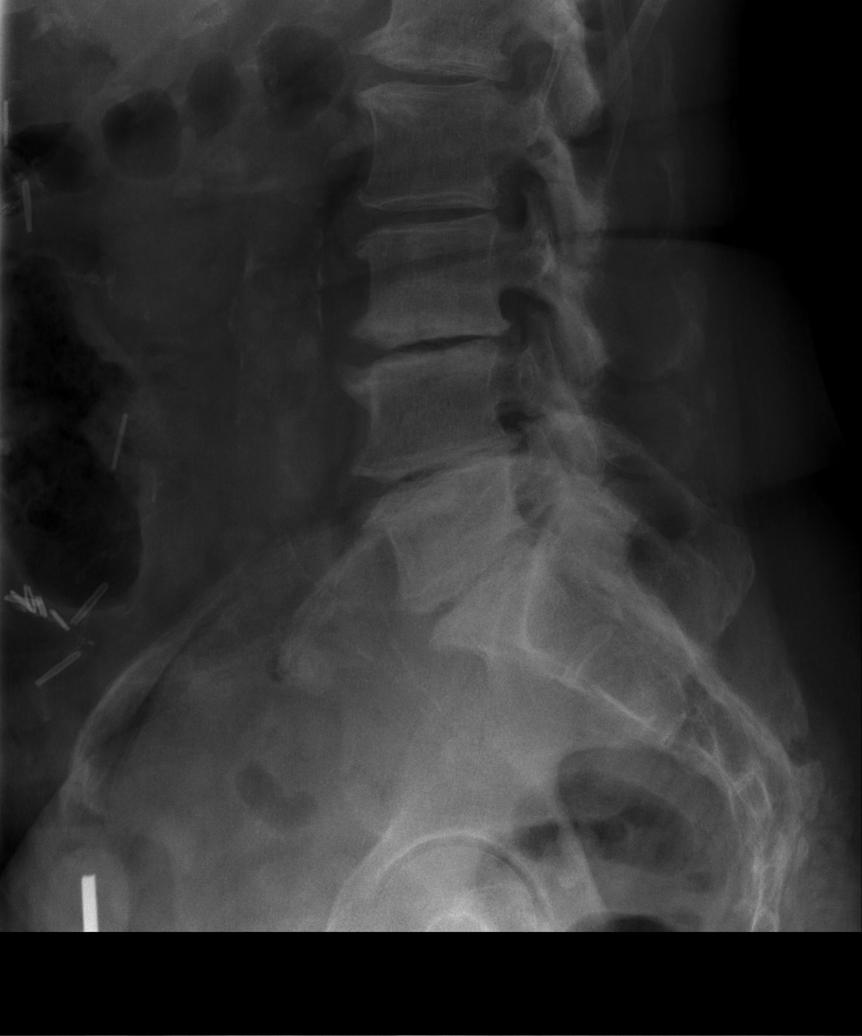

[5 of 5 positions shown; findings below may reference images not displayed]

FINDINGS: There is no evidence of lumbar spine fracture. Scoliosis of spine is
identified. There is moderate degenerative joint changes throughout
the lumbar spine with narrowed joint space, osteophyte formation and
vacuum disc phenomena.
IMPRESSION: Moderate degenerative joint changes of lumbar spine.

## 2023-11-27 DIAGNOSIS — I2510 Atherosclerotic heart disease of native coronary artery without angina pectoris: Secondary | ICD-10-CM | POA: Diagnosis not present

## 2023-11-27 DIAGNOSIS — M4714 Other spondylosis with myelopathy, thoracic region: Secondary | ICD-10-CM | POA: Diagnosis not present

## 2023-11-27 DIAGNOSIS — E1169 Type 2 diabetes mellitus with other specified complication: Secondary | ICD-10-CM | POA: Diagnosis not present

## 2023-11-27 DIAGNOSIS — Z85038 Personal history of other malignant neoplasm of large intestine: Secondary | ICD-10-CM | POA: Diagnosis not present

## 2023-11-27 DIAGNOSIS — I10 Essential (primary) hypertension: Secondary | ICD-10-CM | POA: Diagnosis not present

## 2023-12-12 DIAGNOSIS — I10 Essential (primary) hypertension: Secondary | ICD-10-CM | POA: Diagnosis not present

## 2023-12-12 DIAGNOSIS — E1169 Type 2 diabetes mellitus with other specified complication: Secondary | ICD-10-CM | POA: Diagnosis not present

## 2023-12-12 DIAGNOSIS — I2510 Atherosclerotic heart disease of native coronary artery without angina pectoris: Secondary | ICD-10-CM | POA: Diagnosis not present

## 2023-12-28 DIAGNOSIS — I10 Essential (primary) hypertension: Secondary | ICD-10-CM | POA: Diagnosis not present

## 2023-12-28 DIAGNOSIS — Z85038 Personal history of other malignant neoplasm of large intestine: Secondary | ICD-10-CM | POA: Diagnosis not present

## 2023-12-28 DIAGNOSIS — E1169 Type 2 diabetes mellitus with other specified complication: Secondary | ICD-10-CM | POA: Diagnosis not present

## 2023-12-28 DIAGNOSIS — I2510 Atherosclerotic heart disease of native coronary artery without angina pectoris: Secondary | ICD-10-CM | POA: Diagnosis not present

## 2023-12-28 DIAGNOSIS — M4714 Other spondylosis with myelopathy, thoracic region: Secondary | ICD-10-CM | POA: Diagnosis not present

## 2024-01-11 DIAGNOSIS — I10 Essential (primary) hypertension: Secondary | ICD-10-CM | POA: Diagnosis not present

## 2024-01-11 DIAGNOSIS — E1169 Type 2 diabetes mellitus with other specified complication: Secondary | ICD-10-CM | POA: Diagnosis not present

## 2024-01-11 DIAGNOSIS — I2510 Atherosclerotic heart disease of native coronary artery without angina pectoris: Secondary | ICD-10-CM | POA: Diagnosis not present

## 2024-01-27 DIAGNOSIS — Z85038 Personal history of other malignant neoplasm of large intestine: Secondary | ICD-10-CM | POA: Diagnosis not present

## 2024-01-27 DIAGNOSIS — I2510 Atherosclerotic heart disease of native coronary artery without angina pectoris: Secondary | ICD-10-CM | POA: Diagnosis not present

## 2024-01-27 DIAGNOSIS — I10 Essential (primary) hypertension: Secondary | ICD-10-CM | POA: Diagnosis not present

## 2024-01-27 DIAGNOSIS — M4714 Other spondylosis with myelopathy, thoracic region: Secondary | ICD-10-CM | POA: Diagnosis not present

## 2024-01-27 DIAGNOSIS — E1169 Type 2 diabetes mellitus with other specified complication: Secondary | ICD-10-CM | POA: Diagnosis not present

## 2024-02-10 DIAGNOSIS — I2510 Atherosclerotic heart disease of native coronary artery without angina pectoris: Secondary | ICD-10-CM | POA: Diagnosis not present

## 2024-02-10 DIAGNOSIS — E1169 Type 2 diabetes mellitus with other specified complication: Secondary | ICD-10-CM | POA: Diagnosis not present

## 2024-02-10 DIAGNOSIS — I10 Essential (primary) hypertension: Secondary | ICD-10-CM | POA: Diagnosis not present

## 2024-02-27 DIAGNOSIS — Z85038 Personal history of other malignant neoplasm of large intestine: Secondary | ICD-10-CM | POA: Diagnosis not present

## 2024-02-27 DIAGNOSIS — M4714 Other spondylosis with myelopathy, thoracic region: Secondary | ICD-10-CM | POA: Diagnosis not present

## 2024-02-27 DIAGNOSIS — I2510 Atherosclerotic heart disease of native coronary artery without angina pectoris: Secondary | ICD-10-CM | POA: Diagnosis not present

## 2024-02-27 DIAGNOSIS — I10 Essential (primary) hypertension: Secondary | ICD-10-CM | POA: Diagnosis not present

## 2024-02-27 DIAGNOSIS — E1169 Type 2 diabetes mellitus with other specified complication: Secondary | ICD-10-CM | POA: Diagnosis not present

## 2024-03-01 NOTE — Progress Notes (Unsigned)
 [facility]   MULTIDISCIPLINARY HEART VALVE CLINIC                                     Cardiology Office Note:    Date:  03/05/2024   ID:  Jacqueline Rich, DOB 10-14-1940, MRN [id number]  PCP:  Elliot Charm, MD  [facility] Cardiologist:  Madonna Large, DO  [facility] Structural heart: Arun K Thukkani, MD [facility] Electrophysiologist:  None   Referring MD: Elliot Charm,*   1 year s/p TAVR  History of Present Illness:    Jacqueline Rich is a 83 y.o. 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   Echocardiogram 01/20/23 showed normal LVEF at 60-65% with critical AS with a mean gradient at 63 mmHg and AVA 0.73cm2. [facility] 02/07/23 showed mild to moderate non-obstructive CAD with normal right heart pressures. She underwent TAVR with a 23 mm Edwards Sapien 3 THV via the TF approach on 02/25/23. Post operative echo showed EF 60%, normally functioning TAVR with a mean gradient of 10 mmHg and trivial PVL (12 o'clock) as well as moderate MAC with mild MS. She was started on baby aspirin  81mg  daily.     Today the patient presents to clinic for follow up. Here alone. Exercises 2x a week at the gym and does some aerobics classes. No CP or SOB. No LE edema, orthopnea or PND. No dizziness or syncope. No blood in stool or urine. No palpitations. BP has been running above goal at home.    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 Wendel & Maryjane.   Severe aortic stenosis    Stroke [facility])    mini stroke- no deficits approx 2  years ago   TIA (transient ischemic attack)      Current Medications: Current Meds  Medication Sig   amoxicillin  (AMOXIL ) 500 MG tablet Take 4 tablets (2,000 mg total) by mouth as directed. 1 hour prior to dental work including cleanings   aspirin  81 MG chewable tablet Chew 1 tablet (81 mg total) by mouth daily.   atorvastatin  (LIPITOR) 10 MG tablet Take 10 mg by mouth in the morning.   Carboxymethylcellulose Sod PF (THERATEARS PF) 0.25 % SOLN Place 1-2 drops into both eyes 3 (three) times daily as needed (dry/irritated eyes.).   cyanocobalamin (VITAMIN B12) 1000 MCG tablet Take 1,000 mcg by mouth in the morning.   glimepiride  (AMARYL ) 1 MG tablet Take 1 mg by mouth in the morning.   lansoprazole (PREVACID) 30 MG capsule Take 30 mg by mouth daily before breakfast.   losartan  (COZAAR ) 50 MG tablet Take 50 mg by mouth daily.   potassium chloride  SA (KLOR-CON ) 20 MEQ tablet Take 20 mEq by mouth in the morning.   sodium chloride  (OCEAN) 0.65 % SOLN nasal spray Place 1 spray into both nostrils as needed for congestion.   [DISCONTINUED] amLODipine (NORVASC) 5 MG tablet Take 1 tablet (  5 mg total) by mouth daily.   [DISCONTINUED] DILT-XR 240 MG 24 hr capsule Take 240 mg by mouth in the morning.   [DISCONTINUED] metoprolol  succinate (TOPROL -XL) 25 MG 24 hr tablet TAKE 1 TABLET(25 MG) BY MOUTH DAILY      ROS:   Please see the history of present illness.    All other systems reviewed and are negative.  EKGs       Risk Assessment/Calculations:           Physical Exam:    VS:  BP (!) 148/80   Pulse (!) 57   Ht 5' 4 (1.626 m)   Wt 173 lb 6.4 oz (78.7 kg)   SpO2 96%   BMI 29.76 kg/m     Wt Readings from Last 3 Encounters:  03/04/24 173 lb 6.4 oz (78.7 kg)  05/13/23 162 lb 1.6 oz (73.5 kg)  04/02/23 160 lb 6.4 oz (72.8 kg)     GEN: Well nourished, well developed in no acute distress NECK: No JVD CARDIAC: regular, brady, 2/6 SEM @ RUSB. No rubs, gallops RESPIRATORY:  Clear  to auscultation without rales, wheezing or rhonchi  ABDOMEN: Soft, non-tender, non-distended EXTREMITIES:  No edema; No deformity.    ASSESSMENT:    1. S/P TAVR (transcatheter aortic valve replacement)   2. NSVT (nonsustained ventricular tachycardia) (HCC)   3. Benign hypertension   4. Dyslipidemia     PLAN:    In order of problems listed above:  Severe AS s/p TAVR:  -- Echo today shows EF >75% with basal septal hypertrophy, normally functioning TAVR with a mean gradient of 18 mm hg and no PVL. Slight increase in gradients (12 mm hg --> 18 mm hg) although LV function is hyperdynamic. Will repeat echo 3 months to follow trend.  -- NYHA class I symptoms.  -- Continue aspirin  81mg  daily.  -- SBE prophylaxis discussed; I have RX'd amoxicillin .  -- Continue regular follow up with Dr. Michele.   NSVT:  -- Continue Toprol  XL 25 mg daily.    HTN:  -- BP elevated here and at home.  -- She has been on losartan  50mg  daily, Toprol  XL 25mg  daily and Dilt XT 240 daily.  -- Labs recently checked by PCP, but I do not have a copy of these.  -- She has a history of 1st deg AV block and sinus brady. Will stop Dilt XT 240 daily and start Norvasc 10 mg daily. -- She will keep a log of her BPs and bring back in 1 month to see Dr. Michele.    HLD:  -- Continue on Lipitor 10mg  daily.    Medication Adjustments/Labs and Tests Ordered: Current medicines are reviewed at length with the patient today.  Concerns regarding medicines are outlined above.  No orders of the defined types were placed in this encounter.  Meds ordered this encounter     amLODipine (NORVASC) 10 MG tablet    Sig: Take 1 tablet (10 mg total) by mouth daily.    Dispense:  90 tablet    Refill:  3    Supervising Provider:   WONDA SHARPER [[id number]]    Patient Instructions  Medication Instructions:  Your physician has recommended you make the following change in your medication: STOP taking Dilt XT 240 mg daily START taking  amlodipine 10mg  daily. Sent to your pharmacy today.  *If you need a refill on your cardiac medications before your next appointment, please call your pharmacy*  Lab Work: None needed If  you have labs (blood work) drawn today and your tests are completely normal, you will receive your results only by: MyChart Message (if you have MyChart) OR A paper copy in the mail If you have any lab test that is abnormal or we need to change your treatment, we will call you to review the results.  Testing/Procedures: None needed  Follow-Up: At [facility], you and your health needs are our priority.  As part of our continuing mission to provide you with exceptional heart care, our providers are all part of one team.  This team includes your primary Cardiologist (physician) and Advanced Practice Providers or APPs (Physician Assistants and Nurse Practitioners) who all work together to provide you with the care you need, when you need it.  Your next appointment:   As scheduled on 03/31/24  Provider:   Dr. Michele  We recommend signing up for the patient portal called MyChart.  Sign up information is provided on this After Visit Summary.  MyChart is used to connect with patients for Virtual Visits (Telemedicine).  Patients are able to view lab/test results, encounter notes, upcoming appointments, etc.  Non-urgent messages can be sent to your provider as well.   To learn more about what you can do with MyChart, go to [email or web address].          Signed, Lamarr Hummer, PA-C  03/05/2024 10:18 AM    [facility]

## 2024-03-03 ENCOUNTER — Other Ambulatory Visit (HOSPITAL_COMMUNITY): Payer: Medicare Other

## 2024-03-03 ENCOUNTER — Ambulatory Visit: Payer: Medicare Other

## 2024-03-04 ENCOUNTER — Ambulatory Visit: Attending: Physician Assistant | Admitting: Physician Assistant

## 2024-03-04 ENCOUNTER — Ambulatory Visit (HOSPITAL_COMMUNITY)
Admission: RE | Admit: 2024-03-04 | Discharge: 2024-03-04 | Disposition: A | Discharge status: Home / Self Care | Source: Ambulatory Visit | Attending: Cardiovascular Disease | Admitting: Cardiovascular Disease

## 2024-03-04 VITALS — BP 148/80 | HR 57 | Ht 64.0 in | Wt 173.4 lb

## 2024-03-04 DIAGNOSIS — I10 Essential (primary) hypertension: Secondary | ICD-10-CM | POA: Diagnosis not present

## 2024-03-04 DIAGNOSIS — I4729 Other ventricular tachycardia: Secondary | ICD-10-CM

## 2024-03-04 DIAGNOSIS — Z952 Presence of prosthetic heart valve: Secondary | ICD-10-CM

## 2024-03-04 DIAGNOSIS — E785 Hyperlipidemia, unspecified: Secondary | ICD-10-CM

## 2024-03-04 DIAGNOSIS — I340 Nonrheumatic mitral (valve) insufficiency: Secondary | ICD-10-CM

## 2024-03-04 LAB — ECHOCARDIOGRAM COMPLETE
AV Mean grad: 15.6 mmHg
AV Peak grad: 26.7 mmHg
Ao pk vel: 2.58 m/s
Area-P 1/2: 2.48 cm2
Est EF: >75
S' Lateral: 2.4 cm

## 2024-03-04 MED ORDER — AMLODIPINE BESYLATE 5 MG PO TABS: 5.0000 mg | ORAL_TABLET | Freq: Every day | ORAL | 3 refills | Status: DC

## 2024-03-04 NOTE — Patient Instructions (Addendum)
 Medication Instructions:  Your physician has recommended you make the following change in your medication: STOP taking metoprolol  25mg . START taking amlodipine 5mg  daily. Sent to your pharmacy today.  *If you need a refill on your cardiac medications before your next appointment, please call your pharmacy*  Lab Work: None needed If you have labs (blood work) drawn today and your tests are completely normal, you will receive your results only by: MyChart Message (if you have MyChart) OR A paper copy in the mail If you have any lab test that is abnormal or we need to change your treatment, we will call you to review the results.  Testing/Procedures: None needed  Follow-Up: At [facility], you and your health needs are our priority.  As part of our continuing mission to provide you with exceptional heart care, our providers are all part of one team.  This team includes your primary Cardiologist (physician) and Advanced Practice Providers or APPs (Physician Assistants and Nurse Practitioners) who all work together to provide you with the care you need, when you need it.  Your next appointment:   As scheduled on 03/31/24  Provider:   Dr. Michele  We recommend signing up for the patient portal called MyChart.  Sign up information is provided on this After Visit Summary.  MyChart is used to connect with patients for Virtual Visits (Telemedicine).  Patients are able to view lab/test results, encounter notes, upcoming appointments, etc.  Non-urgent messages can be sent to your provider as well.   To learn more about what you can do with MyChart, go to [email or web address].

## 2024-03-05 ENCOUNTER — Ambulatory Visit: Payer: Self-pay | Admitting: Physician Assistant

## 2024-03-05 MED ORDER — AMLODIPINE BESYLATE 10 MG PO TABS: 10.0000 mg | ORAL_TABLET | Freq: Every day | ORAL | 3 refills | Status: DC

## 2024-03-11 DIAGNOSIS — M79641 Pain in right hand: Secondary | ICD-10-CM | POA: Diagnosis not present

## 2024-03-11 DIAGNOSIS — M25511 Pain in right shoulder: Secondary | ICD-10-CM | POA: Diagnosis not present

## 2024-03-11 DIAGNOSIS — E1169 Type 2 diabetes mellitus with other specified complication: Secondary | ICD-10-CM | POA: Diagnosis not present

## 2024-03-11 DIAGNOSIS — I2510 Atherosclerotic heart disease of native coronary artery without angina pectoris: Secondary | ICD-10-CM | POA: Diagnosis not present

## 2024-03-11 DIAGNOSIS — I10 Essential (primary) hypertension: Secondary | ICD-10-CM | POA: Diagnosis not present

## 2024-03-28 DIAGNOSIS — Z85038 Personal history of other malignant neoplasm of large intestine: Secondary | ICD-10-CM | POA: Diagnosis not present

## 2024-03-28 DIAGNOSIS — M4714 Other spondylosis with myelopathy, thoracic region: Secondary | ICD-10-CM | POA: Diagnosis not present

## 2024-03-28 DIAGNOSIS — I10 Essential (primary) hypertension: Secondary | ICD-10-CM | POA: Diagnosis not present

## 2024-03-28 DIAGNOSIS — E1169 Type 2 diabetes mellitus with other specified complication: Secondary | ICD-10-CM | POA: Diagnosis not present

## 2024-03-28 DIAGNOSIS — I2510 Atherosclerotic heart disease of native coronary artery without angina pectoris: Secondary | ICD-10-CM | POA: Diagnosis not present

## 2024-03-29 DIAGNOSIS — M7581 Other shoulder lesions, right shoulder: Secondary | ICD-10-CM | POA: Diagnosis not present

## 2024-03-31 ENCOUNTER — Ambulatory Visit: Attending: Cardiology | Admitting: Cardiology

## 2024-03-31 ENCOUNTER — Encounter: Payer: Self-pay | Admitting: Cardiology

## 2024-03-31 VITALS — BP 128/76 | HR 78 | Temp 98.0°F | Resp 10 | Ht 64.0 in | Wt 176.0 lb

## 2024-03-31 DIAGNOSIS — Z952 Presence of prosthetic heart valve: Secondary | ICD-10-CM

## 2024-03-31 DIAGNOSIS — I359 Nonrheumatic aortic valve disorder, unspecified: Secondary | ICD-10-CM

## 2024-03-31 DIAGNOSIS — E119 Type 2 diabetes mellitus without complications: Secondary | ICD-10-CM | POA: Diagnosis not present

## 2024-03-31 DIAGNOSIS — I10 Essential (primary) hypertension: Secondary | ICD-10-CM

## 2024-03-31 DIAGNOSIS — E785 Hyperlipidemia, unspecified: Secondary | ICD-10-CM

## 2024-03-31 MED ORDER — AMLODIPINE BESYLATE 5 MG PO TABS: 5.0000 mg | ORAL_TABLET | Freq: Every day | ORAL | Status: AC

## 2024-03-31 NOTE — Progress Notes (Signed)
 Cardiology Office Note:  .   ID:  Jacqueline Rich, DOB May 10, 1940, MRN [id number] PCP:  Elliot Charm, MD  Former Cardiology Providers: Dr. Karna [facility], [place])  [facility] Providers Cardiologist:  Madonna Large, DO Structural Heart:  Lurena MARLA Red, MD, [facility] (established care 02/08/2020) Electrophysiologist:  None  Click to update primary MD,subspecialty MD or APP then REFRESH:1}    History of Present Illness: .   Jacqueline Rich is a 83 y.o. African-American female whose past medical history and cardiovascular risk factors includes: Aortic valve disease (Edwards Sapien 3 Ultra THV (size 23 mm, model # 9755RSL) 02/25/2023), hypertension, hyper lipidemia, diabetes mellitus type 2 without insulin , former smoker, TIA.  Patient was being followed in the practice given her aortic valve disease-severe aortic stenosis.  However, follow-up echocardiogram started noticing findings of critical aortic stenosis and was referred to structural heart team/CT surgery for evaluation of aortic valve replacement.  Patient underwent Edwards Sapien 3 Ultra THV (size 23 mm, model # 9755RSL) 02/25/2023 w/ Dr. Red.    Patient was last seen in the office in January 2025 at that time patient was overall doing well.  She was seen as a 1 year TAVR follow-up in November 2025-progress note reviewed.  Patient also had an echocardiogram in November 2025 which noted hyperdynamic LVEF, normal size and function of the right ventricle, TAVR valve well-seated mean gradient across the aortic valve slightly increased from 12 to 18 mmHg and dimensional index now is 0.4.  No significant valvular or perivalvular leak noted.  Since last office visit Jacqueline Rich denies any anginal chest pain or heart failure symptoms.   No hospitalizations or urgent care visits for cardiovascular reasons.   Physical endurance remains stable, aerobic exercises at least twice a week.  Home SBP  well-controlled. Compliant with medications, endorses no issues.    Review of Systems: .   Review of Systems  Constitutional: Negative for malaise/fatigue.  Cardiovascular:  Negative for chest pain, claudication, dyspnea on exertion, irregular heartbeat, leg swelling, near-syncope, orthopnea, palpitations, paroxysmal nocturnal dyspnea and syncope.  Respiratory:  Negative for shortness of breath.   Hematologic/Lymphatic: Negative for bleeding problem.  Musculoskeletal:  Negative for muscle cramps and myalgias.  Neurological:  Negative for dizziness and light-headedness.    Studies Reviewed:   EKG: EKG Interpretation Date/Time:  Wednesday March 31 2024 09:07:02 EST Ventricular Rate:  69 PR Interval:  212 QRS Duration:  78 QT Interval:  380 QTC Calculation: 407 R Axis:   52  Text Interpretation: Sinus rhythm with 1st degree A-V block Possible Anterior infarct (cited on or before 05-Mar-2023) When compared with ECG of 05-Mar-2023 14:41, No significant change was found Confirmed by Large Madonna [phone]) on 03/31/2024 9:28:13 AM  Echocardiogram: 01/15/2023: LVEF 60 to 65%.  Native valve, calcified trileaflet, severe / critical aortic stenosis (max velocity noted at RSB peak velocity 5.46m/s, peak gradient , mean gradient , AVA VTI 0.73cm2, DI 0.21)..  See report for additional details  04/02/2023 (fingerprint echo status post TAVR.): LVEF 60 to 65%. Mild LVH. Grade 1 diastolic dysfunction. Right ventricular size and function normal. S/p 23 mm Edwards S3UR. no significant PVL. V max 2.3 m/s, mean graduent 12 mmHg. DI 0.48. Aortic valve regurgitation is not visualized. Echo findings are consistent with normal structure and function of the aortic valve prosthesis.  Estimated right atrial pressure of 3 mmHg.   02/2024: LVEF >75%, indeterminate diastolic function, RV size and function normal, mild MR, S/p TAVR (23  mm Celestia MOLDER; procedure date 02/25/23) Peak and mean  gradients through the valve are 28 and 18 mm Hg DVI is 0.40  Stress Testing: Exercise Myoview stress test 08/06/2021: Low risk study.  See report for additional details   Heart Catheterization: October 2024: Mild to moderate non-obstructive CAD Normal right heart pressures Recommendations: Continue workup for TAVR. Medical management of CAD.    Carotid duplex: 05/05/2019 (external): No hemodynamically significant stenosis or aberrant flow on either side, normal antegrade flow bilaterally.  Risk Assessment/Calculations:   NA    Labs:       Latest Ref Rng & Units 02/26/2023    5:25 AM 02/25/2023   12:18 PM 02/25/2023   12:13 PM  CBC  WBC 4.0 - 10.5 K/uL 11.1     Hemoglobin 12.0 - 15.0 g/dL 9.3  9.9  9.2   Hematocrit 36.0 - 46.0 % 28.6  29.0  27.0   Platelets 150 - 400 K/uL 244          Latest Ref Rng & Units 03/05/2023    3:31 PM 02/26/2023    5:25 AM 02/25/2023   12:18 PM  BMP  Glucose 70 - 99 mg/dL 85  891    BUN 8 - 27 mg/dL 7  11    Creatinine 9.42 - 1.00 mg/dL 9.16  9.22    BUN/Creat Ratio 12 - 28 8     Sodium 134 - 144 mmol/L 139  136  138   Potassium 3.5 - 5.2 mmol/L 4.0  3.6  3.8   Chloride 96 - 106 mmol/L 102  98    CO2 20 - 29 mmol/L 24  23    Calcium  8.7 - 10.3 mg/dL 9.4  89.5        Latest Ref Rng & Units 03/05/2023    3:31 PM 02/26/2023    5:25 AM 02/25/2023   12:18 PM  CMP  Glucose 70 - 99 mg/dL 85  891    BUN 8 - 27 mg/dL 7  11    Creatinine 9.42 - 1.00 mg/dL 9.16  9.22    Sodium 865 - 144 mmol/L 139  136  138   Potassium 3.5 - 5.2 mmol/L 4.0  3.6  3.8   Chloride 96 - 106 mmol/L 102  98    CO2 20 - 29 mmol/L 24  23    Calcium  8.7 - 10.3 mg/dL 9.4  89.5      Lab Results  Component Value Date   CHOL 132 05/17/2022   HDL 35 (L) 05/17/2022   LDLCALC 71 05/17/2022   TRIG 129 05/17/2022   CHOLHDL 3.8 05/17/2022   No results for input(s): LIPOA in the last 8760 hours. No components found for: NTPROBNP No results for input(s):  PROBNP in the last 8760 hours. No results for input(s): TSH in the last 8760 hours.  Physical Exam:    Today's Vitals   03/31/24 0941  BP: 128/76  Pulse: 78  Resp: 10  Temp: 98 F (36.7 C)  SpO2: 97%  Weight: 176 lb (79.8 kg)  Height: 5' 4 (1.626 m)    Body mass index is 30.21 kg/m. Wt Readings from Last 3 Encounters:  03/31/24 176 lb (79.8 kg)  03/04/24 173 lb 6.4 oz (78.7 kg)  05/13/23 162 lb 1.6 oz (73.5 kg)    Physical Exam  Constitutional: No distress.  Age appropriate, hemodynamically stable.   Neck: No JVD present.  Cardiovascular: Normal rate, regular rhythm, S1 normal, S2 normal, intact  distal pulses and normal pulses. Exam reveals no gallop, no S3 and no S4.  Murmur heard. Crescendo-decrescendo midsystolic murmur is present with a grade of 3/6 at the upper right sternal border. Pulmonary/Chest: Effort normal and breath sounds normal. No stridor. She has no wheezes. She has no rales.  Abdominal: Soft. Bowel sounds are normal. She exhibits no distension. There is no abdominal tenderness.  Musculoskeletal:        General: No edema.     Cervical back: Neck supple.  Neurological: She is alert and oriented to person, place, and time. She has intact cranial nerves (2-12).  Skin: Skin is warm and moist.     Impression & Recommendation(s):  Impression:   ICD-10-CM   1. Aortic valve disease  I35.9 EKG 12-Lead    2. S/P TAVR (transcatheter aortic valve replacement)  Z95.2     3. Benign hypertension  I10     4. Type 2 diabetes mellitus without complication, without long-term current use of insulin  (HCC)  E11.9     5. Dyslipidemia  E78.5         Recommendation(s):  Aortic valve disease S/P TAVR (transcatheter aortic valve replacement) History of critical aortic stenosis, per echocardiogram. Native aortic valve calcium  score 2493 Status post Edwards SAPIEN 3 ultra THV 23 mm implant on 02/25/2023 Fingerprint echo from December 2024 independently  reviewed. Patient is aware of antibiotic prophylaxis. Continue aspirin  81 mg p.o. daily. Echo 02/2024: LVEF >75%, status post TAVR, peak gradient 28 mmHg, mean gradient 18 mmHg, DVI 0.40 Given her hyperdynamic LVEF will increase Toprol -XL from 25 mg p.o. daily to 50 mg p.o. every morning Decrease amlodipine  from 10 mg p.o. daily to 5 mg p.o. every morning  Benign hypertension Office blood pressures are well-controlled. Medications changes as mentioned above. Decrease amlodipine  to 5 mg p.o. every morning. Increase Toprol -XL to 50 mg p.o. every morning. Continue losartan  50 mg p.o. every afternoon Reemphasized importance of low-salt diet.  Type 2 diabetes mellitus without complication, without long-term current use of insulin  (HCC) Reemphasized importance of glycemic control. Currently on ARB, statin. Last hemoglobin A1c 6.6 as of May 2025, [facility] database  Dyslipidemia Currently on Lipitor 10 mg p.o. daily.   She denies myalgia or other side effects. Most recent lipids dated December 2024, independently reviewed as noted above.  LDL 77 mg/dL Patient plans to repeat her lipids with PCP in the coming weeks  Discussed management of at least two chronic comorbid conditions.  I have independently interpreted results of the EKG 03/31/2024, labs dated 08/2023 [facility] database, Echo 02/2024, and reviewed the above mentioned results under studies reviewed as part of medical decision making.  Patient education provided.  Prescription drug management as discussed above.  I have ordered unique test I have independently created an assessment and plan as outline above and discussed it with Jacqueline Rich. The information I have obtained and assessment I have made will be shared with the patient's primary care provider to coordinate care.  Jacqueline Rich will continue to follow her primary care provider, Varadarajan, Rupashree, MD.   Orders Placed:  Orders Placed This Encounter   Procedures   EKG 12-Lead   Final Medication List:   No orders of the defined types were placed in this encounter.   There are no discontinued medications.    Current Outpatient Medications:    amLODipine  (NORVASC ) 10 MG tablet, Take 1 tablet (10 mg total) by mouth daily., Disp: 90 tablet, Rfl: 3   amoxicillin  (AMOXIL )  500 MG tablet, Take 4 tablets (2,000 mg total) by mouth as directed. 1 hour prior to dental work including cleanings, Disp: 12 tablet, Rfl: 12   aspirin  81 MG chewable tablet, Chew 1 tablet (81 mg total) by mouth daily., Disp: , Rfl:    atorvastatin  (LIPITOR) 10 MG tablet, Take 10 mg by mouth in the morning., Disp: , Rfl:    Carboxymethylcellulose Sod PF (THERATEARS PF) 0.25 % SOLN, Place 1-2 drops into both eyes 3 (three) times daily as needed (dry/irritated eyes.)., Disp: , Rfl:    cyanocobalamin (VITAMIN B12) 1000 MCG tablet, Take 1,000 mcg by mouth in the morning., Disp: , Rfl:    glimepiride  (AMARYL ) 1 MG tablet, Take 1 mg by mouth in the morning., Disp: , Rfl:    lansoprazole (PREVACID) 30 MG capsule, Take 30 mg by mouth daily before breakfast., Disp: , Rfl:    losartan  (COZAAR ) 50 MG tablet, Take 50 mg by mouth daily at 10 pm., Disp: , Rfl:    metoprolol  succinate (TOPROL -XL) 25 MG 24 hr tablet, Take 25 mg by mouth every morning., Disp: , Rfl:    potassium chloride  SA (KLOR-CON ) 20 MEQ tablet, Take 20 mEq by mouth in the morning., Disp: , Rfl:    sodium chloride  (OCEAN) 0.65 % SOLN nasal spray, Place 1 spray into both nostrils as needed for congestion., Disp: , Rfl:   Consent:   NA   Disposition:   1 year follow-up sooner if needed  Her questions and concerns were addressed to her satisfaction. She voices understanding of the recommendations provided during this encounter.    Signed, Madonna Michele HAS, [facility]  A Division of [facility] [address] [place], [place] [address]  03/31/2024 9:45 AM

## 2024-03-31 NOTE — Patient Instructions (Signed)
 Medication Instructions:  Increase: Toprol  XL to 50 mg once daily in the AM  Decrease: Amlodipine  to 5 mg once daily in the AM  Please take your Losartan  in the evening   Lab Work: None   Follow-Up: At [facility], you and your health needs are our priority.  As part of our continuing mission to provide you with exceptional heart care, our providers are all part of one team.  This team includes your primary Cardiologist (physician) and Advanced Practice Providers or APPs (Physician Assistants and Nurse Practitioners) who all work together to provide you with the care you need, when you need it.  Your next appointment:   1 year(s)  Provider:   Madonna Large, DO

## 2024-04-02 ENCOUNTER — Ambulatory Visit (INDEPENDENT_AMBULATORY_CARE_PROVIDER_SITE_OTHER): Payer: Medicare Other | Admitting: Otolaryngology

## 2024-04-02 ENCOUNTER — Encounter (INDEPENDENT_AMBULATORY_CARE_PROVIDER_SITE_OTHER): Payer: Self-pay | Admitting: Otolaryngology

## 2024-04-02 VITALS — BP 128/65 | HR 78 | Temp 98.3°F | Ht 64.0 in | Wt 165.0 lb

## 2024-04-02 DIAGNOSIS — H6123 Impacted cerumen, bilateral: Secondary | ICD-10-CM | POA: Insufficient documentation

## 2024-04-02 DIAGNOSIS — D3701 Neoplasm of uncertain behavior of lip: Secondary | ICD-10-CM

## 2024-04-02 DIAGNOSIS — J358 Other chronic diseases of tonsils and adenoids: Secondary | ICD-10-CM

## 2024-04-02 NOTE — Progress Notes (Signed)
 Patient ID: Jacqueline Rich, female   DOB: 10-22-1940, 83 y.o.   MRN: [id number]  Follow-up: Right tonsillar cyst  HPI: The patient is an 83 year old female who returns today for her follow-up evaluation.  She was last seen 1 year ago.  At that time, she was noted to have a stable 1 cm right tonsillar cyst.  No other suspicious mass or lesion was noted.  The treatment options were discussed.  The patient elected to proceed with conservative observation.  According to the patient, she has not noted any change in her tonsillar cyst.  She denies any dysphagia, odynophagia, dysphonia, or dyspnea.  She has noted occasional bad taste in her throat.  She has no other complaint today.  Exam: General: Communicates without difficulty, well nourished, no acute distress. Head: Normocephalic, no evidence injury, no tenderness, facial buttresses intact without stepoff. Face/sinus: No tenderness to palpation and percussion. Facial movement is normal and symmetric. Eyes: PERRL, EOMI. No scleral icterus, conjunctivae clear. Neuro: CN II exam reveals vision grossly intact.  No nystagmus at any point of gaze. Ears: Auricles well formed without lesions.  Bilateral cerumen impaction.  Nose: External evaluation reveals normal support and skin without lesions.  Dorsum is intact.  Anterior rhinoscopy reveals pink mucosa over anterior aspect of inferior turbinates and intact septum.  No purulence noted. Oral: 1 cm right tonsillar cyst.  The cyst has a smooth surface without any ulceration. Neck: Full range of motion without pain.  There is no significant lymphadenopathy.  No masses palpable.  Thyroid bed within normal limits to palpation.  Parotid glands and submandibular glands equal bilaterally without mass.  Trachea is midline. Neuro:  CN 2-12 grossly intact. Gait normal.   Procedure: Bilateral cerumen disimpaction Anesthesia: None Description: Under the operating microscope, the cerumen is carefully removed with a  combination of cerumen currette, alligator forceps, and suction catheters.  After the cerumen is removed, the TMs are noted to be normal.  No mass, erythema, or lesions. The patient tolerated the procedure well.    Assessment 1.  The patient has a stable 1 cm right tonsillar cyst. The cyst has a smooth surface without any ulceration.  2.  Bilateral cerumen impaction.  Plan 1.  The physical exam findings are reviewed with the patient. 2.  The treatment options are again discussed.  Options include conservative observation versus surgical excision. 3.  The patient would like to continue with conservative observation. 4.  Otomicroscopy with bilateral cerumen disimpaction.   5.  The patient is encouraged to call with any questions or concerns.

## 2024-04-04 ENCOUNTER — Encounter: Payer: Self-pay | Admitting: Cardiology

## 2024-04-12 DIAGNOSIS — Z0000 Encounter for general adult medical examination without abnormal findings: Secondary | ICD-10-CM | POA: Diagnosis not present

## 2024-04-12 DIAGNOSIS — E1169 Type 2 diabetes mellitus with other specified complication: Secondary | ICD-10-CM | POA: Diagnosis not present

## 2024-04-12 DIAGNOSIS — Z7189 Other specified counseling: Secondary | ICD-10-CM | POA: Diagnosis not present

## 2024-04-12 DIAGNOSIS — I10 Essential (primary) hypertension: Secondary | ICD-10-CM | POA: Diagnosis not present

## 2024-04-12 DIAGNOSIS — E785 Hyperlipidemia, unspecified: Secondary | ICD-10-CM | POA: Diagnosis not present

## 2024-04-12 DIAGNOSIS — Z1331 Encounter for screening for depression: Secondary | ICD-10-CM | POA: Diagnosis not present

## 2024-04-12 DIAGNOSIS — Z136 Encounter for screening for cardiovascular disorders: Secondary | ICD-10-CM | POA: Diagnosis not present

## 2024-04-12 DIAGNOSIS — I2510 Atherosclerotic heart disease of native coronary artery without angina pectoris: Secondary | ICD-10-CM | POA: Diagnosis not present

## 2024-04-14 ENCOUNTER — Telehealth: Payer: Self-pay | Admitting: Cardiology

## 2024-04-14 NOTE — Telephone Encounter (Signed)
 Pt c/o medication issue:   1. Name of Medication: losartan  (COZAAR ) 50 MG tablet    2. How are you currently taking this medication (dosage and times per day)?    3. Are you having a reaction (difficulty breathing--STAT)? No   4. What is your medication issue? Pt confused about dosage

## 2024-04-14 NOTE — Telephone Encounter (Signed)
 PT states that she has been more tired since she started taking her Losartan  at night. She said she has been doing that for a week. Denies any other symptoms. Instructed patient to continue on with current medication instructions and I will reach out to Dr Michele for advisement.

## 2024-04-14 NOTE — Telephone Encounter (Signed)
 We did not change the dose of losartan , please verify with the patient. If she feels that she would do better taking the losartan  in the morning I am okay with that. What are her blood pressures and pulses been ranging since changing medications?  Dr. Infant Zink

## 2024-04-14 NOTE — Telephone Encounter (Signed)
 Spoke with patient. Bps the past few days as follows:  Saturday 117/54 HR 71 Sunday 140/75 HR73 (States she was out running around all day) Monday 129/68 HR 79 Last night Bp-138/69 HR 75  States she would feel better starting to take her losartan  in the morning. Told her I would send this over to Dr Michele and if he had any additional changes we would reach back out. Pt stated thanks and understanding.

## 2024-04-15 NOTE — Telephone Encounter (Signed)
 BP and pulse looks very good.  If she wants to take Losartan  in the morning - that's fine.   Dr. Mako Pelfrey

## 2024-06-04 ENCOUNTER — Ambulatory Visit (HOSPITAL_COMMUNITY)

## 2024-06-04 ENCOUNTER — Ambulatory Visit: Payer: Self-pay | Admitting: Physician Assistant

## 2024-06-04 DIAGNOSIS — Z952 Presence of prosthetic heart valve: Secondary | ICD-10-CM

## 2024-06-04 LAB — ECHOCARDIOGRAM COMPLETE
AR max vel: 1.17 cm2
AV Area VTI: 1.23 cm2
AV Area mean vel: 1.18 cm2
AV Mean grad: 14 mmHg
AV Peak grad: 26.2 mmHg
Ao pk vel: 2.56 m/s
Area-P 1/2: 2.32 cm2
S' Lateral: 2 cm
